# Patient Record
Sex: Female | Born: 1940 | Race: White | Hispanic: No | Marital: Married | Smoking: Never smoker
Health system: Southern US, Community
[De-identification: ages and names within clinical notes are randomized; demographics above are authoritative.]

## PROBLEM LIST (undated history)

## (undated) DIAGNOSIS — R51 Headache: Secondary | ICD-10-CM

## (undated) DIAGNOSIS — R590 Localized enlarged lymph nodes: Principal | ICD-10-CM

## (undated) DIAGNOSIS — I341 Nonrheumatic mitral (valve) prolapse: Secondary | ICD-10-CM

## (undated) DIAGNOSIS — C801 Malignant (primary) neoplasm, unspecified: Secondary | ICD-10-CM

## (undated) DIAGNOSIS — L309 Dermatitis, unspecified: Secondary | ICD-10-CM

## (undated) DIAGNOSIS — E039 Hypothyroidism, unspecified: Secondary | ICD-10-CM

## (undated) DIAGNOSIS — K5790 Diverticulosis of intestine, part unspecified, without perforation or abscess without bleeding: Secondary | ICD-10-CM

## (undated) DIAGNOSIS — K635 Polyp of colon: Secondary | ICD-10-CM

## (undated) DIAGNOSIS — IMO0001 Reserved for inherently not codable concepts without codable children: Secondary | ICD-10-CM

## (undated) DIAGNOSIS — Z8719 Personal history of other diseases of the digestive system: Secondary | ICD-10-CM

## (undated) DIAGNOSIS — M1990 Unspecified osteoarthritis, unspecified site: Secondary | ICD-10-CM

## (undated) DIAGNOSIS — R591 Generalized enlarged lymph nodes: Principal | ICD-10-CM

## (undated) DIAGNOSIS — I10 Essential (primary) hypertension: Secondary | ICD-10-CM

## (undated) DIAGNOSIS — T7840XA Allergy, unspecified, initial encounter: Secondary | ICD-10-CM

## (undated) DIAGNOSIS — M797 Fibromyalgia: Secondary | ICD-10-CM

## (undated) DIAGNOSIS — Z5189 Encounter for other specified aftercare: Secondary | ICD-10-CM

## (undated) DIAGNOSIS — N3010 Interstitial cystitis (chronic) without hematuria: Secondary | ICD-10-CM

## (undated) DIAGNOSIS — C8303 Small cell B-cell lymphoma, intra-abdominal lymph nodes: Principal | ICD-10-CM

## (undated) DIAGNOSIS — K219 Gastro-esophageal reflux disease without esophagitis: Secondary | ICD-10-CM

## (undated) DIAGNOSIS — Z9221 Personal history of antineoplastic chemotherapy: Secondary | ICD-10-CM

## (undated) DIAGNOSIS — K279 Peptic ulcer, site unspecified, unspecified as acute or chronic, without hemorrhage or perforation: Secondary | ICD-10-CM

## (undated) DIAGNOSIS — E7800 Pure hypercholesterolemia, unspecified: Secondary | ICD-10-CM

## (undated) HISTORY — DX: Peptic ulcer, site unspecified, unspecified as acute or chronic, without hemorrhage or perforation: K27.9

## (undated) HISTORY — PX: EYE SURGERY: SHX253

## (undated) HISTORY — DX: Reserved for inherently not codable concepts without codable children: IMO0001

## (undated) HISTORY — PX: TUBAL LIGATION: SHX77

## (undated) HISTORY — DX: Interstitial cystitis (chronic) without hematuria: N30.10

## (undated) HISTORY — DX: Malignant (primary) neoplasm, unspecified: C80.1

## (undated) HISTORY — DX: Pure hypercholesterolemia, unspecified: E78.00

## (undated) HISTORY — DX: Gastro-esophageal reflux disease without esophagitis: K21.9

## (undated) HISTORY — DX: Allergy, unspecified, initial encounter: T78.40XA

## (undated) HISTORY — DX: Small cell b-cell lymphoma, intra-abdominal lymph nodes: C83.03

## (undated) HISTORY — PX: BREAST LUMPECTOMY: SHX2

## (undated) HISTORY — DX: Personal history of antineoplastic chemotherapy: Z92.21

## (undated) HISTORY — DX: Encounter for other specified aftercare: Z51.89

## (undated) HISTORY — DX: Diverticulosis of intestine, part unspecified, without perforation or abscess without bleeding: K57.90

## (undated) HISTORY — DX: Unspecified osteoarthritis, unspecified site: M19.90

## (undated) HISTORY — PX: OTHER SURGICAL HISTORY: SHX169

## (undated) HISTORY — DX: Essential (primary) hypertension: I10

## (undated) HISTORY — PX: INNER EAR SURGERY: SHX679

## (undated) HISTORY — PX: TONSILLECTOMY: SUR1361

## (undated) HISTORY — PX: APPENDECTOMY: SHX54

## (undated) HISTORY — DX: Generalized enlarged lymph nodes: R59.1

## (undated) HISTORY — PX: HYSTEROSCOPY WITH D & C: SHX1775

## (undated) HISTORY — PX: DILATION AND CURETTAGE OF UTERUS: SHX78

## (undated) HISTORY — PX: COLONOSCOPY W/ BIOPSIES AND POLYPECTOMY: SHX1376

## (undated) HISTORY — DX: Localized enlarged lymph nodes: R59.0

## (undated) HISTORY — DX: Polyp of colon: K63.5

## (undated) HISTORY — PX: CATARACT EXTRACTION W/ INTRAOCULAR LENS  IMPLANT, BILATERAL: SHX1307

## (undated) HISTORY — DX: Nonrheumatic mitral (valve) prolapse: I34.1

## (undated) HISTORY — DX: Fibromyalgia: M79.7

## (undated) HISTORY — DX: Hypothyroidism, unspecified: E03.9

---

## 1983-05-18 DIAGNOSIS — IMO0001 Reserved for inherently not codable concepts without codable children: Secondary | ICD-10-CM | POA: Insufficient documentation

## 1997-11-14 DIAGNOSIS — K219 Gastro-esophageal reflux disease without esophagitis: Secondary | ICD-10-CM

## 1998-08-11 ENCOUNTER — Ambulatory Visit (HOSPITAL_BASED_OUTPATIENT_CLINIC_OR_DEPARTMENT_OTHER): Admission: RE | Admit: 1998-08-11 | Discharge: 1998-08-11 | Payer: Self-pay | Admitting: Otolaryngology

## 1998-10-16 DIAGNOSIS — E785 Hyperlipidemia, unspecified: Secondary | ICD-10-CM

## 1998-11-06 ENCOUNTER — Other Ambulatory Visit: Admission: RE | Admit: 1998-11-06 | Discharge: 1998-11-06 | Payer: Self-pay | Admitting: Family Medicine

## 1999-06-17 ENCOUNTER — Encounter: Payer: Self-pay | Admitting: Gastroenterology

## 2000-01-08 ENCOUNTER — Other Ambulatory Visit: Admission: RE | Admit: 2000-01-08 | Discharge: 2000-01-08 | Payer: Self-pay | Admitting: Family Medicine

## 2000-03-17 DIAGNOSIS — M810 Age-related osteoporosis without current pathological fracture: Secondary | ICD-10-CM | POA: Insufficient documentation

## 2001-11-20 ENCOUNTER — Encounter: Payer: Self-pay | Admitting: Family Medicine

## 2001-11-20 ENCOUNTER — Encounter: Admission: RE | Admit: 2001-11-20 | Discharge: 2001-11-20 | Payer: Self-pay | Admitting: Family Medicine

## 2002-10-16 ENCOUNTER — Encounter (INDEPENDENT_AMBULATORY_CARE_PROVIDER_SITE_OTHER): Payer: Self-pay | Admitting: Internal Medicine

## 2002-10-16 ENCOUNTER — Other Ambulatory Visit: Admission: RE | Admit: 2002-10-16 | Discharge: 2002-10-16 | Payer: Self-pay | Admitting: Internal Medicine

## 2003-03-28 ENCOUNTER — Ambulatory Visit (HOSPITAL_COMMUNITY): Admission: RE | Admit: 2003-03-28 | Discharge: 2003-03-28 | Payer: Self-pay | Admitting: Gastroenterology

## 2004-06-02 ENCOUNTER — Ambulatory Visit: Payer: Self-pay | Admitting: Family Medicine

## 2004-06-16 ENCOUNTER — Ambulatory Visit: Payer: Self-pay | Admitting: Ophthalmology

## 2004-12-18 ENCOUNTER — Ambulatory Visit: Payer: Self-pay | Admitting: Family Medicine

## 2005-01-11 ENCOUNTER — Ambulatory Visit: Payer: Self-pay | Admitting: Family Medicine

## 2005-05-27 ENCOUNTER — Ambulatory Visit: Payer: Self-pay | Admitting: Family Medicine

## 2005-07-29 ENCOUNTER — Ambulatory Visit: Payer: Self-pay | Admitting: Family Medicine

## 2005-09-20 ENCOUNTER — Ambulatory Visit: Payer: Self-pay | Admitting: Family Medicine

## 2005-12-10 ENCOUNTER — Ambulatory Visit: Payer: Self-pay | Admitting: Family Medicine

## 2006-01-04 ENCOUNTER — Ambulatory Visit: Payer: Self-pay | Admitting: Family Medicine

## 2006-03-08 ENCOUNTER — Ambulatory Visit: Payer: Self-pay | Admitting: Family Medicine

## 2006-06-03 ENCOUNTER — Ambulatory Visit: Payer: Self-pay | Admitting: Family Medicine

## 2006-06-03 ENCOUNTER — Encounter (INDEPENDENT_AMBULATORY_CARE_PROVIDER_SITE_OTHER): Payer: Self-pay | Admitting: Internal Medicine

## 2006-06-03 LAB — CONVERTED CEMR LAB
Anti Nuclear Antibody(ANA): NEGATIVE
Folate: 17.6 ng/mL
Sed Rate: 10 mm/hr (ref 0–25)
TSH: 1.25 microintl units/mL (ref 0.35–5.50)

## 2006-06-07 ENCOUNTER — Ambulatory Visit: Payer: Self-pay | Admitting: Gastroenterology

## 2006-06-08 ENCOUNTER — Ambulatory Visit: Payer: Self-pay | Admitting: Gastroenterology

## 2006-06-08 ENCOUNTER — Encounter (INDEPENDENT_AMBULATORY_CARE_PROVIDER_SITE_OTHER): Payer: Self-pay | Admitting: Specialist

## 2006-06-08 DIAGNOSIS — K209 Esophagitis, unspecified without bleeding: Secondary | ICD-10-CM | POA: Insufficient documentation

## 2006-06-08 DIAGNOSIS — K222 Esophageal obstruction: Secondary | ICD-10-CM

## 2006-07-11 ENCOUNTER — Ambulatory Visit: Payer: Self-pay | Admitting: Gastroenterology

## 2006-07-11 ENCOUNTER — Ambulatory Visit (HOSPITAL_COMMUNITY): Admission: RE | Admit: 2006-07-11 | Discharge: 2006-07-11 | Payer: Self-pay | Admitting: Gastroenterology

## 2006-07-19 ENCOUNTER — Ambulatory Visit (HOSPITAL_COMMUNITY): Admission: RE | Admit: 2006-07-19 | Discharge: 2006-07-19 | Payer: Self-pay | Admitting: Gastroenterology

## 2006-07-21 ENCOUNTER — Encounter: Payer: Self-pay | Admitting: Cardiology

## 2006-07-21 ENCOUNTER — Ambulatory Visit: Payer: Self-pay

## 2006-08-10 ENCOUNTER — Ambulatory Visit: Payer: Self-pay | Admitting: Gastroenterology

## 2006-08-10 DIAGNOSIS — K3184 Gastroparesis: Secondary | ICD-10-CM | POA: Insufficient documentation

## 2006-08-22 ENCOUNTER — Ambulatory Visit: Payer: Self-pay | Admitting: Gynecology

## 2006-08-26 ENCOUNTER — Ambulatory Visit: Payer: Self-pay | Admitting: Family Medicine

## 2006-08-29 ENCOUNTER — Encounter: Admission: RE | Admit: 2006-08-29 | Discharge: 2006-08-29 | Payer: Self-pay | Admitting: Gynecology

## 2006-09-27 ENCOUNTER — Ambulatory Visit: Payer: Self-pay | Admitting: Family Medicine

## 2006-09-27 DIAGNOSIS — E039 Hypothyroidism, unspecified: Secondary | ICD-10-CM | POA: Insufficient documentation

## 2006-10-12 ENCOUNTER — Ambulatory Visit: Payer: Self-pay | Admitting: Gastroenterology

## 2006-10-12 LAB — CONVERTED CEMR LAB
AST: 18 units/L (ref 0–37)
Albumin: 4.1 g/dL (ref 3.5–5.2)
Basophils Absolute: 0.1 10*3/uL (ref 0.0–0.1)
CO2: 30 meq/L (ref 19–32)
Creatinine, Ser: 0.7 mg/dL (ref 0.4–1.2)
GFR calc Af Amer: 109 mL/min
Glucose, Bld: 122 mg/dL — ABNORMAL HIGH (ref 70–99)
Hemoglobin: 13.8 g/dL (ref 12.0–15.0)
MCHC: 35 g/dL (ref 30.0–36.0)
Monocytes Absolute: 0.7 10*3/uL (ref 0.2–0.7)
Monocytes Relative: 7.3 % (ref 3.0–11.0)
RBC: 4.28 M/uL (ref 3.87–5.11)
RDW: 12.5 % (ref 11.5–14.6)
Sodium: 143 meq/L (ref 135–145)
Total Bilirubin: 0.8 mg/dL (ref 0.3–1.2)

## 2006-11-10 ENCOUNTER — Ambulatory Visit: Payer: Self-pay | Admitting: Family Medicine

## 2006-11-10 DIAGNOSIS — E7800 Pure hypercholesterolemia, unspecified: Secondary | ICD-10-CM | POA: Insufficient documentation

## 2006-11-16 LAB — CONVERTED CEMR LAB
Cholesterol: 243 mg/dL (ref 0–200)
HDL: 48.6 mg/dL (ref >39.0)
Total CHOL/HDL Ratio: 5
Triglycerides: 201 mg/dL (ref 0–149)
VLDL: 40 mg/dL (ref 0–40)

## 2006-12-26 ENCOUNTER — Ambulatory Visit: Payer: Self-pay | Admitting: Gynecology

## 2007-01-26 ENCOUNTER — Encounter (INDEPENDENT_AMBULATORY_CARE_PROVIDER_SITE_OTHER): Payer: Self-pay | Admitting: *Deleted

## 2007-01-26 ENCOUNTER — Ambulatory Visit: Payer: Self-pay | Admitting: Family Medicine

## 2007-01-26 LAB — CONVERTED CEMR LAB
Bacteria, UA: 0
Bilirubin Urine: NEGATIVE
Nitrite: NEGATIVE
Protein, U semiquant: NEGATIVE
RBC / HPF: 0
Specific Gravity, Urine: 1.025

## 2007-02-28 ENCOUNTER — Ambulatory Visit: Payer: Self-pay | Admitting: Family Medicine

## 2007-05-23 ENCOUNTER — Ambulatory Visit: Payer: Self-pay | Admitting: Obstetrics & Gynecology

## 2007-05-26 ENCOUNTER — Ambulatory Visit (HOSPITAL_COMMUNITY): Admission: RE | Admit: 2007-05-26 | Discharge: 2007-05-26 | Payer: Self-pay | Admitting: Gynecology

## 2007-05-29 ENCOUNTER — Telehealth (INDEPENDENT_AMBULATORY_CARE_PROVIDER_SITE_OTHER): Payer: Self-pay | Admitting: *Deleted

## 2007-05-30 ENCOUNTER — Ambulatory Visit: Payer: Self-pay | Admitting: Family Medicine

## 2007-05-30 ENCOUNTER — Encounter: Payer: Self-pay | Admitting: Obstetrics & Gynecology

## 2007-06-13 ENCOUNTER — Ambulatory Visit: Payer: Self-pay | Admitting: Family Medicine

## 2007-06-13 DIAGNOSIS — M129 Arthropathy, unspecified: Secondary | ICD-10-CM | POA: Insufficient documentation

## 2007-06-14 LAB — CONVERTED CEMR LAB: TSH: 0.5 microintl units/mL (ref 0.35–5.50)

## 2007-06-21 ENCOUNTER — Encounter: Payer: Self-pay | Admitting: Internal Medicine

## 2007-06-21 DIAGNOSIS — H9190 Unspecified hearing loss, unspecified ear: Secondary | ICD-10-CM | POA: Insufficient documentation

## 2007-06-21 DIAGNOSIS — C189 Malignant neoplasm of colon, unspecified: Secondary | ICD-10-CM | POA: Insufficient documentation

## 2007-06-26 ENCOUNTER — Ambulatory Visit: Payer: Self-pay | Admitting: Obstetrics & Gynecology

## 2007-07-12 ENCOUNTER — Ambulatory Visit: Payer: Self-pay | Admitting: Family Medicine

## 2007-07-12 ENCOUNTER — Telehealth (INDEPENDENT_AMBULATORY_CARE_PROVIDER_SITE_OTHER): Payer: Self-pay | Admitting: Internal Medicine

## 2007-07-13 ENCOUNTER — Ambulatory Visit: Payer: Self-pay | Admitting: Family Medicine

## 2007-07-13 DIAGNOSIS — H60399 Other infective otitis externa, unspecified ear: Secondary | ICD-10-CM | POA: Insufficient documentation

## 2007-07-13 LAB — CONVERTED CEMR LAB: TSH: 1.94 microintl units/mL (ref 0.35–5.50)

## 2007-07-18 ENCOUNTER — Ambulatory Visit: Payer: Self-pay | Admitting: Family Medicine

## 2007-07-18 ENCOUNTER — Ambulatory Visit: Payer: Self-pay | Admitting: Obstetrics & Gynecology

## 2007-07-18 ENCOUNTER — Ambulatory Visit (HOSPITAL_COMMUNITY): Admission: RE | Admit: 2007-07-18 | Discharge: 2007-07-18 | Payer: Self-pay | Admitting: Obstetrics & Gynecology

## 2007-07-18 ENCOUNTER — Encounter (INDEPENDENT_AMBULATORY_CARE_PROVIDER_SITE_OTHER): Payer: Self-pay | Admitting: Internal Medicine

## 2007-07-18 ENCOUNTER — Encounter: Payer: Self-pay | Admitting: Obstetrics & Gynecology

## 2007-08-14 ENCOUNTER — Ambulatory Visit: Payer: Self-pay | Admitting: Obstetrics & Gynecology

## 2007-10-31 DIAGNOSIS — K5732 Diverticulitis of large intestine without perforation or abscess without bleeding: Secondary | ICD-10-CM | POA: Insufficient documentation

## 2007-12-08 ENCOUNTER — Ambulatory Visit: Payer: Self-pay | Admitting: Family Medicine

## 2008-03-07 ENCOUNTER — Ambulatory Visit: Payer: Self-pay | Admitting: Family Medicine

## 2008-07-01 ENCOUNTER — Ambulatory Visit: Payer: Self-pay | Admitting: Unknown Physician Specialty

## 2008-07-02 ENCOUNTER — Ambulatory Visit: Payer: Self-pay | Admitting: Family Medicine

## 2008-07-02 DIAGNOSIS — D126 Benign neoplasm of colon, unspecified: Secondary | ICD-10-CM

## 2008-07-09 ENCOUNTER — Telehealth (INDEPENDENT_AMBULATORY_CARE_PROVIDER_SITE_OTHER): Payer: Self-pay | Admitting: Internal Medicine

## 2008-07-10 ENCOUNTER — Ambulatory Visit: Payer: Self-pay | Admitting: Internal Medicine

## 2008-07-11 ENCOUNTER — Encounter (INDEPENDENT_AMBULATORY_CARE_PROVIDER_SITE_OTHER): Payer: Self-pay | Admitting: Internal Medicine

## 2008-07-11 ENCOUNTER — Telehealth (INDEPENDENT_AMBULATORY_CARE_PROVIDER_SITE_OTHER): Payer: Self-pay | Admitting: Internal Medicine

## 2008-07-11 LAB — CONVERTED CEMR LAB
ALT: 18 U/L
AST: 18 U/L
BUN: 10 mg/dL
CO2: 29 meq/L
Calcium: 9.2 mg/dL
Chloride: 108 meq/L
Cholesterol: 237 mg/dL
Creatinine, Ser: 0.7 mg/dL
Direct LDL: 136 mg/dL
GFR calc Af Amer: 108 mL/min
GFR calc non Af Amer: 89 mL/min
Glucose, Bld: 93 mg/dL
HDL: 52.4 mg/dL
Potassium: 4.3 meq/L
Sodium: 144 meq/L
TSH: 1.04 u[IU]/mL
Total CHOL/HDL Ratio: 4.5
Triglycerides: 229 mg/dL
VLDL: 46 mg/dL — ABNORMAL HIGH

## 2008-08-06 ENCOUNTER — Ambulatory Visit: Payer: Self-pay | Admitting: Family Medicine

## 2008-08-06 ENCOUNTER — Telehealth (INDEPENDENT_AMBULATORY_CARE_PROVIDER_SITE_OTHER): Payer: Self-pay | Admitting: Internal Medicine

## 2008-08-07 ENCOUNTER — Telehealth (INDEPENDENT_AMBULATORY_CARE_PROVIDER_SITE_OTHER): Payer: Self-pay | Admitting: Internal Medicine

## 2008-08-13 ENCOUNTER — Ambulatory Visit: Payer: Self-pay | Admitting: Unknown Physician Specialty

## 2008-10-15 ENCOUNTER — Ambulatory Visit: Payer: Self-pay | Admitting: Family Medicine

## 2008-10-15 DIAGNOSIS — M545 Low back pain, unspecified: Secondary | ICD-10-CM | POA: Insufficient documentation

## 2008-10-15 LAB — CONVERTED CEMR LAB
Bilirubin Urine: NEGATIVE
Blood in Urine, dipstick: NEGATIVE
Ketones, urine, test strip: NEGATIVE
Urobilinogen, UA: 0.2
pH: 6

## 2008-10-16 ENCOUNTER — Encounter: Payer: Self-pay | Admitting: Family Medicine

## 2008-10-16 DIAGNOSIS — R300 Dysuria: Secondary | ICD-10-CM

## 2008-10-17 ENCOUNTER — Encounter: Payer: Self-pay | Admitting: Family Medicine

## 2008-11-06 ENCOUNTER — Ambulatory Visit: Payer: Self-pay | Admitting: Internal Medicine

## 2008-11-06 DIAGNOSIS — R1310 Dysphagia, unspecified: Secondary | ICD-10-CM | POA: Insufficient documentation

## 2008-11-08 ENCOUNTER — Telehealth (INDEPENDENT_AMBULATORY_CARE_PROVIDER_SITE_OTHER): Payer: Self-pay | Admitting: Internal Medicine

## 2008-12-09 ENCOUNTER — Ambulatory Visit: Payer: Self-pay | Admitting: Gastroenterology

## 2008-12-12 ENCOUNTER — Encounter: Payer: Self-pay | Admitting: Gastroenterology

## 2008-12-12 ENCOUNTER — Ambulatory Visit: Payer: Self-pay | Admitting: Gastroenterology

## 2008-12-24 ENCOUNTER — Encounter: Payer: Self-pay | Admitting: Gastroenterology

## 2008-12-26 ENCOUNTER — Encounter: Payer: Self-pay | Admitting: Gastroenterology

## 2008-12-26 ENCOUNTER — Ambulatory Visit: Payer: Self-pay | Admitting: Gastroenterology

## 2008-12-30 ENCOUNTER — Encounter: Payer: Self-pay | Admitting: Gastroenterology

## 2008-12-31 ENCOUNTER — Ambulatory Visit: Payer: Self-pay | Admitting: Family Medicine

## 2008-12-31 DIAGNOSIS — N390 Urinary tract infection, site not specified: Secondary | ICD-10-CM

## 2008-12-31 LAB — CONVERTED CEMR LAB
Bilirubin Urine: NEGATIVE
Glucose, Urine, Semiquant: NEGATIVE
Urobilinogen, UA: 0.2

## 2009-01-01 ENCOUNTER — Encounter (INDEPENDENT_AMBULATORY_CARE_PROVIDER_SITE_OTHER): Payer: Self-pay | Admitting: Internal Medicine

## 2009-01-08 ENCOUNTER — Telehealth (INDEPENDENT_AMBULATORY_CARE_PROVIDER_SITE_OTHER): Payer: Self-pay | Admitting: Internal Medicine

## 2009-01-21 ENCOUNTER — Ambulatory Visit: Payer: Self-pay | Admitting: Family Medicine

## 2009-01-21 LAB — CONVERTED CEMR LAB
Bilirubin Urine: NEGATIVE
Glucose, Urine, Semiquant: NEGATIVE
Protein, U semiquant: 100
Specific Gravity, Urine: 1.02
pH: 6

## 2009-01-22 ENCOUNTER — Encounter (INDEPENDENT_AMBULATORY_CARE_PROVIDER_SITE_OTHER): Payer: Self-pay | Admitting: Internal Medicine

## 2009-01-28 ENCOUNTER — Telehealth (INDEPENDENT_AMBULATORY_CARE_PROVIDER_SITE_OTHER): Payer: Self-pay | Admitting: Internal Medicine

## 2009-02-07 ENCOUNTER — Ambulatory Visit: Payer: Self-pay | Admitting: Family Medicine

## 2009-02-08 ENCOUNTER — Encounter (INDEPENDENT_AMBULATORY_CARE_PROVIDER_SITE_OTHER): Payer: Self-pay | Admitting: Internal Medicine

## 2009-05-20 ENCOUNTER — Ambulatory Visit: Payer: Self-pay | Admitting: Family Medicine

## 2009-06-23 ENCOUNTER — Telehealth: Payer: Self-pay | Admitting: Gastroenterology

## 2009-06-24 ENCOUNTER — Ambulatory Visit: Payer: Self-pay | Admitting: Gastroenterology

## 2009-06-24 DIAGNOSIS — R1319 Other dysphagia: Secondary | ICD-10-CM

## 2009-06-24 DIAGNOSIS — K5730 Diverticulosis of large intestine without perforation or abscess without bleeding: Secondary | ICD-10-CM | POA: Insufficient documentation

## 2009-06-24 DIAGNOSIS — Z860100 Personal history of colon polyps, unspecified: Secondary | ICD-10-CM | POA: Insufficient documentation

## 2009-06-25 ENCOUNTER — Ambulatory Visit: Payer: Self-pay | Admitting: Pulmonary Disease

## 2009-06-25 ENCOUNTER — Telehealth (INDEPENDENT_AMBULATORY_CARE_PROVIDER_SITE_OTHER): Payer: Self-pay | Admitting: *Deleted

## 2009-06-25 DIAGNOSIS — R059 Cough, unspecified: Secondary | ICD-10-CM | POA: Insufficient documentation

## 2009-06-25 DIAGNOSIS — R05 Cough: Secondary | ICD-10-CM

## 2009-06-26 ENCOUNTER — Ambulatory Visit (HOSPITAL_COMMUNITY): Admission: RE | Admit: 2009-06-26 | Discharge: 2009-06-26 | Payer: Self-pay | Admitting: Gastroenterology

## 2009-06-27 ENCOUNTER — Telehealth: Payer: Self-pay | Admitting: Gastroenterology

## 2009-07-16 ENCOUNTER — Ambulatory Visit: Payer: Self-pay | Admitting: Gastroenterology

## 2009-08-12 ENCOUNTER — Ambulatory Visit: Payer: Self-pay | Admitting: Family Medicine

## 2009-08-12 LAB — CONVERTED CEMR LAB: Vit D, 25-Hydroxy: 28 ng/mL — ABNORMAL LOW (ref 30–89)

## 2009-08-14 ENCOUNTER — Encounter (INDEPENDENT_AMBULATORY_CARE_PROVIDER_SITE_OTHER): Payer: Self-pay | Admitting: *Deleted

## 2009-08-14 LAB — CONVERTED CEMR LAB
ALT: 20 units/L (ref 0–35)
AST: 17 units/L (ref 0–37)
BUN: 13 mg/dL (ref 6–23)
CO2: 31 meq/L (ref 19–32)
Chloride: 107 meq/L (ref 96–112)
Creatinine, Ser: 0.7 mg/dL (ref 0.4–1.2)
Potassium: 4.4 meq/L (ref 3.5–5.1)
Total Bilirubin: 0.5 mg/dL (ref 0.3–1.2)
Total CHOL/HDL Ratio: 5
Total Protein: 7 g/dL (ref 6.0–8.3)
VLDL: 62.2 mg/dL — ABNORMAL HIGH (ref 0.0–40.0)

## 2009-08-19 ENCOUNTER — Telehealth: Payer: Self-pay | Admitting: Family Medicine

## 2009-10-02 ENCOUNTER — Ambulatory Visit: Payer: Self-pay | Admitting: Family Medicine

## 2009-10-02 LAB — CONVERTED CEMR LAB
Bilirubin Urine: NEGATIVE
Glucose, Urine, Semiquant: NEGATIVE
Ketones, urine, test strip: NEGATIVE
Protein, U semiquant: NEGATIVE
pH: 6

## 2009-10-03 ENCOUNTER — Encounter: Payer: Self-pay | Admitting: Family Medicine

## 2009-10-10 ENCOUNTER — Telehealth: Payer: Self-pay | Admitting: Family Medicine

## 2009-10-14 ENCOUNTER — Telehealth: Payer: Self-pay | Admitting: Family Medicine

## 2009-10-14 ENCOUNTER — Encounter (INDEPENDENT_AMBULATORY_CARE_PROVIDER_SITE_OTHER): Payer: Self-pay | Admitting: *Deleted

## 2009-11-13 ENCOUNTER — Encounter (INDEPENDENT_AMBULATORY_CARE_PROVIDER_SITE_OTHER): Payer: Self-pay | Admitting: *Deleted

## 2009-11-24 ENCOUNTER — Telehealth (INDEPENDENT_AMBULATORY_CARE_PROVIDER_SITE_OTHER): Payer: Self-pay | Admitting: *Deleted

## 2009-11-25 ENCOUNTER — Ambulatory Visit: Payer: Self-pay | Admitting: Family Medicine

## 2009-11-25 DIAGNOSIS — E559 Vitamin D deficiency, unspecified: Secondary | ICD-10-CM | POA: Insufficient documentation

## 2009-11-27 LAB — CONVERTED CEMR LAB
AST: 21 units/L (ref 0–37)
Albumin: 4.1 g/dL (ref 3.5–5.2)
Alkaline Phosphatase: 67 units/L (ref 39–117)
Cholesterol: 242 mg/dL — ABNORMAL HIGH (ref 0–200)
Total Protein: 6.4 g/dL (ref 6.0–8.3)
Triglycerides: 190 mg/dL — ABNORMAL HIGH (ref 0.0–149.0)

## 2009-12-25 ENCOUNTER — Ambulatory Visit: Payer: Self-pay | Admitting: Family Medicine

## 2009-12-25 LAB — CONVERTED CEMR LAB
Nitrite: NEGATIVE
Protein, U semiquant: 30
Urobilinogen, UA: 0.2

## 2010-01-06 ENCOUNTER — Ambulatory Visit: Payer: Self-pay | Admitting: Family Medicine

## 2010-01-06 LAB — CONVERTED CEMR LAB
Blood in Urine, dipstick: NEGATIVE
Nitrite: NEGATIVE
Protein, U semiquant: NEGATIVE
Urobilinogen, UA: 0.2
WBC Urine, dipstick: NEGATIVE

## 2010-01-09 ENCOUNTER — Telehealth: Payer: Self-pay | Admitting: Family Medicine

## 2010-01-28 ENCOUNTER — Ambulatory Visit: Payer: Self-pay | Admitting: Family Medicine

## 2010-01-28 DIAGNOSIS — N63 Unspecified lump in unspecified breast: Secondary | ICD-10-CM

## 2010-01-30 ENCOUNTER — Encounter: Payer: Self-pay | Admitting: Family Medicine

## 2010-02-02 ENCOUNTER — Encounter: Payer: Self-pay | Admitting: Family Medicine

## 2010-02-02 ENCOUNTER — Ambulatory Visit: Payer: Self-pay | Admitting: Family Medicine

## 2010-02-04 ENCOUNTER — Telehealth: Payer: Self-pay | Admitting: Family Medicine

## 2010-02-09 ENCOUNTER — Encounter: Payer: Self-pay | Admitting: Family Medicine

## 2010-02-10 ENCOUNTER — Encounter: Payer: Self-pay | Admitting: Family Medicine

## 2010-02-10 ENCOUNTER — Ambulatory Visit: Payer: Self-pay | Admitting: Family Medicine

## 2010-02-10 DIAGNOSIS — R928 Other abnormal and inconclusive findings on diagnostic imaging of breast: Secondary | ICD-10-CM | POA: Insufficient documentation

## 2010-02-11 ENCOUNTER — Encounter: Payer: Self-pay | Admitting: Family Medicine

## 2010-02-16 ENCOUNTER — Encounter: Payer: Self-pay | Admitting: Family Medicine

## 2010-02-20 ENCOUNTER — Ambulatory Visit: Payer: Self-pay | Admitting: Rheumatology

## 2010-03-02 ENCOUNTER — Other Ambulatory Visit: Admission: RE | Admit: 2010-03-02 | Discharge: 2010-03-02 | Payer: Self-pay | Admitting: Obstetrics & Gynecology

## 2010-03-02 ENCOUNTER — Ambulatory Visit: Payer: Self-pay | Admitting: Obstetrics & Gynecology

## 2010-05-26 ENCOUNTER — Telehealth: Payer: Self-pay | Admitting: Family Medicine

## 2010-06-09 ENCOUNTER — Ambulatory Visit
Admission: RE | Admit: 2010-06-09 | Discharge: 2010-06-09 | Payer: Self-pay | Source: Home / Self Care | Attending: Family Medicine | Admitting: Family Medicine

## 2010-06-09 LAB — CONVERTED CEMR LAB
Glucose, Urine, Semiquant: NEGATIVE
Protein, U semiquant: NEGATIVE
WBC Urine, dipstick: NEGATIVE
pH: 5

## 2010-06-10 ENCOUNTER — Encounter: Payer: Self-pay | Admitting: Family Medicine

## 2010-06-16 NOTE — Progress Notes (Signed)
  Phone Note Call from Patient   Caller: Patient Call For: Ruthe Mannan MD Summary of Call: Patient never was able to take the statin  drug you put her on. Does she need to come in for labs tomorrow?Vit D 268.9; Lipid; Hepatic 272.4/lsf is what she has ordered. Thanks. # to return call [phone] Initial call taken by: Mills Koller,  November 24, 2009 8:19 AM  Follow-up for Phone Call        Yes, we need to see where her lipids are currently so we know what to try next. thanks! Ruthe Mannan MD  November 24, 2009 8:25 AM   Additional Follow-up for Phone Call Additional follow up Details #1::        Patient notified Additional Follow-up by: Mills Koller,  November 24, 2009 8:33 AM

## 2010-06-16 NOTE — Assessment & Plan Note (Signed)
Summary: F/U FROM APPT. 06-24-09 W/AMY, BA ESOPHAGRAM AND NEED FOR BRAVO...    History of Present Illness Visit Type: Follow-up Visit Primary GI MD: Melvia Heaps MD [facility] Primary Provider: Laurita Quint, MD Requesting Provider: n/a Chief Complaint: F/U abdominal pain has not changed History of Present Illness:   Amanda Kane has returned for ongoing evaluation of burning chest and abdominal discomfort.  On b.i.d. Prevacid symptoms have improved though they remain.  She has frequent breakthrough pyrosis and burning upper epigastric pain.  She has also been complaining of a chronic nonproductive cough which she introduced allergies.  Once she gets coughing it becomes a cyclical problem.  a barium swallow, which I reviewed, did not demonstrate any strictures.  She does complain of occasional dysphagia.  She was last dilated in July, 2010.  In contrast to prior dilatations, her dysphagia did not significantly improve following her last dilatation.   GI Review of Systems    Reports abdominal pain and  acid reflux.     Location of  Abdominal pain: right side.    Denies belching, bloating, chest pain, dysphagia with liquids, dysphagia with solids, heartburn, loss of appetite, nausea, vomiting, vomiting blood, weight loss, and  weight gain.      Reports change in bowel habits and  constipation.     Denies anal fissure, black tarry stools, diarrhea, diverticulosis, fecal incontinence, heme positive stool, hemorrhoids, irritable bowel syndrome, jaundice, light color stool, liver problems, rectal bleeding, and  rectal pain.    Current Medications (verified): 1)  Prozac 20 Mg  Caps (Fluoxetine Hcl) .... Take One By Mouth Every Other Day As Needed For Fibromyalgia 2)  Estrace 0.1 Mg/gm Crea (Estradiol) .Marland Kitchen.. 1 Time Per Week Vaginally 3)  Pyridium 100 Mg Tabs (Phenazopyridine Hcl) .... Per Urologist As Needed 4)  Levothyroxine Sodium 75 Mcg Tabs (Levothyroxine Sodium) .Marland Kitchen.. 1 Once Daily For Thyroid 5)   Hyoscyamine Sulfate 0.125 Mg  Subl (Hyoscyamine Sulfate) .... Take 2 Sl As Needed Abd Pain/cramping 6)  Tylenol Pm Extra Strength 500-25 Mg Tabs (Diphenhydramine-Apap (Sleep)) .... Take One By Mouth At Bedtime 7)  Prevacid 30 Mg Cpdr (Lansoprazole) .... Take 1 Tab Twice Daily 8)  Tessalon Perles 100 Mg  Caps (Benzonatate) .... Two By Mouth Every 6 Hrs If Needed. 9)  Tussicaps 10-8 Mg Xr12h-Cap (Hydrocod Polst-Chlorphen Polst) .... One Every 12hours If Needed. 10)  Multivitamins  Tabs (Multiple Vitamin) .... Once Daily  Allergies (verified): 1)  ! Cipro (Ciprofloxacin Hcl) 2)  ! Flagyl (Metronidazole) 3)  ! Sulfa 4)  ! Lipitor (Atorvastatin Calcium) 5)  ! Celebrex (Celecoxib) 6)  ! Zocor (Simvastatin) 7)  ! Lyrica (Pregabalin) 8)  ! Zetia (Ezetimibe)  Past History:  Past Medical History: Reviewed history from 06/24/2009 and no changes required. Diverticulosis, colon (05/17/1996) REMOTE COLON POLYPS GERD (11/14/1997) Hyperlipidemia (10/16/1998) Osteoporosis (03/17/2000) HYPOTHYROID FIBROMYALGIA  Past Surgical History: Reviewed history from 06/30/2009 and no changes required. appy                                         BTL /77 Colonoscopy Divertics Polyp (malignant) 1980 stapedectomy bilateral colonoscopy diverticuli neg polyps 05/1999 breast biopsy x 4  /94-98 dexa 11/01 EGD HH stricture dilated 03/28/03 bilateral cataract EGD 06/08/06 cysto 1/04 2D ECHO neg no MVP at rest 3/03 CT abd 11/98 D&C 06/2007 Colonoscopy Sm Polyp Mod Divertics (Dr Arlyce Dice) 12/26/08  5 yrs Esophagram/Barium Swallow/Barium Tab Study  NML  06/26/2009    [facility]--rheumatiology [place]--urology Chosen Garron--[facility]--GYN         Arlyce Dice --GI [place]--Rheumatologist [facility]--urology [place]--GYN Duda--ortho  Family History: Reviewed history from 06/24/2009 and no changes required. Father dec 86 Stroke  Mother A 9 Htn Multip Mini strokes Brother dec 56 MI (2009) Brother A  28 Sister A 67 Chol Htn No FH of Colon Cancer  Social History: Reviewed history from 06/25/2009 and no changes required. Marital Status: Married Children:  Occupation: retired Runner, broadcasting/film/video.  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  Alcohol Use - yes wine nightly  Review of Systems       The patient complains of allergy/sinus, arthritis/joint pain, back pain, cough, fatigue, headaches-new, hearing problems, itching, muscle pains/cramps, shortness of breath, skin rash, sleeping problems, sore throat, and swollen lymph glands.  The patient denies anemia, anxiety-new, blood in urine, breast changes/lumps, change in vision, confusion, depression-new, fainting, fever, heart murmur, heart rhythm changes, menstrual pain, night sweats, nosebleeds, pregnancy symptoms, swelling of feet/legs, thirst - excessive , urination - excessive , urination changes/pain, urine leakage, vision changes, and voice change.    Vital Signs:  Patient profile:   70 year old female Height:      61 inches Weight:      154.25 pounds BMI:     29.25 Pulse rate:   60 / minute Pulse rhythm:   regular BP sitting:   158 / 90  (right arm) Cuff size:   regular  Vitals Entered By: June McMurray CMA [facility] Dull) (July 16, 2009 9:49 AM)   Impression & Recommendations:  Problem # 1:  GERD (ICD-530.81) Patient remains symptomatic despite high-dose PPI therapy.  I discussed the possible indications for fundoplication and that she may want to consider this option.  I also recommended that she have a 48-hour bravo pH probe while on PPI therapy to document that she has ongoing acid reflux.  At this time she does not wish to have another procedure and would like to try [facility] as an alternative.  Accordingly, she will take DEXILANT 60 mg one to 2 times a day and return for reevaluation.  Problem # 2:  DYSPHAGIA (ICD-787.29) She has mild persistent symptoms without an  obvious stricture by barium swallow raising the question of a motility disorder.  Recommendations #1 to consider esophageal manometry, especially if a fundoplication is anticipated.  Patient Instructions: 1)  Take Dexilant one by mouth once daily. 2)  Copy sent to : Laurita Quint, MD 3)  The medication list was reviewed and reconciled.  All changed / newly prescribed medications were explained.  A complete medication list was provided to the patient / caregiver.

## 2010-06-16 NOTE — Consult Note (Signed)
Summary: [facility]  [facility]   Imported By: Lanelle Bal 03/05/2010 12:23:33  _____________________________________________________________________  External Attachment:    Type:   Image     Comment:   External Document

## 2010-06-16 NOTE — Assessment & Plan Note (Signed)
Summary: WORSENING INDIGESTION, CHEST PRESSURE AND DYSPHAGIA   (DR.KAP...    History of Present Illness Visit Type: Follow-up Visit Primary GI MD: Melvia Heaps MD [facility] Primary Provider: Laurita Quint, MD Requesting Provider: n/a Chief Complaint: Patient complains of worsning gerd and choking which has been getting worse in the past month. She is having some nausea and headaches. She complains that the last time she had a dilation done there was no improvement with her dysphagia.  History of Present Illness:   70 YO FEMALE KNOWN TO DR. KAPLAN WITH HX OF GERD AND ESOPHAGEAL STICTURE. ALSO HAS HX OF COLON POLYPS AND DIVERTICULOSIS.SHE LAST HAD EGD WITH MALONEY DILATION TO IN 7/10. SHE SAYS HER SXS DID NOT IMPROVE POST DILATION.SHE HAS BEEN SICK OVER THE PAST SEVERAL WEEKS WITH  BRONCHITIS AND A PERSISTENT COUGH,WAS TREATED WITH A COURSE OF CEFTIN BUT SXS DID NOT RESOLVE. SHE RELATES RECURRENT SIMILAR PROLONGED EPISODES. SHE SAYS SHE HAS BEEN HAVING ONGOING PROBLEMS WITH SWALLOWING,CHOKES AND COUGHS SOMETIMES WITH LIQUIDS,HAS MORE DIFFICULTY WITH SOLIDS-JUST WONT GO DOWN. SHE HAS HAD EPISODES OF REGURGITATION. GENERALLY NO HEARTBURN DURING THE DAY,BUT REFLUXES WHEN LAYS DOWN AT NIGHT. SHE WILL GET UP IN THE MIDDLE OF THE NIGHT TO EAT CEREAL BECAUSE HER STOMACH IS BURNING.SHE ALSO C/O ACHING FEELING IN HER CHEST,NECK,ESOPHAGUS FOR MONTHS.   GI Review of Systems    Reports acid reflux, belching, chest pain, dysphagia with liquids, dysphagia with solids, and  heartburn.      Denies abdominal pain, bloating, loss of appetite, nausea, vomiting, vomiting blood, and  weight loss.        Denies anal fissure, black tarry stools, change in bowel habit, constipation, diarrhea, diverticulosis, fecal incontinence, heme positive stool, hemorrhoids, irritable bowel syndrome, jaundice, light color stool, liver problems, rectal bleeding, and  rectal pain.    Current Medications (verified): 1)  Nicomide  750-25-1.5-0.5 Mg  Tabs (Niacinamide-Zinc-Copper-Fa) .... Take One By Mouth Once A Day As Needed 2)  Rosac 10-5 %  Crea (Sulfacetamide-Sulfur-Sunscreen) .... Use As Needed 3)  Prozac 20 Mg  Caps (Fluoxetine Hcl) .... Take One By Mouth Every Other Day As Needed For Fibromyalgia 4)  Elmiron 100 Mg Caps (Pentosan Polysulfate Sodium) .... Per Urologist As Needed 5)  Estrace 0.1 Mg/gm Crea (Estradiol) .Marland Kitchen.. 1 Time Per Week Vaginally 6)  Pyridium 100 Mg Tabs (Phenazopyridine Hcl) .... Per Urologist As Needed 7)  Levothyroxine Sodium 75 Mcg Tabs (Levothyroxine Sodium) .Marland Kitchen.. 1 Once Daily For Thyroid 8)  Prevacid 30 Mg Cpdr (Lansoprazole) .Marland Kitchen.. 1 Each Morning 30-84min Before Food or Other Fluids 9)  Hyoscyamine Sulfate 0.125 Mg  Subl (Hyoscyamine Sulfate) .... Take 2 Sl As Needed Abd Pain/cramping 10)  Tylenol Pm Extra Strength 500-25 Mg Tabs (Diphenhydramine-Apap (Sleep)) .... Take One By Mouth At Bedtime  Allergies (verified): 1)  ! Cipro (Ciprofloxacin Hcl) 2)  ! Flagyl (Metronidazole) 3)  ! Sulfa 4)  ! Lipitor (Atorvastatin Calcium) 5)  ! Celebrex (Celecoxib) 6)  ! Zocor (Simvastatin) 7)  ! Lyrica (Pregabalin) 8)  ! Zetia (Ezetimibe)  Past History:  Past Medical History: Diverticulosis, colon (05/17/1996) REMOTE COLON POLYPS GERD (11/14/1997) Hyperlipidemia (10/16/1998) Osteoporosis (03/17/2000) HYPOTHYROID FIBROMYALGIA  Past Surgical History: appy                                         BTL /77 Colonoscopy Divertics Polyp (malignant) 1980 stapedectomy bilateral colonoscopy diverticuli neg polyps  05/1999 breast biopsy x 4  /94-98 dexa 11/01 EGD HH stricture dilated 03/28/03 bilateral cataract EGD 06/08/06 cysto 1/04 2D ECHO neg no MVP at rest 3/03 CT abd 11/98 D&C 06/2007 Colonoscopy Sm Polyp Mod Divertics (Dr Arlyce Dice) 12/26/08     5 yrs    [facility]--rheumatiology [facility]--urology [facility]--GI [facility]--GYN         Arlyce Dice  --GI [place]--Rheumatologist [facility]--urology [place]--GYN Duda--ortho  Family History: Reviewed history from 12/09/2008 and no changes required. Father dec 86 Stroke  Mother A 78 Htn Multip Mini strokes Brother dec 56 MI (2009) Brother A 70 Sister A 67 Chol Htn No FH of Colon Cancer  Social History: Marital Status: Married Children:  Occupation: at home Patient has never smoked.  Illicit Drug Use - no Patient does not get regular exercise.  Alcohol Use - yes wine nightly  Review of Systems       The patient complains of allergy/sinus, arthritis/joint pain, back pain, cough, headaches-new, muscle pains/cramps, sleeping problems, urination - excessive, urination changes/pain, and urine leakage.  The patient denies anemia, anxiety-new, blood in urine, breast changes/lumps, change in vision, confusion, coughing up blood, depression-new, fainting, fatigue, fever, hearing problems, heart murmur, heart rhythm changes, itching, menstrual pain, night sweats, nosebleeds, pregnancy symptoms, shortness of breath, skin rash, sore throat, swelling of feet/legs, swollen lymph glands, thirst - excessive , urination - excessive , vision changes, and voice change.         ROS OTHERWISE AS IN HPI  Vital Signs:  Patient profile:   70 year old female Height:      61 inches Weight:      155.8 pounds BMI:     29.54 Pulse rate:   68 / minute Pulse rhythm:   regular BP sitting:   142 / 80  (left arm) Cuff size:   regular  Vitals Entered By: Harlow Mares CMA [facility] Dull) (June 24, 2009 9:08 AM)  Physical Exam  General:  Well developed, well nourished, no acute distress.,COUGHING Head:  Normocephalic and atraumatic. Eyes:  PERRLA, no icterus. Lungs:  Clear throughout to auscultation. Heart:  Regular rate and rhythm; no murmurs, rubs,  or bruits. Abdomen:  SOFT, NONTENDER, NO MASS OR HSM,BS+ Rectal:  NOT DONE Extremities:  No clubbing, cyanosis, edema or deformities noted. Neurologic:  Alert  and  oriented x4;  grossly normal neurologically. Psych:  Alert and cooperative. Normal mood and affect.   Impression & Recommendations:  Problem # 1:  DYSPHAGIA (KGM-010.27) Assessment Deteriorated 70 YO FEMALE WITH PERSISTENT DYSPHAGIA TO SOLIDS AND LIQUIDS,CHOKING,FREQUENT REGURGITATION. NO IMPROVEMENT AFTER LAST DILATION. 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  Patient Instructions: 1)  We made you an appointment with Dr. Shelle Iron for tomrrow 06-25-09 at 9:00 Am. 2)  We scheduled the Baerium test for Thursday 06-26-09 at 9:30. Instructions provided.  3)  We sent a perscription [facility] Prevacid to [facility]. 4)  Copy sent to : Dr. Hetty Ely 5)  GI Reflux brochure given.  Prescriptions: PREVACID 30 MG CPDR (LANSOPRAZOLE) Take 1 tab twice daily  #  60 x 3   Entered by:   Lowry Ram NCMA   Authorized by:   Sammuel Cooper PA-c   Signed by:   Lowry Ram NCMA on 06/24/2009   Method used:   Electronically to        [facility]* (retail)       [address]       [place], [place]  [address]       Ph: [phone]       Fax: [phone]   RxID:   [id number]

## 2010-06-16 NOTE — Miscellaneous (Signed)
Summary: Orders Update   Clinical Lists Changes  Orders: Added new Referral order of Radiology Referral (Radiology) - Signed 

## 2010-06-16 NOTE — Progress Notes (Signed)
  Phone Note From Other Clinic   Caller: Receptionist Summary of Call: Dr Jiles Garter office called to say that Ms Robards cancelled her appt with Dr Kellie Simmering and she did not want to reschedule. they will keep your records in case she wants to reschedule in the future. they just wanted you to know. Initial call taken by: Carlton Adam,  January 09, 2010 4:56 PM  Follow-up for Phone Call        Thank you. Ruthe Mannan MD  January 09, 2010 6:02 PM

## 2010-06-16 NOTE — Assessment & Plan Note (Signed)
Summary: ?UTI/CLE   Vital Signs:  Patient profile:   70 year old female Height:      61 inches Weight:      149.25 pounds BMI:     28.30 Temp:     98.8 degrees F oral Pulse rate:   68 / minute Pulse rhythm:   regular BP sitting:   130 / 90  (right arm) Cuff size:   regular  Vitals Entered By: Linde Gillis CMA [facility] Dull) (December 25, 2009 1:54 PM) CC: ? UTI   History of Present Illness: 70 yo with h/o chronic dysuria presents with two day of worsening dysuria, had a little incontinence this morning.  Has issues with chronic back pain, no worse than usual. No fevers, no vomiting.  PMH:  allergic to sulfa and cipro  Current Medications (verified): 1)  Prozac 20 Mg  Caps (Fluoxetine Hcl) .... Take One By Mouth Every Other Day As Needed For Fibromyalgia 2)  Estrace 0.1 Mg/gm Crea (Estradiol) .Marland Kitchen.. 1 Time Per Week Vaginally 3)  Pyridium 100 Mg Tabs (Phenazopyridine Hcl) .Marland Kitchen.. 1 Tab By Mouth Three Times A Day Prn 4)  Levothyroxine Sodium 75 Mcg Tabs (Levothyroxine Sodium) .Marland Kitchen.. 1 Once Daily For Thyroid 5)  Hyoscyamine Sulfate 0.125 Mg  Subl (Hyoscyamine Sulfate) .... Take 2 Sl As Needed Abd Pain/cramping 6)  Tylenol Pm Extra Strength 500-25 Mg Tabs (Diphenhydramine-Apap (Sleep)) .... Take One By Mouth At Bedtime 7)  Prevacid 30 Mg Cpdr (Lansoprazole) .... Take 1 Tab Twice Daily As Needed 8)  Multivitamins  Tabs (Multiple Vitamin) .... Once Daily 9)  Dexilant 60 Mg Cpdr (Dexlansoprazole) .... Take One By Mouth Once Daily 10)  Elmiron 100 Mg Caps (Pentosan Polysulfate Sodium) .Marland Kitchen.. 1 Tab By Mouth Three Times A Day Taken With Water One Hour Before or 2 Hours After Meals 11)  Vitamin D 1000 Unit  Tabs (Cholecalciferol) .... Take 1 Tablet By Mouth Once A Day 12)  Savella 50 Mg Tabs (Milnacipran Hcl) .... Take 1 Tablet By Mouth Two Times A Day 13)  Keflex 500 Mg Caps (Cephalexin) .Marland Kitchen.. 1 Tab By Mouth Two Times A Day X 10 Days  Allergies: 1)  ! Cipro (Ciprofloxacin Hcl) 2)  ! Flagyl  (Metronidazole) 3)  ! Sulfa 4)  ! Lipitor (Atorvastatin Calcium) 5)  ! Celebrex (Celecoxib) 6)  ! Zocor (Simvastatin) 7)  ! Lyrica (Pregabalin) 8)  ! Zetia (Ezetimibe)  Past History:  Past Medical History: Last updated: 06/24/2009 Diverticulosis, colon (05/17/1996) REMOTE COLON POLYPS GERD (11/14/1997) Hyperlipidemia (10/16/1998) Osteoporosis (03/17/2000) HYPOTHYROID FIBROMYALGIA  Past Surgical History: Last updated: 06/30/2009 appy                                         BTL /77 Colonoscopy Divertics Polyp (malignant) 1980 stapedectomy bilateral colonoscopy diverticuli neg polyps 05/1999 breast biopsy x 4  /94-98 dexa 11/01 EGD HH stricture dilated 03/28/03 bilateral cataract EGD 06/08/06 cysto 1/04 2D ECHO neg no MVP at rest 3/03 CT abd 11/98 D&C 06/2007 Colonoscopy Sm Polyp Mod Divertics (Dr Arlyce Dice) 12/26/08     5 yrs Esophagram/Barium Swallow/Barium Tab Study  NML  06/26/2009    [facility]--rheumatiology [facility]--urology [facility]--GI [facility] Lavone Neri --GI [facility]--Rheumatologist Cope--urology [facility]er--GYN Duda--ortho  Family History: Last updated: 06/24/2009 Father dec 86 Stroke  Mother A 76 Htn Multip Mini strokes Brother dec 56 MI (2009) Brother A 58 Sister  A 67 Chol Htn No FH of Colon Cancer  Social History: Last updated: 06/25/2009 Marital Status: Married Children:  Occupation: retired Runner, broadcasting/film/video.  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  Alcohol Use - yes wine nightly  Risk Factors: Alcohol Use: 1 (07/02/2008) Caffeine Use: 2 (07/02/2008) Exercise: no (12/09/2008)  Risk Factors: Smoking Status: never (12/09/2008) Passive Smoke Exposure: no (07/13/2007)  Review of Systems      See HPI General:  Denies chills and fever. GI:  Denies nausea and vomiting. GU:  Complains of dysuria and incontinence; denies hematuria.  Physical Exam  General:   alert, well-developed, well-nourished, and well-hydrated.  NAD Mouth:  MMM Abdomen:  Bowel sounds positive,abdomen soft and minimally tender globally without masses, organomegaly or hernias noted. NO CVA tenderness Skin:  Intact without suspicious lesions or rashes Psych:  Cognition and judgment appear intact. Alert and cooperative with normal attention span and concentration. No apparent delusions, illusions, hallucinations   Impression & Recommendations:  Problem # 1:  UTI (ICD-599.0) Assessment New  Given h/o recurrent UTI and interstitial cystitis with multiple abx allergies, will send urine for culture. UA pos.  Keflex 500 mg two times a day x 10 days. Her updated medication list for this problem includes:    Pyridium 100 Mg Tabs (Phenazopyridine hcl) .Marland Kitchen... 1 tab by mouth three times a day prn    Keflex 500 Mg Caps (Cephalexin) .Marland Kitchen... 1 tab by mouth two times a day x 10 days  Orders: Prescription Created Electronically [phone])  Complete Medication List: 1)  Prozac 20 Mg Caps (Fluoxetine hcl) .... Take one by mouth every other day as needed for fibromyalgia 2)  Estrace 0.1 Mg/gm Crea (Estradiol) .Marland Kitchen.. 1 time per week vaginally 3)  Pyridium 100 Mg Tabs (Phenazopyridine hcl) .Marland Kitchen.. 1 tab by mouth three times a day prn 4)  Levothyroxine Sodium 75 Mcg Tabs (Levothyroxine sodium) .Marland Kitchen.. 1 once daily for thyroid 5)  Hyoscyamine Sulfate 0.125 Mg Subl (Hyoscyamine sulfate) .... Take 2 sl as needed abd pain/cramping 6)  Tylenol Pm Extra Strength 500-25 Mg Tabs (Diphenhydramine-apap (sleep)) .... Take one by mouth at bedtime 7)  Prevacid 30 Mg Cpdr (Lansoprazole) .... Take 1 tab twice daily as needed 8)  Multivitamins Tabs (Multiple vitamin) .... Once daily 9)  Dexilant 60 Mg Cpdr (Dexlansoprazole) .... Take one by mouth once daily 10)  Elmiron 100 Mg Caps (Pentosan polysulfate sodium) .Marland Kitchen.. 1 tab by mouth three times a day taken with water one hour before or 2 hours after meals 11)  Vitamin D  1000 Unit Tabs (Cholecalciferol) .... Take 1 tablet by mouth once a day 12)  Savella 50 Mg Tabs (Milnacipran hcl) .... Take 1 tablet by mouth two times a day 13)  Keflex 500 Mg Caps (Cephalexin) .Marland Kitchen.. 1 tab by mouth two times a day x 10 days  Other Orders: UA Dipstick w/o Micro (manual) ([id number]) T-Culture, Urine ([id number]) Prescriptions: KEFLEX 500 MG CAPS (CEPHALEXIN) 1 tab by mouth two times a day x 10 days  #20 x 0   Entered and Authorized by:   Ruthe Mannan MD   Signed by:   Ruthe Mannan MD on 12/25/2009   Method used:   Electronically to        [facility]* (retail)       [address]       [place], [place]  [address]       Ph: [phone]  Fax: [phone]   RxID:   [id number]   Current Allergies (reviewed today): ! CIPRO (CIPROFLOXACIN HCL) ! FLAGYL (METRONIDAZOLE) ! SULFA ! LIPITOR (ATORVASTATIN CALCIUM) ! CELEBREX (CELECOXIB) ! ZOCOR (SIMVASTATIN) ! LYRICA (PREGABALIN) ! ZETIA (EZETIMIBE)  Laboratory Results   Urine Tests  Date/Time Received: December 25, 2009 2:04 PM   Routine Urinalysis   Color: yellow Appearance: Cloudy Glucose: negative   (Normal Range: Negative) Bilirubin: negative   (Normal Range: Negative) Ketone: negative   (Normal Range: Negative) Spec. Gravity: 1.015   (Normal Range: 1.003-1.035) Blood: large   (Normal Range: Negative) pH: 6.5   (Normal Range: 5.0-8.0) Protein: 30   (Normal Range: Negative) Urobilinogen: 0.2   (Normal Range: 0-1) Nitrite: negative   (Normal Range: Negative) Leukocyte Esterace: large   (Normal Range: Negative)

## 2010-06-16 NOTE — Miscellaneous (Signed)
Summary: Orders Update   Clinical Lists Changes  Problems: Added new problem of MAMMOGRAM, ABNORMAL, LEFT (ICD-793.80) Orders: Added new Referral order of Radiology Referral (Radiology) - Signed 

## 2010-06-16 NOTE — Progress Notes (Signed)
Summary: refill request for levothyroxine  Phone Note Refill Request Message from:  Fax from Pharmacy  Refills Requested: Medication #1:  LEVOTHYROXINE SODIUM 75 MCG TABS 1 once daily for thyroid   Last Refilled: 05/05/2009 Faxed form from [place] is on your desk.  Initial call taken by: Lowella Petties CMA,  August 19, 2009 12:55 PM  Follow-up for Phone Call        On my desk. Follow-up by: Ruthe Mannan MD,  August 20, 2009 7:30 AM  Additional Follow-up for Phone Call Additional follow up Details #1::        Faxed Additional Follow-up by: Delilah Shan CMA [facility]),  August 20, 2009 11:10 AM

## 2010-06-16 NOTE — Assessment & Plan Note (Signed)
Summary: COUGH/CLE   Vital Signs:  Patient profile:   70 year old female Height:      61 inches Weight:      153.25 pounds BMI:     29.06 Temp:     99.2 degrees F oral Pulse rate:   88 / minute Pulse rhythm:   regular Resp:     24 per minute BP sitting:   144 / 96  (left arm) Cuff size:   regular  Vitals Entered By: Lewanda Rife LPN (May 20, 2009 12:25 PM)  Primary Care Provider:  Elpidio Eric, MD  CC:  Started 05/10/09, productive cough with dark yellow mucus, after hard cough last night rt shoulder and arm began to hurt and tingle also with hard cough last night difficult for pt to control urine when coughs, and headache and dizzy.  History of Present Illness: Here for productive cough, onset x 05/10/2009, nasal congestion, chills/no fever, able to sleep --taken musinex, cough syrup--Vicks,   Developed pain in R shoulder on waking today and down arm, had  severe cough last night --lost bladder control, fell to knees last night with cough --today shoulder aching, tingling in fingertips  Allergies: 1)  ! Cipro (Ciprofloxacin Hcl) 2)  ! Flagyl (Metronidazole) 3)  ! Sulfa 4)  ! Lipitor (Atorvastatin Calcium) 5)  ! Celebrex (Celecoxib) 6)  ! Zocor (Simvastatin) 7)  ! Lyrica (Pregabalin) 8)  ! Zetia (Ezetimibe)  Past History:  Past Medical History: Reviewed history from 06/21/2007 and no changes required. Diverticulosis, colon (05/17/1996) GERD (11/14/1997) Hyperlipidemia (10/16/1998) Osteoporosis (03/17/2000)  Review of Systems ENT:  Complains of nasal congestion and postnasal drainage; denies sore throat. CV:  Denies chest pain or discomfort and palpitations. Resp:  Complains of cough; denies shortness of breath and wheezing. GI:  Denies nausea and vomiting. MS:  Complains of muscle aches.  Physical Exam  General:  alert, well-developed, well-nourished, and well-hydrated.  NAD sitting in exam room Ears:  TMs retracted with minimal fluid Nose:  no mucosal  edema, no airflow obstruction, and mucosal erythema.  sinuses +,- Mouth:  no exudates and pharyngeal erythema.   Lungs:  moist harsh cough, no crackles and no wheezes.   Msk:  full ROM of R shoulder, elbow, wrists without pain decreased ROM of neck in all positions except flexion Neurologic:  alert & oriented X3, sensation intact to light touch, and gait normal.   Cervical Nodes:  no anterior cervical adenopathy and no posterior cervical adenopathy.   Psych:  normally interactive, flat affect, and subdued.     Impression & Recommendations:  Problem # 1:  BRONCHITIS-ACUTE (ICD-466.0) Assessment New continue comfort care measures: increase po fluids, rest, tylenol or IBP as needed will start on ceftin two times a day x7d--extend if needed see back if worsends The following medications were removed from the medication list:    Ceftin 500 Mg Tabs (Cefuroxime axetil) .Marland Kitchen... Take 1 two times a day for uti Her updated medication list for this problem includes:    Ceftin 500 Mg Tabs (Cefuroxime axetil) .Marland Kitchen... Take 1 two times a day  Problem # 2:  FIBROMYALGIA (ICD-729.1) Assessment: Deteriorated generalized pain worsened by severe cough see will use her usual pain meds until resolved The following medications were removed from the medication list:    Mobic 7.5 Mg Tabs (Meloxicam) .Marland Kitchen... 1 or 2 once daily// by mouth only as needed  Complete Medication List: 1)  Nicomide 750-25-1.5-0.5 Mg Tabs (Niacinamide-zinc-copper-fa) .... Take one by mouth once  a day as needed 2)  Rosac 10-5 % Crea (Sulfacetamide-sulfur-sunscreen) 3)  Prozac 20 Mg Caps (Fluoxetine hcl) .... Take one by mouth every other day as needed for fibromyalgia 4)  Elmiron 100 Mg Caps (Pentosan polysulfate sodium) .... Per urologist as needed 5)  Estrace 0.1 Mg/gm Crea (Estradiol) .Marland Kitchen.. 1 time per week vaginally 6)  Pyridium 100 Mg Tabs (Phenazopyridine hcl) .... Per urologist as needed 7)  Levothyroxine Sodium 75 Mcg Tabs  (Levothyroxine sodium) .Marland Kitchen.. 1 once daily for thyroid 8)  Prevacid 30 Mg Cpdr (Lansoprazole) .Marland Kitchen.. 1 each morning 30-36min before food or other fluids 9)  Hyoscyamine Sulfate 0.125 Mg Subl (Hyoscyamine sulfate) .... Take 2 sl as needed abd pain/cramping 10)  Pyridium 100 Mg Tabs (Phenazopyridine hcl) .... Take 1-2 every 8 hrs as needed urinary discomfort 11)  Ceftin 500 Mg Tabs (Cefuroxime axetil) .... Take 1 two times a day Prescriptions: PROZAC 20 MG  CAPS (FLUOXETINE HCL) Take one by mouth every other day as needed for Fibromyalgia  #30 x 6   Entered and Authorized by:   Gildardo Griffes FNP   Signed by:   Gildardo Griffes FNP on 05/20/2009   Method used:   Electronically to        [facility]* (retail)       [address]       [place], [place]  [address]       Ph: [phone]       Fax: [phone]   RxID:   [id number] CEFTIN 500 MG TABS (CEFUROXIME AXETIL) take 1 two times a day  #14 x 0   Entered and Authorized by:   Gildardo Griffes FNP   Signed by:   Gildardo Griffes FNP on 05/20/2009   Method used:   Electronically to        [facility]* (retail)       [address]       [place], [place]  [address]       Ph: [phone]       Fax: [phone]   RxID:   [id number]   Current Allergies (reviewed today): ! CIPRO (CIPROFLOXACIN HCL) ! FLAGYL (METRONIDAZOLE) ! SULFA ! LIPITOR (ATORVASTATIN CALCIUM) ! CELEBREX (CELECOXIB) ! ZOCOR (SIMVASTATIN) ! LYRICA (PREGABALIN) ! ZETIA (EZETIMIBE)

## 2010-06-16 NOTE — Miscellaneous (Signed)
  Medications Added SAVELLA 50 MG TABS (MILNACIPRAN HCL) Take 1 tablet by mouth two times a day       Clinical Lists Changes  Medications: Added new medication of SAVELLA 50 MG TABS (MILNACIPRAN HCL) Take 1 tablet by mouth two times a day

## 2010-06-16 NOTE — Assessment & Plan Note (Signed)
Summary: ?UTI/CLE   Vital Signs:  Patient profile:   70 year old female Height:      61 inches Weight:      149 pounds BMI:     28.26 Temp:     98.1 degrees F oral Pulse rate:   72 / minute Pulse rhythm:   regular BP sitting:   132 / 92  (left arm) Cuff size:   regular  Vitals Entered By: Linde Gillis CMA Duncan Dull) (Oct 02, 2009 10:42 AM) CC: ? UTI   History of Present Illness: 70 yo female here for ?UTI.  1.  ?UTI- has interstitial cystitis but cannot affort to see urologist.  Has been taking Almiron and pyridium which typically helps.  Over past week, symptoms of dysuria and increased frequency have worsened.  No fevers, nausea, vomting, hematuria.    2.  Fibromyalgia- we started her on Sevella at last office visit.  she feels it has helped tremendously with her pain but is causing profound headaches.  She has stopped taking it already.  She would like to restart Prozac.   Current Medications (verified): 1)  Prozac 20 Mg  Caps (Fluoxetine Hcl) .... Take One By Mouth Every Other Day As Needed For Fibromyalgia 2)  Estrace 0.1 Mg/gm Crea (Estradiol) .Marland Kitchen.. 1 Time Per Week Vaginally 3)  Pyridium 100 Mg Tabs (Phenazopyridine Hcl) .Marland Kitchen.. 1 Tab By Mouth Three Times A Day Prn 4)  Levothyroxine Sodium 75 Mcg Tabs (Levothyroxine Sodium) .Marland Kitchen.. 1 Once Daily For Thyroid 5)  Hyoscyamine Sulfate 0.125 Mg  Subl (Hyoscyamine Sulfate) .... Take 2 Sl As Needed Abd Pain/cramping 6)  Tylenol Pm Extra Strength 500-25 Mg Tabs (Diphenhydramine-Apap (Sleep)) .... Take One By Mouth At Bedtime 7)  Prevacid 30 Mg Cpdr (Lansoprazole) .... Take 1 Tab Twice Daily As Needed 8)  Multivitamins  Tabs (Multiple Vitamin) .... Once Daily 9)  Dexilant 60 Mg Cpdr (Dexlansoprazole) .... Take One By Mouth Once Daily 10)  Elmiron 100 Mg Caps (Pentosan Polysulfate Sodium) .Marland Kitchen.. 1 Tab By Mouth Three Times A Day Taken With Water One Hour Before or 2 Hours After Meals 11)  Vitamin D 1000 Unit  Tabs (Cholecalciferol) .... Take 1  Tablet By Mouth Once A Day  Allergies: 1)  ! Cipro (Ciprofloxacin Hcl) 2)  ! Flagyl (Metronidazole) 3)  ! Sulfa 4)  ! Lipitor (Atorvastatin Calcium) 5)  ! Celebrex (Celecoxib) 6)  ! Zocor (Simvastatin) 7)  ! Lyrica (Pregabalin) 8)  ! Zetia (Ezetimibe)  Review of Systems      See HPI General:  Denies chills and fever. GI:  Denies abdominal pain, nausea, and vomiting. GU:  Complains of dysuria and urinary frequency; denies hematuria and incontinence.  Physical Exam  General:  alert, well-developed, well-nourished, and well-hydrated.  NAD Mouth:  MMM Abdomen:  Bowel sounds positive,abdomen soft and minimally tender globally without masses, organomegaly or hernias noted. NO CVA tenderness Psych:  Cognition and judgment appear intact. Alert and cooperative with normal attention span and concentration. No apparent delusions, illusions, hallucinations   Impression & Recommendations:  Problem # 1:  DYSURIA, CHRONIC (ICD-788.1) Assessment Deteriorated UA neg for infection.  Will send for culture to verify absence of infection.  Most likely due to her intermittent recurrances of IC.  Continue Pyridium. Her updated medication list for this problem includes:    Pyridium 100 Mg Tabs (Phenazopyridine hcl) .Marland Kitchen... 1 tab by mouth three times a day prn  Problem # 2:  FIBROMYALGIA (ICD-729.1) Assessment: Unchanged Stopped the Sevela on her  own. Will restart Prozac.  She will follow up with me in 2 -3 weeks. Orders: Prescription Created Electronically [phone])  Complete Medication List: 1)  Prozac 20 Mg Caps (Fluoxetine hcl) .... Take one by mouth every other day as needed for fibromyalgia 2)  Estrace 0.1 Mg/gm Crea (Estradiol) .Marland Kitchen.. 1 time per week vaginally 3)  Pyridium 100 Mg Tabs (Phenazopyridine hcl) .Marland Kitchen.. 1 tab by mouth three times a day prn 4)  Levothyroxine Sodium 75 Mcg Tabs (Levothyroxine sodium) .Marland Kitchen.. 1 once daily for thyroid 5)  Hyoscyamine Sulfate 0.125 Mg Subl (Hyoscyamine  sulfate) .... Take 2 sl as needed abd pain/cramping 6)  Tylenol Pm Extra Strength 500-25 Mg Tabs (Diphenhydramine-apap (sleep)) .... Take one by mouth at bedtime 7)  Prevacid 30 Mg Cpdr (Lansoprazole) .... Take 1 tab twice daily as needed 8)  Multivitamins Tabs (Multiple vitamin) .... Once daily 9)  Dexilant 60 Mg Cpdr (Dexlansoprazole) .... Take one by mouth once daily 10)  Elmiron 100 Mg Caps (Pentosan polysulfate sodium) .Marland Kitchen.. 1 tab by mouth three times a day taken with water one hour before or 2 hours after meals 11)  Vitamin D 1000 Unit Tabs (Cholecalciferol) .... Take 1 tablet by mouth once a day  Other Orders: T-Culture, Urine ([id number]) Specimen Handling (91478) UA Dipstick w/o Micro (manual) ([address]1002) Prescriptions: PROZAC 20 MG  CAPS (FLUOXETINE HCL) Take one by mouth every other day as needed for Fibromyalgia  #30 x 3   Entered and Authorized by:   Ruthe Mannan MD   Signed by:   Ruthe Mannan MD on 10/02/2009   Method used:   Electronically to        [facility]* (retail)       [address]       [place], [place]  [address]       Ph: [phone]       Fax: [phone]   RxID:   [id number]   Current Allergies (reviewed today): ! CIPRO (CIPROFLOXACIN HCL) ! FLAGYL (METRONIDAZOLE) ! SULFA ! LIPITOR (ATORVASTATIN CALCIUM) ! CELEBREX (CELECOXIB) ! ZOCOR (SIMVASTATIN) ! LYRICA (PREGABALIN) ! ZETIA (EZETIMIBE)  Laboratory Results   Urine Tests  Date/Time Received: Oct 02, 2009 10:57 AM   Routine Urinalysis   Color: lt. yellow Appearance: Clear Glucose: negative   (Normal Range: Negative) Bilirubin: negative   (Normal Range: Negative) Ketone: negative   (Normal Range: Negative) Spec. Gravity: <1.005   (Normal Range: 1.003-1.035) Blood: negative   (Normal Range: Negative) pH: 6.0   (Normal Range: 5.0-8.0) Protein: negative   (Normal Range: Negative) Urobilinogen: 0.2   (Normal Range: 0-1) Nitrite: negative   (Normal Range: Negative) Leukocyte Esterace:  negative   (Normal Range: Negative)

## 2010-06-16 NOTE — Letter (Signed)
Summary: [facility]  [facility]   Imported By: Lanelle Bal 03/05/2010 12:24:22  _____________________________________________________________________  External Attachment:    Type:   Image     Comment:   External Document

## 2010-06-16 NOTE — Consult Note (Signed)
Summary: [facility]  [facility]   Imported By: Maryln Gottron 02/16/2010 10:37:12  _____________________________________________________________________  External Attachment:    Type:   Image     Comment:   External Document

## 2010-06-16 NOTE — Progress Notes (Signed)
Summary: Savella  Phone Note Refill Request Message from:  Fax from Pharmacy on Oct 14, 2009 12:55 PM  Refills Requested: Medication #1:  SAVELLA 50 MG TABS Take 1 tablet by mouth two times a day. This was not originally on the meds list.  Added for refill request.  [facility]   Method Requested: Electronic Initial call taken by: Delilah Shan CMA Duncan Dull),  Oct 14, 2009 12:55 PM  Follow-up for Phone Call        PLease call pt and see if she is taking it.  Last time I saw her, she had decided to stop taking it so we removed from her med list. Thanks, Jerre Simon with patient, she is still taking this medication.  She says that the Prozac was causing her to have headaches so she started [place] again.  Please send refill to [facility].  Linde Gillis CMA Duncan Dull)  Oct 14, 2009 3:10 PM     Prescriptions: SAVELLA 50 MG TABS (MILNACIPRAN HCL) Take 1 tablet by mouth two times a day  #60 x 3   Entered and Authorized by:   Ruthe Mannan MD   Signed by:   Ruthe Mannan MD on 10/15/2009   Method used:   Electronically to        [facility]* (retail)       [address]       [place], [place]  [address]       Ph: [phone]       Fax: [phone]   RxID:   [id number]

## 2010-06-16 NOTE — Assessment & Plan Note (Signed)
Summary: F/U UTI/CLE   Vital Signs:  Patient profile:   70 year old female Height:      61 inches Weight:      151.25 pounds BMI:     28.68 Temp:     98.5 degrees F oral Pulse rate:   68 / minute Pulse rhythm:   regular BP sitting:   130 / 90  (right arm) Cuff size:   regular  Vitals Entered By: Linde Gillis CMA Duncan Dull) (January 06, 2010 9:40 AM) CC: follow up UTI   History of Present Illness: 70 yo here for UTI follow up. Seen on 8/11, diagnosed with UTI. Given h/o recurrent UTI and interstitial cystitis with multiple abx allergies, sent for culture. Ecoli sensitive to Keflex, finished 10 day course. Doing better.  Less dysuria, no longer incontinent.  ?rheum referral- h/o fibromyalgia, failed multiple treatments along with arthritis.  Arthritis in her knees is worsening, she would like to see a rheumatologist.  Current Medications (verified): 1)  Prozac 20 Mg  Caps (Fluoxetine Hcl) .... Take One By Mouth Every Other Day As Needed For Fibromyalgia 2)  Estrace 0.1 Mg/gm Crea (Estradiol) .Marland Kitchen.. 1 Time Per Week Vaginally 3)  Pyridium 100 Mg Tabs (Phenazopyridine Hcl) .Marland Kitchen.. 1 Tab By Mouth Three Times A Day Prn 4)  Levothyroxine Sodium 75 Mcg Tabs (Levothyroxine Sodium) .Marland Kitchen.. 1 Once Daily For Thyroid 5)  Hyoscyamine Sulfate 0.125 Mg  Subl (Hyoscyamine Sulfate) .... Take 2 Sl As Needed Abd Pain/cramping 6)  Tylenol Pm Extra Strength 500-25 Mg Tabs (Diphenhydramine-Apap (Sleep)) .... Take One By Mouth At Bedtime 7)  Prevacid 30 Mg Cpdr (Lansoprazole) .... Take 1 Tab Twice Daily As Needed 8)  Multivitamins  Tabs (Multiple Vitamin) .... Once Daily 9)  Dexilant 60 Mg Cpdr (Dexlansoprazole) .... Take One By Mouth Once Daily 10)  Elmiron 100 Mg Caps (Pentosan Polysulfate Sodium) .Marland Kitchen.. 1 Tab By Mouth Three Times A Day Taken With Water One Hour Before or 2 Hours After Meals 11)  Vitamin D 1000 Unit  Tabs (Cholecalciferol) .... Take 1 Tablet By Mouth Once A Day 12)  Savella 50 Mg Tabs (Milnacipran  Hcl) .... Take 1 Tablet By Mouth Two Times A Day 13)  Keflex 500 Mg Caps (Cephalexin) .Marland Kitchen.. 1 Tab By Mouth Two Times A Day X 10 Days  Allergies: 1)  ! Cipro (Ciprofloxacin Hcl) 2)  ! Flagyl (Metronidazole) 3)  ! Sulfa 4)  ! Lipitor (Atorvastatin Calcium) 5)  ! Celebrex (Celecoxib) 6)  ! Zocor (Simvastatin) 7)  ! Lyrica (Pregabalin) 8)  ! Zetia (Ezetimibe)  Past History:  Past Medical History: Last updated: 06/24/2009 Diverticulosis, colon (05/17/1996) REMOTE COLON POLYPS GERD (11/14/1997) Hyperlipidemia (10/16/1998) Osteoporosis (03/17/2000) HYPOTHYROID FIBROMYALGIA  Past Surgical History: Last updated: 06/30/2009 appy                                         BTL /77 Colonoscopy Divertics Polyp (malignant) 1980 stapedectomy bilateral colonoscopy diverticuli neg polyps 05/1999 breast biopsy x 4  /94-98 dexa 11/01 EGD HH stricture dilated 03/28/03 bilateral cataract EGD 06/08/06 cysto 1/04 2D ECHO neg no MVP at rest 3/03 CT abd 11/98 D&C 06/2007 Colonoscopy Sm Polyp Mod Divertics (Dr Arlyce Dice) 12/26/08     5 yrs Esophagram/Barium Swallow/Barium Tab Study  NML  06/26/2009    [place]--rheumatiology [place]--urology [facility]--GI [place] Labs Lavone Neri --GI [place]--Rheumatologist Cope--urology [place]--GYN  Duda--ortho  Family History: Last updated: 06/24/2009 Father dec 86 Stroke  Mother A 73 Htn Multip Mini strokes Brother dec 56 MI (2009) Brother A 64 Sister A 67 Chol Htn No FH of Colon Cancer  Social History: Last updated: 06/25/2009 Marital Status: Married Children:  Occupation: retired Runner, broadcasting/film/video.  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  Alcohol Use - yes wine nightly  Risk Factors: Alcohol Use: 1 (07/02/2008) Caffeine Use: 2 (07/02/2008) Exercise: no (12/09/2008)  Risk Factors: Smoking Status: never (12/09/2008) Passive Smoke Exposure: no  (07/13/2007)  Review of Systems      See HPI General:  Denies fever. GI:  Denies abdominal pain. GU:  Complains of urinary frequency; denies dysuria and incontinence. MS:  Complains of joint pain; denies joint redness, joint swelling, and muscle weakness.  Physical Exam  General:  alert, well-developed, well-nourished, and well-hydrated.  NAD Abdomen:  Bowel sounds positive,abdomen soft and minimally tender globally without masses, organomegaly or hernias noted. NO CVA tenderness Msk:  No deformity or scoliosis noted of thoracic or lumbar spine. ROM nml at waist to flex/ ext, lat bend. Twist mildly painful, DTRs nml bilat, SLR nml. No effusions in knees. Neurologic:  No cranial nerve deficits noted. Station and gait are normal. Sensory, motor and coordinative functions appear intact. Psych:  Cognition and judgment appear intact. Alert and cooperative with normal attention span and concentration. No apparent delusions, illusions, hallucinations   Impression & Recommendations:  Problem # 1:  UTI (ICD-599.0) Assessment Improved UA neg, will send for culture for verification. Her updated medication list for this problem includes:    Pyridium 100 Mg Tabs (Phenazopyridine hcl) .Marland Kitchen... 1 tab by mouth three times a day prn    Keflex 500 Mg Caps (Cephalexin) .Marland Kitchen... 1 tab by mouth two times a day x 10 days  Orders: UA Dipstick w/o Micro (manual) ([address]) T-Culture, Urine ([id number])  Problem # 2:  ARTHRITIS (ICD-716.90) Assessment: Deteriorated will refer to rheumatology. Orders: Rheumatology Referral (Rheumatology)  Problem # 3:  FIBROMYALGIA (ICD-729.1) Assessment: Unchanged still poorly controlled.  rheum referrral is appropriate at this point. Orders: Rheumatology Referral (Rheumatology)  Complete Medication List: 1)  Prozac 20 Mg Caps (Fluoxetine hcl) .... Take one by mouth every other day as needed for fibromyalgia 2)  Estrace 0.1 Mg/gm Crea (Estradiol) .Marland Kitchen.. 1 time per week  vaginally 3)  Pyridium 100 Mg Tabs (Phenazopyridine hcl) .Marland Kitchen.. 1 tab by mouth three times a day prn 4)  Levothyroxine Sodium 75 Mcg Tabs (Levothyroxine sodium) .Marland Kitchen.. 1 once daily for thyroid 5)  Hyoscyamine Sulfate 0.125 Mg Subl (Hyoscyamine sulfate) .... Take 2 sl as needed abd pain/cramping 6)  Tylenol Pm Extra Strength 500-25 Mg Tabs (Diphenhydramine-apap (sleep)) .... Take one by mouth at bedtime 7)  Prevacid 30 Mg Cpdr (Lansoprazole) .... Take 1 tab twice daily as needed 8)  Multivitamins Tabs (Multiple vitamin) .... Once daily 9)  Dexilant 60 Mg Cpdr (Dexlansoprazole) .... Take one by mouth once daily 10)  Elmiron 100 Mg Caps (Pentosan polysulfate sodium) .Marland Kitchen.. 1 tab by mouth three times a day taken with water one hour before or 2 hours after meals 11)  Vitamin D 1000 Unit Tabs (Cholecalciferol) .... Take 1 tablet by mouth once a day 12)  Savella 50 Mg Tabs (Milnacipran hcl) .... Take 1 tablet by mouth two times a day 13)  Keflex 500 Mg Caps (Cephalexin) .Marland Kitchen.. 1 tab by mouth two times a day  x 10 days  Patient Instructions: 1)  Please stop by to see Shirlee Limerick on your way out to set up your rheumatology referral.  Current Allergies (reviewed today): ! CIPRO (CIPROFLOXACIN HCL) ! FLAGYL (METRONIDAZOLE) ! SULFA ! LIPITOR (ATORVASTATIN CALCIUM) ! CELEBREX (CELECOXIB) ! ZOCOR (SIMVASTATIN) ! LYRICA (PREGABALIN) ! ZETIA (EZETIMIBE)  Laboratory Results   Urine Tests  Date/Time Received: January 06, 2010 9:54 AM   Routine Urinalysis   Glucose: negative   (Normal Range: Negative) Bilirubin: negative   (Normal Range: Negative) Ketone: negative   (Normal Range: Negative) Spec. Gravity: 1.010   (Normal Range: 1.003-1.035) Blood: negative   (Normal Range: Negative) pH: 6.0   (Normal Range: 5.0-8.0) Protein: negative   (Normal Range: Negative) Urobilinogen: 0.2   (Normal Range: 0-1) Nitrite: negative   (Normal Range: Negative) Leukocyte Esterace: negative   (Normal Range: Negative)

## 2010-06-16 NOTE — Progress Notes (Signed)
Summary: Request MGM and U/S results  Phone Note Call from Patient Call back at Home Phone [phone]   Caller: Patient Call For: Ruthe Mannan MD Summary of Call: Patient called requesting the results of her mammogram and U/S.  Patient states that she was told by [facility] that the doctor should have the results by today. Called and spoke to [place] at [facility] [phone]) and was informed that she does not understand why someone told the patient that because it could take up to a week for the doctor to get the reports. I requested that they process these results ASAP because the patient is concerned. Initial call taken by: Sydell Axon LPN,  February 04, 2010 5:02 PM  Follow-up for Phone Call        Patient notified that Dr. Dayton Martes will let her know the results as soon as she gets them. Follow-up by: Sydell Axon LPN,  February 04, 2010 5:05 PM  Additional Follow-up for Phone Call Additional follow up Details #1::        thank you. Ruthe Mannan MD  February 05, 2010 7:45 AM

## 2010-06-16 NOTE — Progress Notes (Signed)
Summary: TRIAGE-Endo/Bravo   ---- Converted from flag ---- ---- 06/27/2009 9:09 AM, Louis Meckel MD wrote: needs 48hr bravo pH study while on ppi meds ------------------------------  Phone Note Outgoing Call   Call placed by: Laureen Ochs LPN,  June 27, 2009 4:51 PM Call placed to: Patient Summary of Call: Message left for patient to callback.   Initial call taken by: Laureen Ochs LPN,  June 27, 2009 4:51 PM  Follow-up for Phone Call        Message left for patient to callback. Laureen Ochs LPN  June 30, 2009 10:52 AM   Above MD orders reviewed with patient. Pt. states she is doing better, declines to schedule a Bravo at this time. She will keep appt. with Dr.Kplan on 07-16-09 at 9:45am to discuss it further with him. Pt. instructed to call back as needed.  Follow-up by: Laureen Ochs LPN,  June 30, 2009 4:28 PM  Additional Follow-up for Phone Call Additional follow up Details #1::        ok Additional Follow-up by: Louis Meckel MD,  July 01, 2009 8:33 AM

## 2010-06-16 NOTE — Assessment & Plan Note (Signed)
Summary: CHECK LUMP IN BREAST/CLE   Vital Signs:  Patient profile:   70 year old female Height:      61 inches Weight:      147.0 pounds BMI:     27.88 Temp:     98.5 degrees F oral Pulse rate:   68 / minute Pulse rhythm:   regular BP sitting:   120 / 70  (left arm) Cuff size:   regular  Vitals Entered By: Benny Lennert CMA Duncan Dull) (January 28, 2010 12:16 PM)  History of Present Illness: Chief complaint check lump in breast.  Has not had a mammogram in years because  she refused them. Past 3 weeks, has felt a lump in her right breast, soreness under her right arm. No changes in her nipple, nipple discharge. No nights sweats or fevers.   No family h/o breast CA.  Does have h/o colon CA.  Current Medications (verified): 1)  Levothyroxine Sodium 75 Mcg Tabs (Levothyroxine Sodium) .Marland Kitchen.. 1 Once Daily For Thyroid  Allergies: 1)  ! Cipro (Ciprofloxacin Hcl) 2)  ! Flagyl (Metronidazole) 3)  ! Sulfa 4)  ! Lipitor (Atorvastatin Calcium) 5)  ! Celebrex (Celecoxib) 6)  ! Zocor (Simvastatin) 7)  ! Lyrica (Pregabalin) 8)  ! Zetia (Ezetimibe)  Past History:  Past Medical History: Last updated: 06/24/2009 Diverticulosis, colon (05/17/1996) REMOTE COLON POLYPS GERD (11/14/1997) Hyperlipidemia (10/16/1998) Osteoporosis (03/17/2000) HYPOTHYROID FIBROMYALGIA  Past Surgical History: Last updated: 06/30/2009 appy                                         BTL /77 Colonoscopy Divertics Polyp (malignant) 1980 stapedectomy bilateral colonoscopy diverticuli neg polyps 05/1999 breast biopsy x 4  /94-98 dexa 11/01 EGD HH stricture dilated 03/28/03 bilateral cataract EGD 06/08/06 cysto 1/04 2D ECHO neg no MVP at rest 3/03 CT abd 11/98 D&C 06/2007 Colonoscopy Sm Polyp Mod Divertics (Dr Arlyce Dice) 12/26/08     5 yrs Esophagram/Barium Swallow/Barium Tab Study  NML  06/26/2009    [facility]--rheumatiology [facility]--urology [facility]--GI [facility] Lavone Neri  --GI [facility]--Rheumatologist [facility]--urology [facility]--GYN Duda--ortho  Family History: Last updated: 06/24/2009 Father dec 86 Stroke  Mother A 86 Htn Multip Mini strokes Brother dec 56 MI (2009) Brother A 43 Sister A 67 Chol Htn No FH of Colon Cancer  Social History: Last updated: 06/25/2009 Marital Status: Married Children:  Occupation: retired Runner, broadcasting/film/video.  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  Alcohol Use - yes wine nightly  Risk Factors: Alcohol Use: 1 (07/02/2008) Caffeine Use: 2 (07/02/2008) Exercise: no (12/09/2008)  Risk Factors: Smoking Status: never (12/09/2008) Passive Smoke Exposure: no (07/13/2007)  Review of Systems      See HPI General:  Denies chills, fever, and sweats. GI:  Denies nausea and vomiting.  Physical Exam  General:  alert, well-developed, well-nourished, and well-hydrated.  NAD Breasts:  palpable breast lump, 9 oclock right breast.  skin/areolae normal, no abnormal thickening, no nipple discharge, and no tenderness.   Axillary Nodes:  R axillary LN enlarged and tender.   Psych:  Cognition and judgment appear intact. Alert and cooperative with normal attention span and concentration. No apparent delusions, illusions, hallucinations   Impression & Recommendations:  Problem # 1:  BREAST MASS, RIGHT (ICD-611.72) Assessment New Will refer for immediate diagnostic mammogram- breast mass vs  infectious process. Pt in agreement with plan. Orders: Radiology Referral (Radiology)  Complete Medication List: 1)  Levothyroxine Sodium 75 Mcg Tabs (Levothyroxine sodium) .Marland Kitchen.. 1 once daily for thyroid  Patient Instructions: 1)  Please stop by to see Aram Beecham on your way out to set up your mammogram.  Current Allergies (reviewed today): ! CIPRO (CIPROFLOXACIN HCL) ! FLAGYL (METRONIDAZOLE) ! SULFA ! LIPITOR (ATORVASTATIN CALCIUM) ! CELEBREX (CELECOXIB) ! ZOCOR (SIMVASTATIN) !  LYRICA (PREGABALIN) ! ZETIA (EZETIMIBE)

## 2010-06-16 NOTE — Progress Notes (Signed)
Summary: TRIAGE   Phone Note Call from Patient Call back at Home Phone [phone] Call back at [phone] cell   Caller: Patient Call For: Amanda Kane Reason for Call: Talk to Nurse Summary of Call: Patient has a lot of pain and difficulty swallowing, states that everything she eats comes back up. Wants to be seen sooner than first available 3-2 Initial call taken by: Tawni Levy,  June 23, 2009 11:26 AM  Follow-up for Phone Call        Pt. had an Endo/Dil. in 11/2008.  Pt. c/o daily indigestion, heartburn,  chest pressure and dysphagia. "Feels just like I am always choking" Takes Prevacid daily.  1) See Amy Esterwood PAC on 06-24-09 at 9am 2) Soft,bland diet. Be very careful with meats,rice and breads. 3) Pt. instructed to call back as needed.  Follow-up by: Laureen Ochs LPN,  June 23, 2009 1:16 PM

## 2010-06-16 NOTE — Progress Notes (Signed)
Summary: elmiron  Phone Note Call from Patient Call back at Home Phone [phone]   Caller: Patient Call For: Amanda Mannan MD Summary of Call: Patient was prescribed elmiron by Dr. Salley Scarlet. She wants to know if you could fill it for her. Elmiron 100 mg cps 1 by mouth 3 times a day. Uses [facility]. Initial call taken by: Melody Comas,  Oct 10, 2009 2:40 PM    Prescriptions: ELMIRON 100 MG CAPS (PENTOSAN POLYSULFATE SODIUM) 1 tab by mouth three times a day taken with water one hour before or 2 hours after meals  #90 x 1   Entered and Authorized by:   Amanda Mannan MD   Signed by:   Amanda Mannan MD on 10/10/2009   Method used:   Electronically to        [facility]* (retail)       [address]       [place], [place]  [address]       Ph: [phone]       Fax: [phone]   RxID:   [id number]

## 2010-06-16 NOTE — Letter (Signed)
Summary: [facility] No Show Letter   at [facility]  [address] [place], [place] [address]   Phone: [phone]  Fax: [phone]    11/13/2009 MRN: [id number]  Amanda Kane [address] [place], [place]  [address]   Dear Ms. Cail,   Our records indicate that you missed your scheduled appointment with __lab___________________ on _6.30.11___________.  Please contact this office to reschedule your appointment as soon as possible.  It is important that you keep your scheduled appointments with your physician, so we can provide you the best care possible.  Please be advised that there may be a charge for "no show" appointments.    Sincerely,    at [facility]

## 2010-06-16 NOTE — Assessment & Plan Note (Signed)
Summary: consult for chronic cough   Copy to:  Melvia Heaps Primary Provider/Referring Provider:  Laurita Quint, MD  CC:  Pulmonary Consult.  History of Present Illness: The pt is a 70y/o female who I have been asked to see for chronic cough.  The pt has had longstanding GI issues with reflux, and associated cough.  In Dec, she began to notice a tightness in her chest and throat, as well as increased cough with minimal mucus production that was mildly discolored.  She was treated for presumed acute bronchitis with an abx, and did have some improvement in her symptoms.  However, her cough persisted.  She describes severe hoarseness with very frequent throat clearing.  She has a classic globus sensation that she describes as a fullness in her throat.  Her cough is currently dry, hacky in nature, and worsened with conversation.  She describes some postnasal drip and ear fullness.  The pt has a h/o GERD with esoph. stricture and s/p dilatation in the past.  She currently is having signficant reflux symptoms along with regurgitation.  Her PPI has been increased by GI, and further w/u is planned.  The pt has never been formally diagnosed with any pulmonary disease, and she has had no recent cxr.    Current Medications (verified): 1)  Nicomide 750-25-1.5-0.5 Mg  Tabs (Niacinamide-Zinc-Copper-Fa) .... Take One By Mouth Once A Day As Needed 2)  Rosac 10-5 %  Crea (Sulfacetamide-Sulfur-Sunscreen) .... Use As Needed 3)  Prozac 20 Mg  Caps (Fluoxetine Hcl) .... Take One By Mouth Every Other Day As Needed For Fibromyalgia 4)  Elmiron 100 Mg Caps (Pentosan Polysulfate Sodium) .... Per Urologist As Needed 5)  Estrace 0.1 Mg/gm Crea (Estradiol) .Marland Kitchen.. 1 Time Per Week Vaginally 6)  Pyridium 100 Mg Tabs (Phenazopyridine Hcl) .... Per Urologist As Needed 7)  Levothyroxine Sodium 75 Mcg Tabs (Levothyroxine Sodium) .Marland Kitchen.. 1 Once Daily For Thyroid 8)  Hyoscyamine Sulfate 0.125 Mg  Subl (Hyoscyamine Sulfate) .... Take 2  Sl As Needed Abd Pain/cramping 9)  Tylenol Pm Extra Strength 500-25 Mg Tabs (Diphenhydramine-Apap (Sleep)) .... Take One By Mouth At Bedtime 10)  Prevacid 30 Mg Cpdr (Lansoprazole) .... Take 1 Tab Twice Daily  Allergies (verified): 1)  ! Cipro (Ciprofloxacin Hcl) 2)  ! Flagyl (Metronidazole) 3)  ! Sulfa 4)  ! Lipitor (Atorvastatin Calcium) 5)  ! Celebrex (Celecoxib) 6)  ! Zocor (Simvastatin) 7)  ! Lyrica (Pregabalin) 8)  ! Zetia (Ezetimibe)  Past History:  Past Medical History: Reviewed history from 06/24/2009 and no changes required. Diverticulosis, colon (05/17/1996) REMOTE COLON POLYPS GERD (11/14/1997) Hyperlipidemia (10/16/1998) Osteoporosis (03/17/2000) HYPOTHYROID FIBROMYALGIA  Past Surgical History: Reviewed history from 06/24/2009 and no changes required. appy                                         BTL /77 Colonoscopy Divertics Polyp (malignant) 1980 stapedectomy bilateral colonoscopy diverticuli neg polyps 05/1999 breast biopsy x 4  /94-98 dexa 11/01 EGD HH stricture dilated 03/28/03 bilateral cataract EGD 06/08/06 cysto 1/04 2D ECHO neg no MVP at rest 3/03 CT abd 11/98 D&C 06/2007 Colonoscopy Sm Polyp Mod Divertics (Dr Arlyce Dice) 12/26/08     5 yrs    [facility]--rheumatiology [facility]--urology [facility]--GI [facility]--GYN         Arlyce Dice --GI [facility]--Rheumatologist [facility]--urology [facility]--GYN Duda--ortho  Family History: Reviewed history from 06/24/2009 and no changes required. Father dec 38  Stroke  Mother A 41 Htn Multip Mini strokes Brother dec 56 MI (2009) Brother A 7 Sister A 67 Chol Htn No FH of Colon Cancer  Social History: Reviewed history from 06/24/2009 and no changes required. Marital Status: Married Children:  Occupation: retired Runner, broadcasting/film/video.  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  Alcohol Use - yes wine nightly  Review of Systems       The patient  complains of shortness of breath with activity, productive cough, non-productive cough, chest pain, acid heartburn, indigestion, difficulty swallowing, sore throat, headaches, itching, ear ache, and joint stiffness or pain.  The patient denies shortness of breath at rest, coughing up blood, irregular heartbeats, loss of appetite, weight change, abdominal pain, tooth/dental problems, nasal congestion/difficulty breathing through nose, sneezing, anxiety, depression, hand/feet swelling, rash, change in color of mucus, and fever.    Vital Signs:  Patient profile:   70 year old female Height:      61 inches Weight:      157.38 pounds BMI:     29.84 O2 Sat:      99 % on Room air Temp:     98.0 degrees F oral Pulse rate:   90 / minute BP sitting:   124 / 80  (left arm)  Vitals Entered By: Arman Filter LPN (June 25, 2009 9:02 AM)  O2 Flow:  Room air CC: Pulmonary Consult Comments Medications reviewed with patient  Arman Filter LPN  June 25, 2009 9:02 AM    Physical Exam  General:  ow female in nad Eyes:  PERRLA and EOMI.   Nose:  patent without discharge Mouth:  clear Neck:  no jvd, tmg, LN Lungs:  totally clear to auscultation Heart:  rrr, no mrg Abdomen:  soft and nontender, bs+ Extremities:  no edema noted, no cyanosis pulses intact distally Neurologic:  alert and oriented, moves all 4.   Impression & Recommendations:  Problem # 1:  COUGH (ICD-786.2) the pt's cough is clearly upper airway in origin, and is being driven by reflux disease/regurgitation and a cyclical cough mechanism.  There is nothing to suggest a lower airway etiology for this, but will check cxr for completeness.  Will treat with cyclical cough protocol to help decrease severity of her cough, but it will not resolve until the aggrevating factors are totally controlled.  Her GI issues will need to be addressed aggressively while we are working on decreasing upper airway hypersensitivity.    Medications  Added to Medication List This Visit: 1)  Tessalon Perles 100 Mg Caps (Benzonatate) .... Two by mouth every 6 hrs if needed. 2)  Tussicaps 10-8 Mg Xr12h-cap (Hydrocod polst-chlorphen polst) .... One every 12hours if needed.  Other Orders: Consultation Level IV (99244) T-2 View CXR (71020TC)  Patient Instructions: 1)  will check cxr today. 2)  can take chlorpheniramine 8mg  at bedtime for postnasal drip, but stop once you start the cyclical cough protocol. 3)  will start on the cyclical cough protocol once you have 3 days to commit to the program. 4)  work with GI on your reflux...your cough will not go away until this is taken care of. 5)  followup with me after GI evaluation if cough continues.   Prescriptions: TUSSICAPS 10-8 MG XR12H-CAP (HYDROCOD POLST-CHLORPHEN POLST) one every 12hours if needed.  #10 x 0   Entered and Authorized by:   Barbaraann Share MD   Signed by:  Barbaraann Share MD on 06/25/2009   Method used:   Print then Give to Patient   RxID:   [id number] TESSALON PERLES 100 MG  CAPS (BENZONATATE) two by mouth every 6 hrs if needed.  #30 x 1   Entered and Authorized by:   Barbaraann Share MD   Signed by:   Barbaraann Share MD on 06/25/2009   Method used:   Print then Give to Patient   RxID:   [id number]

## 2010-06-16 NOTE — Progress Notes (Signed)
Summary: refill request for lansoprazole  Phone Note Refill Request Message from:  Fax from Pharmacy  Refills Requested: Medication #1:  PREVACID 30 MG CPDR Take 1 tab twice daily as needed   Last Refilled: 12/28/2008 Faxed request from [facility] mail order pharmacy.  Request is for one a day, chart says pt takes one twice a day.  Form is on your desk.  Initial call taken by: Lowella Petties CMA,  January 09, 2010 2:43 PM  Follow-up for Phone Call        On my desk. Ruthe Mannan MD  January 12, 2010 7:48 AM   Additional Follow-up for Phone Call Additional follow up Details #1::        Faxed form.                Lowella Petties CMA  January 12, 2010 11:53 AM

## 2010-06-16 NOTE — Assessment & Plan Note (Signed)
Summary: 30 MIN REFILL MED/BILLIE'S PT/CLE   Vital Signs:  Patient profile:   70 year old female Height:      61 inches Weight:      154.25 pounds BMI:     29.25 Temp:     98.5 degrees F oral Pulse rate:   84 / minute Pulse rhythm:   regular BP sitting:   112 / 72  (left arm) Cuff size:   regular  Vitals Entered By: Delilah Shan CMA Duncan Dull) (August 12, 2009 9:12 AM) CC: 30 minute (BDB)   Refill med.   History of Present Illness: 70 yo female new to me here for med refill and to discuss several issues.  1.  Fibromyalgia- neuropathic pain in arms, legs getting worse.  Currently on Prozaac, does not help much.  Tried Lyrica in past, made her too sleepy.    2.  Hypothyroidism- currently taking Levothyroxine 75 micrograms daily.  Has not had TSH checked in a while.  Wants to make sure her thyroid isn't the cause of her lethargy and pain.  3.  HLD- lipids last check in 06/2008- TG 229, LDL 136.  All statins make her fibromyalgia worse.  Has never tried Niaspan.  4.  Interstitial cystitis- cannot afford to see her urologist anymore.  has been well controlled on almiron and pyridium.  Would like for me to refill those.  Current Medications (verified): 1)  Prozac 20 Mg  Caps (Fluoxetine Hcl) .... Take One By Mouth Every Other Day As Needed For Fibromyalgia 2)  Estrace 0.1 Mg/gm Crea (Estradiol) .Marland Kitchen.. 1 Time Per Week Vaginally 3)  Pyridium 100 Mg Tabs (Phenazopyridine Hcl) .Marland Kitchen.. 1 Tab By Mouth Three Times A Day Prn 4)  Levothyroxine Sodium 75 Mcg Tabs (Levothyroxine Sodium) .Marland Kitchen.. 1 Once Daily For Thyroid 5)  Hyoscyamine Sulfate 0.125 Mg  Subl (Hyoscyamine Sulfate) .... Take 2 Sl As Needed Abd Pain/cramping 6)  Tylenol Pm Extra Strength 500-25 Mg Tabs (Diphenhydramine-Apap (Sleep)) .... Take One By Mouth At Bedtime 7)  Prevacid 30 Mg Cpdr (Lansoprazole) .... Take 1 Tab Twice Daily As Needed 8)  Tessalon Perles 100 Mg  Caps (Benzonatate) .... Two By Mouth Every 6 Hrs If Needed. 9)  Tussicaps  10-8 Mg Xr12h-Cap (Hydrocod Polst-Chlorphen Polst) .... One Every 12hours If Needed. 10)  Multivitamins  Tabs (Multiple Vitamin) .... Once Daily 11)  Dexilant 60 Mg Cpdr (Dexlansoprazole) .... Take One By Mouth Once Daily 12)  Savella Titration Pack 12.5 & 25 & 50 Mg Misc (Milnacipran Hcl) .Marland Kitchen.. 12.5 Mg By Mouth Daily X 1 Day, 12.5 Mg Two Times A Day X 2 Days, 25 Mg Two Times A Day X4 Days, Then 50 Mg Two Times A Day. 13)  Elmiron 100 Mg Caps (Pentosan Polysulfate Sodium) .Marland Kitchen.. 1 Tab By Mouth Three Times A Day Taken With Water One Hour Before or 2 Hours After Meals  Allergies (verified): 1)  ! Cipro (Ciprofloxacin Hcl) 2)  ! Flagyl (Metronidazole) 3)  ! Sulfa 4)  ! Lipitor (Atorvastatin Calcium) 5)  ! Celebrex (Celecoxib) 6)  ! Zocor (Simvastatin) 7)  ! Lyrica (Pregabalin) 8)  ! Zetia (Ezetimibe)  Review of Systems      See HPI General:  Complains of malaise; denies chills and fever. Eyes:  Denies blurring. ENT:  Denies difficulty swallowing. CV:  Denies shortness of breath with exertion. GI:  Denies abdominal pain. MS:  Complains of muscle aches; denies joint pain, joint redness, and joint swelling. Psych:  Denies anxiety and depression.  Physical Exam  General:  alert, well-developed, well-nourished, and well-hydrated.  NAD Mouth:  MMM Neck:  No deformities, masses, or tenderness noted. Psych:  Cognition and judgment appear intact. Alert and cooperative with normal attention span and concentration. No apparent delusions, illusions, hallucinations   Impression & Recommendations:  Problem # 1:  FIBROMYALGIA (ICD-729.1) Assessment Deteriorated  Time spent with patient 25 minutes, more than 50% of this time was spent counseling patient on treatment options for fibromyalgia.  Given handout about supplements and decided to try [facility].  Stop taking prozac. Follow up with me in one month.  Will also check Vit D levels.  Orders: Venipuncture [id number]) T-Vitamin D (25-Hydroxy)  [phone]) Prescription Created Electronically [phone])  Problem # 2:  HYPERLIPIDEMIA (ICD-272.4) Assessment: Unchanged  rechecked FLP today.  If remains elevated, I would like to start Niaspan.  Pt in agreement with plan.  Orders: Venipuncture ([id number])  Problem # 3:  HYPOTHYROIDISM NOS (ICD-244.9) Assessment: Unchanged  recheck TSH today. Her updated medication list for this problem includes:    Levothyroxine Sodium 75 Mcg Tabs (Levothyroxine sodium) .Marland Kitchen... 1 once daily for thyroid  Orders: Venipuncture (10626) TLB-TSH (Thyroid Stimulating Hormone) (84443-TSH)  Complete Medication List: 1)  Prozac 20 Mg Caps (Fluoxetine hcl) .... Take one by mouth every other day as needed for fibromyalgia 2)  Estrace 0.1 Mg/gm Crea (Estradiol) .Marland Kitchen.. 1 time per week vaginally 3)  Pyridium 100 Mg Tabs (Phenazopyridine hcl) .Marland Kitchen.. 1 tab by mouth three times a day prn 4)  Levothyroxine Sodium 75 Mcg Tabs (Levothyroxine sodium) .Marland Kitchen.. 1 once daily for thyroid 5)  Hyoscyamine Sulfate 0.125 Mg Subl (Hyoscyamine sulfate) .... Take 2 sl as needed abd pain/cramping 6)  Tylenol Pm Extra Strength 500-25 Mg Tabs (Diphenhydramine-apap (sleep)) .... Take one by mouth at bedtime 7)  Prevacid 30 Mg Cpdr (Lansoprazole) .... Take 1 tab twice daily as needed 8)  Tessalon Perles 100 Mg Caps (Benzonatate) .... Two by mouth every 6 hrs if needed. 9)  Tussicaps 10-8 Mg Xr12h-cap (Hydrocod polst-chlorphen polst) .... One every 12hours if needed. 10)  Multivitamins Tabs (Multiple vitamin) .... Once daily 11)  Dexilant 60 Mg Cpdr (Dexlansoprazole) .... Take one by mouth once daily 12)  Savella Titration Pack 12.5 & 25 & 50 Mg Misc (Milnacipran hcl) .Marland Kitchen.. 12.5 mg by mouth daily x 1 day, 12.5 mg two times a day x 2 days, 25 mg two times a day x4 days, then 50 mg two times a day. 13)  Elmiron 100 Mg Caps (Pentosan polysulfate sodium) .Marland Kitchen.. 1 tab by mouth three times a day taken with water one hour before or 2 hours after  meals  Other Orders: TLB-BMP (Basic Metabolic Panel-BMET) (80048-METABOL) TLB-Hepatic/Liver Function Pnl (80076-HEPATIC)  Patient Instructions: 1)  NIce to meet you, Ms. Wacker. 2)  Please stop taking Prozaac. 3)  Come see me in one month. Prescriptions: LEVOTHYROXINE SODIUM 75 MCG TABS (LEVOTHYROXINE SODIUM) 1 once daily for thyroid  #30 x 3   Entered and Authorized by:   Ruthe Mannan MD   Signed by:   Ruthe Mannan MD on 08/12/2009   Method used:   Electronically to        [facility]* (retail)       [address]       [place], [place]  [address]       Ph: [phone]       Fax: [phone]   RxID:   [id number] PYRIDIUM 100 MG TABS (PHENAZOPYRIDINE HCL) 1 tab by mouth three times  a day prn  #90 x 0   Entered and Authorized by:   Ruthe Mannan MD   Signed by:   Ruthe Mannan MD on 08/12/2009   Method used:   Electronically to        [facility]* (retail)       [address]       [place], [place]  [address]       Ph: [phone]       Fax: [phone]   RxID:   [id number] ELMIRON 100 MG CAPS (PENTOSAN POLYSULFATE SODIUM) 1 tab by mouth three times a day taken with water one hour before or 2 hours after meals  #90 x 1   Entered and Authorized by:   Ruthe Mannan MD   Signed by:   Ruthe Mannan MD on 08/12/2009   Method used:   Electronically to        [facility]* (retail)       [address]       [place], [place]  [address]       Ph: [phone]       Fax: [phone]   RxID:   [id number] SAVELLA TITRATION PACK 12.5 & 25 & 50 MG MISC (MILNACIPRAN HCL) 12.5 mg by mouth daily x 1 day, 12.5 mg two times a day x 2 days, 25 mg two times a day x4 days, then 50 mg two times a day.  #1 x 0   Entered and Authorized by:   Ruthe Mannan MD   Signed by:   Ruthe Mannan MD on 08/12/2009   Method used:   Electronically to        [facility]* (retail)       [address]       [place], [place]  [address]       Ph: [phone]       Fax: [phone]   RxID:    [id number]   Prior Medications: PROZAC 20 MG  CAPS (FLUOXETINE HCL) Take one by mouth every other day as needed for Fibromyalgia ESTRACE 0.1 MG/GM CREA (ESTRADIOL) 1 TIME PER WEEK VAGINALLY LEVOTHYROXINE SODIUM 75 MCG TABS (LEVOTHYROXINE SODIUM) 1 once daily for thyroid HYOSCYAMINE SULFATE 0.125 MG  SUBL (HYOSCYAMINE SULFATE) Take 2 SL as needed abd pain/cramping TYLENOL PM EXTRA STRENGTH 500-25 MG TABS (DIPHENHYDRAMINE-APAP (SLEEP)) take one by mouth at bedtime PREVACID 30 MG CPDR (LANSOPRAZOLE) Take 1 tab twice daily as needed TESSALON PERLES 100 MG  CAPS (BENZONATATE) two by mouth every 6 hrs if needed. TUSSICAPS 10-8 MG XR12H-CAP (HYDROCOD POLST-CHLORPHEN POLST) one every 12hours if needed. MULTIVITAMINS  TABS (MULTIPLE VITAMIN) once daily DEXILANT 60 MG CPDR (DEXLANSOPRAZOLE) take one by mouth once daily Current Allergies (reviewed today): ! CIPRO (CIPROFLOXACIN HCL) ! FLAGYL (METRONIDAZOLE) ! SULFA ! LIPITOR (ATORVASTATIN CALCIUM) ! CELEBREX (CELECOXIB) ! ZOCOR (SIMVASTATIN) ! LYRICA (PREGABALIN) ! ZETIA (EZETIMIBE)  Appended Document: 30 MIN REFILL MED/BILLIE'S PT/CLE

## 2010-06-16 NOTE — Miscellaneous (Signed)
  Medications Added VITAMIN D 1000 UNIT  TABS (CHOLECALCIFEROL) Take 1 tablet by mouth once a day       Clinical Lists Changes  Medications: Added new medication of VITAMIN D 1000 UNIT  TABS (CHOLECALCIFEROL) Take 1 tablet by mouth once a day

## 2010-06-16 NOTE — Progress Notes (Signed)
Summary: prescript  Phone Note From Pharmacy Call back at [phone]   Caller: Patient [facility]er[facility] Call For: clance  Summary of Call: need clarification on quanity for tussonex Initial call taken by: Rickard Patience,  June 25, 2009 3:13 PM  Follow-up for Phone Call        called [facility], spoke with Brittnay.  Per [place], she called in error.  states after calling, she realized the rx was not for tussinex and that it was for tussicaps.   Follow-up by: Gweneth Dimitri RN,  June 25, 2009 4:28 PM

## 2010-06-18 NOTE — Progress Notes (Signed)
Summary: refill request for pyridium  Phone Note Refill Request Message from:  Fax from Pharmacy  Refills Requested: Medication #1:  phenazopyridine 100 mg   Last Refilled: 05/30/2009 F[facility]ed request [facility] [address], this is no longer on med list.  Initial call taken by: Lowella Petties CMA, AAMA,  May 26, 2010 12:27 PM    New/Updated Medications: PYRIDIUM 100 MG TABS (PHENAZOPYRIDINE HCL) 1 tab by mouth three times a day x 2 days Prescriptions: PYRIDIUM 100 MG TABS (PHENAZOPYRIDINE HCL) 1 tab by mouth three times a day x 2 days  #6 x 0   Entered and Authorized by:   Ruthe Mannan MD   Signed by:   Ruthe Mannan MD on 05/26/2010   Method used:   [facility] [address] [facility]ail[facility]       [address]       [place], [place]  [address]       Ph: [phone] or [phone]       Fax: [phone]   RxID:   [id number] PYRIDIUM 100 MG TABS (PHENAZOPYRIDINE HCL) 1 tab by mouth three times a day x 2 days  #6 x 0   Entered and Authorized by:   Ruthe Mannan MD   Signed by:   Ruthe Mannan MD on 05/26/2010   Method used:   Electronically to        [facility] (retail)       [address]       [place], [place]  [address]       Ph: [phone]       Fax: [phone]   RxID:   [id number]

## 2010-06-18 NOTE — Assessment & Plan Note (Signed)
Summary: uti/alc   Vital Signs:  Patient profile:   70 year old female Height:      61 inches Weight:      152.50 pounds BMI:     28.92 Temp:     98.4 degrees F oral Pulse rate:   60 / minute Pulse rhythm:   regular BP sitting:   140 / 90  (right arm) Cuff size:   regular  Vitals Entered By: Linde Gillis CMA Duncan Dull) (June 09, 2010 11:42 AM) CC: ? UTI   History of Present Illness: 70 yo here for? UTI.  Has h/o recurrent UTI and interstitial cystitis with multiple abx allergies.  Past two months, feel more suprapubic pressure, "spasms." On Pyridium. Followed by Dr. Marice Potter as well.    Wanted to make sure not a UTI this time.  Allergies: 1)  ! Cipro (Ciprofloxacin Hcl) 2)  ! Flagyl (Metronidazole) 3)  ! Sulfa 4)  ! Lipitor (Atorvastatin Calcium) 5)  ! Celebrex (Celecoxib) 6)  ! Zocor (Simvastatin) 7)  ! Lyrica (Pregabalin) 8)  ! Zetia (Ezetimibe)  Review of Systems      See HPI General:  Denies chills and fever. GI:  Denies abdominal pain, nausea, and vomiting. GU:  Denies dysuria and hematuria.  Physical Exam  General:  alert, well-developed, well-nourished, and well-hydrated.  NAD Abdomen:  Bowel sounds positive,abdomen soft and minimally tender globally without masses, organomegaly or hernias noted. NO CVA tenderness Psych:  Cognition and judgment appear intact. Alert and cooperative with normal attention span and concentration. No apparent delusions, illusions, hallucinations   Impression & Recommendations:  Problem # 1:  DYSURIA, CHRONIC (ICD-788.1) Assessment Deteriorated Reassurance provided, UA neg. Likely due to chronic IC. Send for culture to verify no bacteria. Her updated medication list for this problem includes:    Pyridium 100 Mg Tabs (Phenazopyridine hcl) .Marland Kitchen... 1 tab by mouth three times a day x 2 days  Orders: UA Dipstick w/o Micro (manual) (16109) Specimen Handling (99000) T-Urine Culture (Spectrum Order) [id number])  Complete  Medication List: 1)  Levothyroxine Sodium 75 Mcg Tabs (Levothyroxine sodium) .Marland Kitchen.. 1 once daily for thyroid 2)  Pyridium 100 Mg Tabs (Phenazopyridine hcl) .Marland Kitchen.. 1 tab by mouth three times a day x 2 days   Orders Added: 1)  UA Dipstick w/o Micro (manual) [81002] 2)  Specimen Handling [99000] 3)  T-Urine Culture (Spectrum Order) [[id number]] 4)  Est. Patient Level III [13086]    Current Allergies (reviewed today): ! CIPRO (CIPROFLOXACIN HCL) ! FLAGYL (METRONIDAZOLE) ! SULFA ! LIPITOR (ATORVASTATIN CALCIUM) ! CELEBREX (CELECOXIB) ! ZOCOR (SIMVASTATIN) ! LYRICA (PREGABALIN) ! ZETIA (EZETIMIBE)  Laboratory Results   Urine Tests  Date/Time Received: June 09, 2010 11:51 AM   Routine Urinalysis   Color: yellow Appearance: Clear Glucose: negative   (Normal Range: Negative) Bilirubin: negative   (Normal Range: Negative) Ketone: negative   (Normal Range: Negative) Spec. Gravity: 1.010   (Normal Range: 1.003-1.035) Blood: negative   (Normal Range: Negative) pH: 5.0   (Normal Range: 5.0-8.0) Protein: negative   (Normal Range: Negative) Urobilinogen: 0.2   (Normal Range: 0-1) Nitrite: negative   (Normal Range: Negative) Leukocyte Esterace: negative   (Normal Range: Negative)

## 2010-06-29 ENCOUNTER — Telehealth: Payer: Self-pay | Admitting: Family Medicine

## 2010-07-08 NOTE — Progress Notes (Signed)
Summary: pt wants scripts for raosacea  Phone Note Call from Patient   Caller: Patient Call For: Ruthe Mannan MD Summary of Call: Pt is asking if she can get scripts for nicomide and rasac sent to [facility].  She has not had these in awhile but she says her face is getting very red with the winter time.               Lowella Petties CMA, AAMA  June 29, 2010 2:56 PM   Follow-up for Phone Call        Please ask pt to either call her dermatologist for refill or have pharmacy call us with exact dosage.  I typically do not prescribe these medications for rosacea- I usually use azelaic acid. Ruthe Mannan MD  June 29, 2010 3:03 PM  Pt states Everrett Coombe was the one that previously prescribed these meds.  She says if you want to prescribe something else that will be fine. Follow-up by: Lowella Petties CMA, AAMA,  June 29, 2010 3:27 PM    New/Updated Medications: NICOMIDE 750-25-1.5-0.5 MG TABS (NIACINAMIDE-ZN-CU-METHYLFOLATE) 1 tab by mouth daily. Prescriptions: NICOMIDE 750-25-1.5-0.5 MG TABS (NIACINAMIDE-ZN-CU-METHYLFOLATE) 1 tab by mouth daily.  #30 x 0   Entered and Authorized by:   Ruthe Mannan MD   Signed by:   Ruthe Mannan MD on 06/29/2010   Method used:   Electronically to        [facility]* (retail)       [address]       [place], [place]  [address]       Ph: [phone]       Fax: [phone]   RxID:   [id number]

## 2010-08-12 ENCOUNTER — Ambulatory Visit: Payer: Self-pay | Admitting: Family Medicine

## 2010-08-18 ENCOUNTER — Other Ambulatory Visit: Payer: Self-pay | Admitting: Family Medicine

## 2010-08-18 DIAGNOSIS — R928 Other abnormal and inconclusive findings on diagnostic imaging of breast: Secondary | ICD-10-CM

## 2010-08-19 ENCOUNTER — Ambulatory Visit: Payer: Self-pay | Admitting: Family Medicine

## 2010-08-26 ENCOUNTER — Encounter: Payer: Self-pay | Admitting: Family Medicine

## 2010-09-23 ENCOUNTER — Encounter: Payer: Self-pay | Admitting: Family Medicine

## 2010-09-23 ENCOUNTER — Ambulatory Visit (INDEPENDENT_AMBULATORY_CARE_PROVIDER_SITE_OTHER): Payer: Medicare Other | Admitting: Family Medicine

## 2010-09-23 VITALS — BP 122/82 | HR 73 | Temp 98.4°F | Ht 62.0 in | Wt 154.0 lb

## 2010-09-23 DIAGNOSIS — R21 Rash and other nonspecific skin eruption: Secondary | ICD-10-CM

## 2010-09-23 MED ORDER — DOXYCYCLINE HYCLATE 100 MG PO CAPS: 100.0000 mg | ORAL_CAPSULE | Freq: Two times a day (BID) | ORAL | Status: AC

## 2010-09-23 NOTE — Progress Notes (Signed)
Subjective:     Amanda Kane is a 70 y.o. female who presents for evaluation of a rash involving the chest, face, lower extremity and upper extremity. Rash started 5 days ago.  Pt returned from [place] yesterday.  Was staying [place].  On day 2 of her stay, developed this itchy rash.  Has been taking Benadryl 25 mg three times daily which is helping with the itching.  No new lesions since she returned from her trip.   Lesions are pink, and raised in texture. Rash has changed over time. Rash is pruritic. Associated symptoms: none. Patient denies: abdominal pain, congestion, cough, fever, headache, myalgia, nausea, sore throat and vomiting. Patient has had contacts with similar rash. Patient has had new exposures (soaps, lotions, laundry detergents, foods, medications, plants, insects or animals).  The PMH, PSH, Social History, Family History, Medications, and allergies have been reviewed in [facility], and have been updated if relevant.   Review of Systems Pertinent items are noted in HPI.    Objective:  BP 122/82  Pulse 73  Temp(Src) 98.4 F (36.9 C) (Oral)  Ht 5\' 2"  (1.575 m)  Wt 154 lb (69.854 kg)  BMI 28.17 kg/m2  General:  alert, cooperative and no distress  Skin:  lesion noted on face and bilateral upper arms and lower legs.     Assessment:    contact dermatitis: versus ?infected bug bites.    Plan:  Will cover with 1 week course of Doxycyline, continue Benadryl. Advised washing all of her clothes and sheets, etc.   Follow up in 1 week.

## 2010-09-24 ENCOUNTER — Ambulatory Visit: Payer: Self-pay | Admitting: Family Medicine

## 2010-09-29 NOTE — Assessment & Plan Note (Signed)
NAMESEANNE, CHIRICO NO.:  [id number]   MEDICAL RECORD NO.:  [id number]          PATIENT TYPE:  POB   LOCATION:  [facility] at [facility]         FACILITY:  [facility]   PHYSICIAN:  Allie Bossier, MD        DATE OF BIRTH:  Mar 08, 1941   DATE OF SERVICE:  03/02/2010                                  CLINIC NOTE   Ms. Nemmers is a 70 year old married white G2, P2 who is here for her  annual exam.  She has no GYN complaints.  She does say that it feels  like she might have a bladder infection.  Of note, her urinalysis is  completely negative.   PAST MEDICAL HISTORY:  Interstitial cystitis, hard of hearing,  gastroparesis, esophageal stricture, fibromyalgia, history of malignant  colon polyp, degenerative disk disease in her neck status post MRI this  year, hypertension, thyroid disease, and obesity.   REVIEW OF SYSTEM:  She has been married for 44 years.  She is still  sexually active and uses lubricant.  The remainder of review of systems  questions are negative.  Her last mammogram was last month.  Her last  colonoscopy was approximately in 2009.   PREVIOUS SURGERIES:  Cystoscopy in 2006, endoscopy, breast biopsy on the  left times many, esophageal dilation, tubal ligation, appendectomy,  otic surgery.   No known drug allergies.  No latex allergies.   FAMILY HISTORY:  Negative for breast, GYN, and colon malignancies.   SOCIAL HISTORY:  She has approximately half a glass of wine every night.   MEDICATIONS:  Estrace cream in the vagina about once a month she says,  Pyridium 100 mg when necessary, hyoscyamine 0.125 mg sublingual p.r.n.,  Prevacid 30 mg daily, multivitamin and vitamin D daily.   PHYSICAL EXAMINATION:  GENERAL:  Well-nourished, well-hydrated very  pleasant white female.  VITAL SIGNS:  Height 5 feet 2 inches, weight 150 pounds, blood pressure  166/81, pulse 77.  HEENT:  Normal.  BREASTS:  Normal.  HEART:  Regular rate and rhythm.  LUNGS:  Clear to  auscultation bilaterally.  ABDOMEN:  Benign.  PELVIC:  External genitalia, atrophic but no problems.  Cervix appears  normal.  Pap smears obtained.  Uterus is normal size and shape,  anteverted, mobile.  Adnexa nontender.  No masses.   ASSESSMENT AND PLAN:  Annual exam.  I checked Pap smear.  Recommended  self-breast and self-vulvar exams monthly.  Recommended she continue her  mammograms as recommended (she has another one in 6 months).  With regard to her bladder irritation, I have reassured that her  urinalysis is negative and suggested that with interstitial cystitis,  perhaps not drinking wine might help.  We reviewed the interstitial  cystitis diet.      Allie Bossier, MD     MCD/MEDQ  D:  03/02/2010  T:  03/03/2010  Job:  [id number]

## 2010-09-29 NOTE — Op Note (Signed)
NAMEEUPHA, LOBB                   ACCOUNT NO.:  [id number]   MEDICAL RECORD NO.:  [id number]          PATIENT TYPE:  AMB   LOCATION:  [facility]                           [facility]ACILITY:  [facility]   PHYSICIAN:  Allie Bossier, MD        DATE OF BIRTH:  12-17-1940   DATE OF PROCEDURE:  DATE OF DISCHARGE:                               OPERATIVE REPORT   PREOPERATIVE DIAGNOSIS:  Postmenopausal bleeding.   POSTOPERATIVE DIAGNOSES:  Postmenopausal bleeding and uterine polyp.   PROCEDURE:  1. Diagnostic hysteroscopy.  2. Hysteroscopic resection of polyp.  3. Curettage.   SURGEON:  Myra C. Marice Potter, MD.   ANESTHESIA:  LMA.  Cristela Blue, MD.   COMPLICATIONS:  None.   ESTIMATED BLOOD LOSS:  Minimal.   SPECIMEN:  Uterine polyp.   PROCEDURE AND FINDINGS:  The risks, benefits, alternatives of surgery  were explained and accepted.  Consents were signed.  She was taken to  the operating room, placed in the dorsal lithotomy position.  General  anesthesia was applied after she was comfortable.  Her vagina was  prepped and draped in the usual sterile fashion.  Bimanual exam revealed  a normal size and shape retroverted mobile uterus with nonenlarged  adnexa.  A speculum was placed.  The cervix was grasped on the anterior  lip with a single-tooth tenaculum.  The cervix was gently dilated with  Hegar dilators to accommodate a hysteroscope.  Diagnostic hysteroscopy  was done and a large polyp was seen arising from the posterior aspect of  the uterine wall.  I used uterine dressing forceps to remove a polyp the  size of my thumb, then re-looked with hysteroscopy, and a small portion  of polyp was still left at the base.  I then switched to the  resectoscope to remove it.  The base was removed.  No bleeding was noted  from the site.  Glycine was the distention medium used and a net deficit  of 50 mL was noted at the end of the case.  Sharp curettage was done in  all quadrants of the uterus.  There was minimal tissue  obtained but a  gritty sensation was appreciated throughout the uterus.  The remainder  of the uterus was atrophic.  She was taken to recovery in stable  condition with the instrument, sponge and needle counts correct.     Allie Bossier, MD  Electronically Signed    MCD/MEDQ  D:  07/18/2007  T:  07/18/2007  Job:  [id number]

## 2010-09-29 NOTE — H&P (Signed)
Amanda Kane, KUBIN NO.:  [id number]   MEDICAL RECORD NO.:  [id number]          PATIENT TYPE:  AMB   LOCATION:  [facility]                           [facility]   PHYSICIAN:  Allie Bossier, MD        DATE OF BIRTH:  10-07-1940   DATE OF ADMISSION:  07/18/2007  DATE OF DISCHARGE:                              HISTORY & PHYSICAL   The patient is a 70 year old married white gravida 2, para 2, who is  followed at [facility] for her annual exams.  Her Pap smear in January  was normal.  At that time she was complaining of some postmenopausal  bleeding.  This has been for the last year (since February of 2008).  She had an endometrial biopsy done at [facility] in August of 2008  which was normal, but an ultrasound done after this showed a thickened  endometrium at 1.3 cm.  She understands that further workup is necessary  for this, in the form of a D&C and hysteroscopy.   PAST MEDICAL HISTORY:  Hypertension.  She is hard of hearing,  gastroparesis, fibromyalgia, esophageal stricture, history of malignant  colon polyps in the distant past.  Hypothyroidism.  Overweight.   PAST SURGICAL HISTORY:  Cystoscopy by Dr. Sheppard Penton in [place].  Endoscopy  and esophageal dilation.  Biopsies of her left breast.  Tubal ligation,  appendectomy, otic surgery.   SOCIAL HISTORY:  She drinks wine nightly.  She says that a pack of  cigarettes will last her a year, but she does smoke an occasional  cigarette.  No latex allergies.   ALLERGIES:  No known drug allergies.   MEDICATIONS:  1. Aciphex daily.  2. Levothyroxine daily.  3. Elmiron daily.  4. Fluoxetine 20 mg two to three times a week.  5. Nexium 20 mg daily.  6. Uristat daily.  7. Vagifem tablets two times per week.  8. Hydrocortisone cream per vulva as necessary.   REVIEW OF SYSTEMS:  She has been happily married for the last 42 years.  Her mammogram was done in June of 2008 and was normal.  She has had a  bone density scan  in the distant past that diagnosed her with  osteoporosis, but she has refused treatment for this.  She has had  yearly colonoscopy for many years and recently had one in 2005.  She was  told to follow up in 2010.   PHYSICAL EXAMINATION:  VITAL SIGNS:  Weight 150 pounds, height 5 feet 2  inches, pulse 78.  HEENT:  Normal.  HEART:  Regular rate and rhythm.  LUNGS:  Clear to auscultation bilaterally.  BREASTS:  Normal.  PELVIC:  External genitalia normal.   FAMILY HISTORY:  Negative for breast, GYN, and colon malignancies.   ASSESSMENT:  Postmenopausal bleeding with thickened endometrium (13 mm)  on ultrasound.  We will plan for [facility] hysteroscopy.  The patient missed  her preoperative appointment yesterday due to inclement weather.  At  that time she was supposed to have received 400 mcg of Cytotec for  cervical ripening.  Due to her inability to make the appointment she did  not receive this medication and I have further discussed the risks of  perforation and inability to complete the procedure today should a  perforation occur.      Allie Bossier, MD  Electronically Signed     MCD/MEDQ  D:  07/18/2007  T:  07/18/2007  Job:  [id number]

## 2010-09-29 NOTE — Assessment & Plan Note (Signed)
[facility]                         GASTROENTEROLOGY OFFICE NOTE   Amanda Kane, Amanda Kane                          MRN:          [id number]  DATE:10/12/2006                            DOB:          Nov 05, 1940    PROBLEM:  Chest pain.   Amanda Kane has returned for ongoing evaluation. She now complains just of  some chest achiness. She has occasional lower abdominal discomfort which  has responded well in the past to Hyoscyamine. She is on a regimen of  Nexium q.a.m. and Reglan 5 mg q.h.s. She complains of chronic fatigue.   PHYSICAL EXAMINATION:  Pulse 80, blood pressure 130/90, weight 154.   IMPRESSION:  1. Chest pain. I strongly suspect this is due to fibromyalgia.  2. Gastroesophageal reflux disease with special nocturnal symptoms.  3. Nonspecific lower abdominal pain.   RECOMMENDATIONS:  1. Trial of Zegerid 40 mg q.h.s. while holding Reglan and Nexium.  2. NuLev p.r.n.  3. Check CBC, CMET and thyroid test.     Barbette Hair. Arlyce Dice, MD,FACG  Electronically Signed    RDK/MedQ  DD: 10/12/2006  DT: 10/12/2006  Job #: [id number]   cc:   Arta Silence, MD

## 2010-09-29 NOTE — Assessment & Plan Note (Signed)
NAMEMORENA, MCKISSACK NO.:  [id number]   MEDICAL RECORD NO.:  [id number]          PATIENT TYPE:  POB   LOCATION:  [facility]         FACILITY:  [facility]   PHYSICIAN:  Allie Bossier, MD        DATE OF BIRTH:  1940-07-07   DATE OF SERVICE:  05/30/2007                                  CLINIC NOTE   Amanda Kane is a a 70 year old married white gravida 2, para 2, who comes  here for annual exam.  She is also up follow-up from last week's visit  when she complained of vaginal irritation. At last week's visit, I  checked an HSV II culture which was negative. Also recommended that she  start hydrocortisone cream to her vagina two to three times a day and  discontinue her Premarin vaginal cream in favor of Vagifem tablets twice  a week.  She has done both of these things and reports an improvement of  her vulvar discomfort.  She still has interstitial cystitis symptoms.  A  culture I did the last week has shown greater than 100,000 of proteus  sensitive to Bactrim and I have given her a prescription for this today.  Her other issues postmenopausal bleeding.  She has had intermittent  episodes since February 2008. She had an endometrial biopsy done here  12/2006 which was normal, but an ultrasound done last this week showed a  thickened endometrium at 13 mm.  She understands that further workup  will be necessary for this.   PAST MEDICAL HISTORY:  1. Probable hypertension.  2. Postmenopausal bleeding, postcoital bleeding.  3. She is hard of hearing.  4. She has gastroparesis.  5. Esophageal stricture.  6. Fibromyalgia.  7. History of malignant colon polyps in the distant past.   REVIEW OF SYSTEMS:  She is married for last 42 years happily. She had  last Pap smear in 2000 and is due today.  Mammogram was June 2008 and  normal per her. She had yearly colonoscopies for many years and most  recently had a colonoscopy in 2005 at which time she was told to follow  up in  5 years.   PAST SURGICAL HISTORY:  1. She had a cystoscopy by Dr. Sheppard Penton in [place].  2. Endoscopy and esophageal dilation.  3. She said many biopsies of her left breast.  4. She said tubal ligation.  5. Appendectomy.  6. Otic surgery.  7. Thyroid disease.  8. Hypothyroidism.  9. Obesity.   No latex allergies.  NO KNOWN DRUG ALLERGIES.   SOCIAL HISTORY:  She drinks wine nightly.   MEDICATIONS:  1. Takes Aciphex daily.  2. Levothyroxine.  3. Alviron daily.  4. Fluoxetine daily.  5. Nexium 20 mg daily.  6. Uristat as necessary.  7. Vagifem tablets two times per week.  8. Hydrocortisone cream per vulva as necessary.   PHYSICAL EXAM:  She is 150 pounds, height 5 feet 2 inches. Pulse 78.  HEENT:  Normal.  HEART:  Regular rate rhythm.  LUNGS:  Clear sensation bilaterally.  Breast exam normal.  No masses.  EXTERNAL GENITALIA:  The diffuse erythema from last  week is decreased.  There is still minimal amount of diffuse erythema.  There is moderate  amount of vaginal atrophy.  Cervix no lesions, atrophic. Bimanual exam  normal size and shape, anteverted uterus.  Non-enlarged adnexa.   ASSESSMENT/PLAN:  1. Annual exam.  I have checked a Pap smear.  Her mammogram is up-to-      date.  I have recommended self-breast and self vulvar exams      monthly.  2. Vulvar irritation/erythema.  This is possibly due to an allergic      reaction and would recommend continue the Vagifem tablets twice a      week and the hydrocortisone cream as necessary. With regard to      postmenopausal bleeding and thickened endometrial on ultrasound, I      will schedule a hysteroscopy and D&C.  We have discussed this case.      I will preoperatively give her 400 micrograms of Cytotec the night      before surgery for cervical ripening.      Allie Bossier, MD     MCD/MEDQ  D:  05/30/2007  T:  05/30/2007  Job:  [id number]

## 2010-09-30 ENCOUNTER — Telehealth: Payer: Self-pay | Admitting: *Deleted

## 2010-09-30 MED ORDER — LEVOTHYROXINE SODIUM 75 MCG PO TABS: 75.0000 ug | ORAL_TABLET | Freq: Every day | ORAL | Status: DC

## 2010-09-30 NOTE — Telephone Encounter (Signed)
Any side effect is truly possible with any medication so it's worth a try. I will send Synthroid rx to [name].

## 2010-09-30 NOTE — Telephone Encounter (Signed)
Patient wants to know if the bumps and itching could be coming from the Levothyroxine?  She had forgot to take it this morning and when she remembered to take it she noticed that she started itching again?  Can she switch to Synthroid to see if that helps?  Please advise.

## 2010-09-30 NOTE — Telephone Encounter (Signed)
Patient advised as instructed via telephone. 

## 2010-09-30 NOTE — Telephone Encounter (Signed)
Pt was seen last week for itching and bug bites, she is calling to report that she has no new bumps but she is still itching and feels like she has needles sticking in her.  She finished doxycycline yesterday.  She has not taken benedryl this morning, she says that helps a little bit.  Please advise.

## 2010-09-30 NOTE — Telephone Encounter (Signed)
Continue Benadryl.  We can refer to dermatology if the bumps have not improved.

## 2010-10-02 NOTE — Assessment & Plan Note (Signed)
[facility]                         GASTROENTEROLOGY OFFICE NOTE   MICHELA, HERST                          MRN:          [id number]  DATE:07/11/2006                            DOB:          02/02/1941    PROBLEM:  Chest pain.   REASON FOR RETURN:  For her scheduled GI followup.   She underwent esophageal dilatation to 18-mm.  Esophagitis was seen at  the GE junction and proven by biopsy.  Ms. Herschberger reports improvement in  her swallowing but she is complaining now of fairly constant chest pain.  It is described as a pressure throughout her chest that is there  constantly and slightly relieved by eating.  It is not exercise-related.  She continues on Nexium 40 mg now twice a day.  She does complain of  some crampy lower bowel pain as well.  She denies pyrosis, sore throat,  or coughing.   EXAMINATION:  Pulse 85, blood pressure 120/80, weight 149.   IMPRESSION:  Persistent chest discomfort.  Symptoms could be due to acid  reflux.  They do not appear to be cardiac though this must be  considered.   RECOMMENDATIONS:  1. Check ECG and chest x-ray.  2. Trial of Zegerid 40 mg twice a day.  Failing this, I have given her      Prevacid samples.  3. Gastric emptying scan.     Barbette Hair. Arlyce Dice, MD,FACG  Electronically Signed    RDK/MedQ  DD: 07/11/2006  DT: 07/11/2006  Job #: [id number]

## 2010-10-02 NOTE — Assessment & Plan Note (Signed)
[facility]                         GASTROENTEROLOGY OFFICE NOTE   SAANVI, HAKALA                          MRN:          [id number]  DATE:06/07/2006                            DOB:          [birth year]    PROBLEM:  1. Sore throat.  2. Dysphagia.   REASON:  Ms. Yam has returned complaining of a sore throat.  This has  been occurring intermittently for the past several months.  Pain is on  the left side of the throat only.  She also has dysphagia to solids and  intermittent pyrosis.  She remains on Nexium 40 mg daily.   She denies cough.   OTHER MEDICATIONS:  Nicomide, Elmiron and levothyroxine.   PHYSICAL EXAMINATION:  Pulse 80, blood pressure 120/70, weight 149.  HEENT: Within normal limits.  CHEST:  Clear.   IMPRESSION:  1. Dysphagia - Likely secondary to recurrent esophageal stricture.  2. Sore throat.  This could be related to acid reflux.   RECOMMENDATION:  1. I have given Ms. Myrick samples of Zegerid and Protonix.  2. Upper endoscopy with Savary dilatation.     Barbette Hair. Arlyce Dice, MD,FACG  Electronically Signed    RDK/MedQ  DD: 06/07/2006  DT: 06/07/2006  Job #: [id number]

## 2010-10-02 NOTE — Assessment & Plan Note (Signed)
[facility]                                 ON-CALL NOTE   JAVAE, BRAATEN                          MRN:          [id number]  DATE:11/26/2006                            DOB:          1941/01/26    PRIMARY CARE PHYSICIAN:  Dr. Ermalene Searing.   Ms. Jarvis calls in today stating that she has been having itching all  over.  She also states that her eyes are very itchy.  Initially she  thought it was due to allergies.  She reports that it started after she  got a yeast infection.  She treated it with over-the-counter Monistat.  Soon thereafter is when she started having the pruritus.  The symptoms  are present as of today.  She denies any rashes.  She denies any  shortness of breath or dyspnea on exertion.   PLAN:  Advised the patient to try over-the-counter Benadryl every 4  hours to see if her symptoms were alleviated.  If there was no  improvement, she is to let us know.   ADDENDUM:  The patient was called 30 minutes to an hour later and she  reports that she took Benadryl.  She has noticed some welts on the back  of her neck.  They are pruritic in nature.  I advised the patient that  she needs to be seen today, given that now she has gone from pruritus to  what sounds to be hives.  She states that she lives in [place] and  will go to an Urgent Care [place].     Leanne Chang, M.D.  Electronically Signed    LA/MedQ  DD: 11/26/2006  DT: 11/27/2006  Job #: [id number]

## 2010-10-02 NOTE — Assessment & Plan Note (Signed)
[facility]                         GASTROENTEROLOGY OFFICE NOTE   DELLE, ANDRZEJEWSKI                          MRN:          [id number]  DATE:08/10/2006                            DOB:          1940-11-02    PROBLEM:  Chest pain.   REASON:  Mr. Rubalcava has returned for scheduled followup. Gastric emptying  scan was remarkable for delayed emptying. She was started on Reglan 10  mg 1/2 hour a.c. and h.s. and reports improvement in her nocturnal  regurgitation. She is now able to sleep. She does report some diarrhea.  Chest discomfort continues, however. This is not exercise related. It is  described as a fullness in her lower chest and mid chest. She continues  with Nexium once a day. History is notable for fibromyalgia. Her chest x-  ray was unremarkable.   On exam, pulse 80, blood pressure 122/82, weight 152.   IMPRESSION:  1. Gastroesophageal reflux disease - improved with Reglan.  2. Diarrhea - probably secondary to Reglan.  3. Chest discomfort - I suspect this is related to fibromyalgia though      persistent acid reflux is still a possibility. I doubt this is a      cardiac etiology.   RECOMMENDATIONS:  1. Trial of Aciphex 20 mg a day. Failing this, she will increase her      Nexium to twice a day.  2. Lower the Reglan to 5 mg one half hour a.c. and h.s.  3. Consider 24-hour pH study.     Barbette Hair. Arlyce Dice, MD,FACG  Electronically Signed    RDK/MedQ  DD: 08/10/2006  DT: 08/10/2006  Job #: [id number]   cc:   Arta Silence, MD

## 2010-11-03 ENCOUNTER — Other Ambulatory Visit: Payer: Self-pay | Admitting: *Deleted

## 2010-11-03 MED ORDER — SYNTHROID 75 MCG PO TABS: 75.0000 ug | ORAL_TABLET | Freq: Every day | ORAL | Status: DC

## 2010-11-03 NOTE — Telephone Encounter (Signed)
Called [employer] at [phone] spoke to Diane and called in Synthroid (brand name) #90 with 3 refills.  Patient advised.

## 2010-11-03 NOTE — Telephone Encounter (Signed)
Pt states that she had been taking levothyrxine but had some itching with that.  She then changed to synthroid and that is working better for her.  She would like a new script for brand only sent to [facility].  She says we have to call this in and give her ID # of [id number].  [employer] doesn't accept [employer].

## 2010-11-03 NOTE — Telephone Encounter (Signed)
Ok to call this in- 90 tablets with 3 refills.

## 2011-01-13 ENCOUNTER — Encounter: Payer: Self-pay | Admitting: Obstetrics & Gynecology

## 2011-01-13 ENCOUNTER — Ambulatory Visit (INDEPENDENT_AMBULATORY_CARE_PROVIDER_SITE_OTHER): Payer: Medicare Other | Admitting: Obstetrics & Gynecology

## 2011-01-13 ENCOUNTER — Other Ambulatory Visit: Payer: Self-pay | Admitting: Obstetrics & Gynecology

## 2011-01-13 VITALS — BP 188/89 | HR 72 | Ht 62.0 in

## 2011-01-13 DIAGNOSIS — N3010 Interstitial cystitis (chronic) without hematuria: Secondary | ICD-10-CM | POA: Insufficient documentation

## 2011-01-13 DIAGNOSIS — R350 Frequency of micturition: Secondary | ICD-10-CM

## 2011-01-13 DIAGNOSIS — N950 Postmenopausal bleeding: Secondary | ICD-10-CM

## 2011-01-13 DIAGNOSIS — R319 Hematuria, unspecified: Secondary | ICD-10-CM

## 2011-01-13 LAB — POCT URINALYSIS DIPSTICK
Bilirubin, UA: NEGATIVE
Ketones, UA: NEGATIVE
Nitrite, UA: NEGATIVE

## 2011-01-13 MED ORDER — PENTOSAN POLYSULFATE SODIUM 100 MG PO CAPS: ORAL_CAPSULE | ORAL | Status: DC

## 2011-01-13 NOTE — Progress Notes (Signed)
  Subjective:    Patient ID: Amanda Kane, female    DOB: April 04, 1941, 70 y.o.   MRN: [id number]  HPI Mrs. Thedford notes urinary frequency, pelvic pressure, postmenopausal bleeding for one week. Not gross hematuria, no fever  Past Medical History  Diagnosis Date  . Arthritis   . Esophageal reflux   . Myalgia and myositis, unspecified   . Pure hypercholesterolemia   . Unspecified hypothyroidism   . Benign neoplasm of colon   . Interstitial cystitis   Current outpatient prescriptions:FLUoxetine (PROZAC) 20 MG tablet, Take 20 mg by mouth every other day.  , Disp: , Rfl: ;  lansoprazole (PREVACID) 15 MG capsule, Take 15 mg by mouth as needed.  , Disp: , Rfl: ;  phenazopyridine (PYRIDIUM) 100 MG tablet, Take 100 mg by mouth 3 (three) times daily as needed.  , Disp: , Rfl: ;  SYNTHROID 75 MCG tablet, Take 1 tablet (75 mcg total) by mouth daily., Disp: 90 tablet, Rfl: 3 Niacinamide-Zn-Cu-Methylfolate (NICOMIDE) 750-25-1.5-0.5 MG TABS, Take 1 tablet by mouth daily. , Disp: , Rfl:  Allergies  Allergen Reactions  . Atorvastatin   . Celecoxib     REACTION: Itch  . Ciprofloxacin   . Ezetimibe     REACTION: Aches  . Metronidazole   . Pregabalin     REACTION: Numb  . Simvastatin     REACTION: Aches  . Sulfonamide Derivatives    Past Surgical History  Procedure Date  . Hysteroscopy w/d&c      Dove  . Appendectomy   . Inner ear surgery   . Breast lumpectomy   . Tubal ligation   . Eye surgery     cateracts    D and C 07/2007- endometrial polyps   Review of Systems Genito-Urinary ROS: positive for - pelvic pain, urinary frequency/urgency and vaginal bleeding     Objective:   Physical Exam NAD, mildly anxious Pelvic exam: normal external genitalia, vulva, vagina, cervix, uterus and adnexa, VULVA: normal appearing vulva with no masses, tenderness or lesions, VAGINA: normal appearing vagina with normal color and discharge, no lesions, atrophic, CERVIX: dark blood at cervix, UTERUS: uterus  is normal size, shape, consistency and nontender, ADNEXA: normal adnexa in size, nontender and no masses.        Assessment & Plan:  Possible UTI, postmenopausal bleeding Urine culture, RX Elmiron 200mg  BID Pelvic U/S RTC 1-2 weeks

## 2011-01-13 NOTE — Progress Notes (Signed)
Started spotting last Friday, increasingly worse pressure and cramping in lower abdomen,  Increased urination, waking her up in the middle of the night to have to use the restroom to urinate.  She feels as if her Uterus is pressing on her bladder, creating this pressure.

## 2011-01-13 NOTE — Progress Notes (Signed)
Addended by: Barbara Cower on: 01/13/2011 12:18 PM   Modules accepted: Orders

## 2011-01-13 NOTE — Patient Instructions (Signed)
Postmenopausal Bleeding Menopause is the gradual end of ovulation and menstrual periods. Postmenopausal bleeding is bleeding from the uterus 12 months or more after menstrual periods have stopped. Postmenopausal bleeding is different from the few, irregular periods that happen around the time of menopause. Postmenopausal bleeding is common, but not normal. Tell your caregiver if you have any bleeding after menopause.  CAUSES  Hormone therapy (HRT). This is the most common cause of postmenopausal bleeding.  Cervical, ovarian or endometrial cancer.   Thinning of the uterus endometrium (uterine atrophy).   Thyroid and other medical diseases.   Medications other than hormones.   Endometrium infection (endometritis).  Estrogen-secreting tumors in other parts of the body.   Cervical lesions (polps/infection).   Endometrial polyps.   Uterine tumors (fibroid/polyps).   Being very overweight (obese).   SYMPTOMS Postmenopausal bleeding can start in different parts of the reproductive system.   Bleeding from the vagina may happen. This happens because when estrogen secretion stops, the vagina dries out and can weaken (atrophy). This is the most common cause of bleeding from the lower reproductive tract.   Lesions and cracks on the vulva may also bleed.   Sometimes bleeding happens after sexual intercourse.   Bleeding from the cervix if there are polyps, cancer or an infection.   Bleeding can happen with or without a related infection.  DIAGNOSIS  The caregiver will ask for a detailed history of how long bleeding has been going on. A woman should keep a record of the time, the length, how often and amount of bleeding. She should tell the caregiver about any medications she is taking, especially any hormones or steroids.   The caregiver will do a pelvic exam and PAP test. The vulva and vagina are looked at for signs of atrophy. The caregiver will feel for any sign of uterine fibroids.  Depending on the exam results, more testing may be needed.  PROCEDURES  Biopsies may be taken. This is when the caregiver studies a piece of tissue from the body. They look for tissue that is not normal.   Endometrial biopsy takes tissue from the uterine lining.   Cervical biopsy takes tissue from the cervix.   Dilatation and curettage (D & C) is often needed for a diagnosis. This is the best test for diagnosing uterine cancer.   Hysteroscopy is a procedure that uses a long tube with a light. This tube looks inside the uterus.   A vaginal probe ultrasound may be done. This measures the thickness of the endometrium. This procedure does not often show polyps and fibroids in the uterus however.   A saline infusion sonohysterography (SIS) may be done. A salt water (saline) solution is injected into the uterus with a small tube (catheter) before a vaginal probe is put in. The presence of liquid in the uterus helps make any fibroids or polyps more obvious.  TREATMENT Treatment depends on the cause of the bleeding.  It is common for women just beginning HRT to have some bleeding. Most women who are on HRT also take progesterone with estrogen if they still have their uterus. They may have monthly withdrawal bleeding. This is a normal side effect that often does not need treatment. Taking a low dose of estrogen and progesterone daily can prevent having a menstrual period.   Postmenopausal bleeding due to vulva or vaginal bleeding can be treated with estrogen creams, patches or pills.   If cancer is found, some form of surgery is needed. The uterus,   cervix, ovaries and fallopian tubes may all be removed depending on the type and location of the cancer. If the problem is estrogen or androgen-producing tumors elsewhere in the body, these must also be surgically removed.   Postmenopausal bleeding that is not due to cancer and cannot be controlled by any other treatment usually requires a hysterectomy.  This operation is not without risk and possible complications.   If the cause is endometrium thinning, hormones are given.   If there is endometrial hyperplasia, progesterone may be given and/or D and C may be done.   If there are polyps in the uterus, a hysteroscopy and/or D and C is done to remove them.   If fibroids are present, they can be removed or a hysterectomy may be needed.   If the problem is medical, thyroid or other medical treatment is done.   If medication is causing the problem, changing or stopping the medication may be needed.  HOME CARE INSTRUCTIONS  Postmenopausal bleeding is not a preventable disorder. However, maintaining a healthy weight will decrease the chances of it happening.   See your caregiver if you are missing menstrual periods all the time.   A PAP smear is done to screen for cervical cancer.   The first PAP smear should be done at age 21.   Between ages 21 and 29, PAP smears are repeated every 2 years.   Beginning at age 30, you are advised to have a PAP smear every 3 years as long as your past 3 PAP smears have been normal.   Some women have medical problems that increase the chance of getting cervical cancer. Talk to your caregiver about these problems. It is especially important to talk to your caregiver if a new problem develops soon after your last PAP smear. In these cases, your caregiver may recommend more frequent screening and Pap smears.   The above recommendations are the same for women who have or have not gotten the vaccine for HPV (Human Papillomavirus).   If you had a hysterectomy for a problem that was not a cancer or a condition that could lead to cancer, then you no longer need Pap smears.   If you are between ages 65 and 70, and you have had normal Pap smears going back 10 years, you no longer need Pap smears.   If you have had past treatment for cervical cancer or a condition that could lead to cancer, you need Pap smears and  screening for cancer for at least 20 years after your treatment.   See your caregiver if you have any kind of bleeding after menopause.  SEEK MEDICAL CARE IF:  You have any kind of vaginal bleeding after menopause.   You develop bleeding with sexual intercourse.   You are still having menstrual periods after age 55.   You are gaining too much weight.  Document Released: 08/11/2005 Document Re-Released: 05/25/2009 [employer] Patient Information 2011 [employer]. 

## 2011-01-15 LAB — CULTURE, URINE COMPREHENSIVE
Colony Count: NO GROWTH
Organism ID, Bacteria: NO GROWTH

## 2011-01-19 ENCOUNTER — Ambulatory Visit (HOSPITAL_COMMUNITY)
Admission: RE | Admit: 2011-01-19 | Discharge: 2011-01-19 | Disposition: A | Discharge status: Home / Self Care | Payer: Medicare Other | Source: Ambulatory Visit | Attending: Obstetrics & Gynecology | Admitting: Obstetrics & Gynecology

## 2011-01-19 DIAGNOSIS — N950 Postmenopausal bleeding: Secondary | ICD-10-CM

## 2011-01-21 ENCOUNTER — Ambulatory Visit (INDEPENDENT_AMBULATORY_CARE_PROVIDER_SITE_OTHER): Payer: Medicare Other | Admitting: Obstetrics & Gynecology

## 2011-01-21 VITALS — BP 145/92 | HR 75 | Wt 148.0 lb

## 2011-01-21 DIAGNOSIS — N950 Postmenopausal bleeding: Secondary | ICD-10-CM

## 2011-01-21 NOTE — Progress Notes (Signed)
  Subjective:    Patient ID: Amanda Kane, female    DOB: 02/04/1941, 70 y.o.   MRN: [id number]  HPI Mrs. Zuniga is here for follow up of her gyn ultrasound.  She had an occasion of PMB and hematuria recently and came in to see Dr. Debroah Loop.  An ultrasound showed an endometrial lining of 5 mm.   Review of Systems    Her mammogram was done recently. Objective:   Physical Exam        Assessment & Plan:  PMB- now resolved. I have counseled her that an endometrial strip of 5 mm effectively rules out uterine cancer in about 97%.  I have done embx and hysteroscopy and d&c on her in the past.  We have agreed that if her PMB recurrs, we will restart the work up with another ultrasound.  She would like to get her next annual exam in the Spring. I have agreed.

## 2011-02-08 LAB — CBC
HCT: 41.2
Hemoglobin: 14.1
MCV: 94.2
RBC: 4.38
WBC: 10.1

## 2011-02-11 ENCOUNTER — Other Ambulatory Visit (INDEPENDENT_AMBULATORY_CARE_PROVIDER_SITE_OTHER): Payer: Medicare Other

## 2011-02-11 DIAGNOSIS — N390 Urinary tract infection, site not specified: Secondary | ICD-10-CM

## 2011-02-11 MED ORDER — NITROFURANTOIN MONOHYD MACRO 100 MG PO CAPS: 100.0000 mg | ORAL_CAPSULE | Freq: Two times a day (BID) | ORAL | Status: AC

## 2011-02-12 LAB — POCT URINALYSIS DIPSTICK
Bilirubin, UA: NEGATIVE
Ketones, UA: NEGATIVE

## 2011-02-12 MED ORDER — AMOXICILLIN 500 MG PO CAPS: 500.0000 mg | ORAL_CAPSULE | Freq: Three times a day (TID) | ORAL | Status: AC

## 2011-02-12 NOTE — Progress Notes (Addendum)
Patient comes today for symptoms of Kane urinary tract infection.  She has Kane history of interstitial cystitis...  She states that the symptoms have been worsening over one week.  Urine dip was positive for nitrates, large leukocytes and 2+ blood.  We will send for culture and call in Macrobid for symptoms as patient has several allergies to antibiotics.  02/12/11 Addendum Prescribed Amoxicillin instead of Macrobid.  Patient called, message left on her mobile phone and informed of change in therapy.  Amanda Kane,Amanda Kane 02/12/2011 3:25 PM

## 2011-02-12 NOTE — Progress Notes (Signed)
Addended by: Jaynie Collins A on: 02/12/2011 03:28 PM   Modules accepted: Orders

## 2011-02-15 LAB — CULTURE, URINE COMPREHENSIVE: Colony Count: >=100000

## 2011-03-16 ENCOUNTER — Ambulatory Visit: Payer: Self-pay | Admitting: Rheumatology

## 2011-04-21 ENCOUNTER — Ambulatory Visit (INDEPENDENT_AMBULATORY_CARE_PROVIDER_SITE_OTHER): Payer: Medicare Other

## 2011-04-21 DIAGNOSIS — Z23 Encounter for immunization: Secondary | ICD-10-CM

## 2011-04-26 ENCOUNTER — Other Ambulatory Visit: Payer: Self-pay | Admitting: Family Medicine

## 2011-04-26 NOTE — Telephone Encounter (Signed)
Rx faxed to [facility] at [phone].

## 2011-05-14 ENCOUNTER — Telehealth: Payer: Self-pay | Admitting: Internal Medicine

## 2011-05-14 ENCOUNTER — Ambulatory Visit: Payer: Self-pay | Admitting: General Practice

## 2011-05-26 NOTE — Telephone Encounter (Signed)
Opened in error by Pam Clement 

## 2011-05-28 ENCOUNTER — Ambulatory Visit: Payer: Self-pay | Admitting: General Practice

## 2011-06-23 ENCOUNTER — Other Ambulatory Visit: Payer: Self-pay | Admitting: *Deleted

## 2011-06-23 MED ORDER — PHENAZOPYRIDINE HCL 100 MG PO TABS: 100.0000 mg | ORAL_TABLET | Freq: Three times a day (TID) | ORAL | Status: DC | PRN

## 2011-06-23 NOTE — Telephone Encounter (Signed)
Rx called to [facility]. 

## 2011-07-05 ENCOUNTER — Telehealth: Payer: Self-pay | Admitting: *Deleted

## 2011-07-05 DIAGNOSIS — N3010 Interstitial cystitis (chronic) without hematuria: Secondary | ICD-10-CM

## 2011-07-05 MED ORDER — PHENAZOPYRIDINE HCL 100 MG PO TABS: 100.0000 mg | ORAL_TABLET | Freq: Three times a day (TID) | ORAL | Status: DC | PRN

## 2011-07-05 NOTE — Telephone Encounter (Signed)
Patient request refill of her pyridium for interstitial cystitis.

## 2011-07-27 ENCOUNTER — Other Ambulatory Visit (INDEPENDENT_AMBULATORY_CARE_PROVIDER_SITE_OTHER): Payer: Medicare Other | Admitting: *Deleted

## 2011-07-27 DIAGNOSIS — N390 Urinary tract infection, site not specified: Secondary | ICD-10-CM

## 2011-07-27 LAB — POCT URINALYSIS DIPSTICK
Blood, UA: 2
Nitrite, UA: POSITIVE
Protein, UA: POSITIVE

## 2011-07-27 MED ORDER — NITROFURANTOIN MONOHYD MACRO 100 MG PO CAPS: 100.0000 mg | ORAL_CAPSULE | Freq: Two times a day (BID) | ORAL | Status: AC

## 2011-07-28 NOTE — Progress Notes (Signed)
Yes Dr. Shawnie Pons, I called in [facility] for her and sent it for culture.  She is allergic to several antibiotics.  I did BID for a week.

## 2011-07-30 LAB — URINE CULTURE

## 2011-08-03 ENCOUNTER — Telehealth: Payer: Self-pay | Admitting: *Deleted

## 2011-08-03 DIAGNOSIS — N390 Urinary tract infection, site not specified: Secondary | ICD-10-CM

## 2011-08-03 MED ORDER — CEPHALEXIN 500 MG PO CAPS: 500.0000 mg | ORAL_CAPSULE | Freq: Three times a day (TID) | ORAL | Status: AC

## 2011-08-03 NOTE — Telephone Encounter (Signed)
Patient came by to let us know she was still symptomatic from her UTI.  I checked with Dr. Shawnie Pons and she reccomends changing her to Keflex 500 tid as it is more sensitive to this med than to macrobid.

## 2011-08-09 ENCOUNTER — Encounter: Payer: Self-pay | Admitting: Family Medicine

## 2011-08-09 ENCOUNTER — Ambulatory Visit (INDEPENDENT_AMBULATORY_CARE_PROVIDER_SITE_OTHER): Payer: Medicare Other | Admitting: Family Medicine

## 2011-08-09 VITALS — BP 120/70 | HR 68 | Temp 98.0°F | Wt 155.0 lb

## 2011-08-09 DIAGNOSIS — IMO0001 Reserved for inherently not codable concepts without codable children: Secondary | ICD-10-CM

## 2011-08-09 DIAGNOSIS — E039 Hypothyroidism, unspecified: Secondary | ICD-10-CM

## 2011-08-09 MED ORDER — CYCLOBENZAPRINE HCL 5 MG PO TABS: ORAL_TABLET | ORAL | Status: DC

## 2011-08-09 NOTE — Progress Notes (Signed)
71 yo female nhere for med refill and to discuss several issues.  1. Fibromyalgia- neuropathic pain in arms, legs getting worse. Currently on Prozaac, does not help much. Tried Lyrica in past, made her too sleepy.  Tried Savella in past, cannot remember why she did not like it. Seeing rheumatologist, Dr. Gavin Potters.  2. Hypothyroidism- currently taking Levothyroxine 75 micrograms daily. Has not had TSH checked in a while. Wants to make sure her thyroid is not the source of her symptoms. Lab Results  Component Value Date   TSH 1.87 08/12/2009    Patient Active Problem List  Diagnoses  . MALIGNANT NEOPLASM OF COLON UNSPECIFIED SITE  . COLONIC POLYPS  . HYPOTHYROIDISM NOS  . UNSPECIFIED VITAMIN D DEFICIENCY  . HYPERCHOLESTEROLEMIA, PURE  . HYPERLIPIDEMIA  . OTITIS EXTERNA  . HEARING LOSS  . ESOPHAGITIS, MILD  . ESOPHAGEAL STRICTURE  . GERD  . GASTROPARESIS  . DIVERTICULOSIS-COLON  . DIVERTICULITIS, COLON  . UTI  . BREAST MASS, RIGHT  . ARTHRITIS  . BACK PAIN, LUMBAR  . FIBROMYALGIA  . OSTEOPOROSIS  . COUGH  . DYSPHAGIA UNSPECIFIED  . DYSPHAGIA  . DYSURIA, CHRONIC  . MAMMOGRAM, ABNORMAL, LEFT  . PERSONAL HX COLONIC POLYPS  . Interstitial cystitis   Past Medical History  Diagnosis Date  . Arthritis   . Esophageal reflux   . Myalgia and myositis, unspecified   . Pure hypercholesterolemia   . Unspecified hypothyroidism   . Benign neoplasm of colon   . Interstitial cystitis    Past Surgical History  Procedure Date  . Hysteroscopy w/d&c     Dove  . Appendectomy   . Inner ear surgery   . Breast lumpectomy   . Tubal ligation   . Eye surgery     cateracts   History  Substance Use Topics  . Smoking status: Never Smoker   . Smokeless tobacco: Not on file  . Alcohol Use: Not on file   No family history on file. Allergies  Allergen Reactions  . Atorvastatin   . Celecoxib     REACTION: Itch  . Ciprofloxacin   . Ezetimibe     REACTION: Aches  . Metronidazole     . Pregabalin     REACTION: Numb  . Simvastatin     REACTION: Aches  . Sulfonamide Derivatives    Current Outpatient Prescriptions on File Prior to Visit  Medication Sig Dispense Refill  . cephALEXin (KEFLEX) 500 MG capsule Take 1 capsule (500 mg total) by mouth 3 (three) times daily.  21 capsule  0  . FLUoxetine (PROZAC) 20 MG tablet Take 20 mg by mouth every other day.        . lansoprazole (PREVACID) 15 MG capsule Take 15 mg by mouth as needed.        . pentosan polysulfate (ELMIRON) 100 MG capsule 2 capsules by mouth twice daily  120 capsule  5  . phenazopyridine (PYRIDIUM) 100 MG tablet Take 1 tablet (100 mg total) by mouth 3 (three) times daily as needed.  90 tablet  11  . SYNTHROID 75 MCG tablet Take 1 tablet (75 mcg total) by mouth daily.  90 tablet  3  . FLUoxetine (PROZAC) 20 MG capsule TAKE 1 CAPSULE BY MOUTH    EVERY OTHER DAY AS NEEDED  FOR FIBROMYALGIA  45 capsule  1  . Niacinamide-Zn-Cu-Methylfolate (NICOMIDE) 750-25-1.5-0.5 MG TABS Take 1 tablet by mouth daily.        The PMH, PSH, Social History, Family History, Medications,  and allergies have been reviewed in [facility], and have been updated if relevant.   Review of Systems  See HPI  General: Complains of malaise; denies chills and fever.  Eyes: Denies blurring.  ENT: Denies difficulty swallowing.  CV: Denies shortness of breath with exertion.  GI: Denies abdominal pain.  MS: Complains of muscle aches; denies joint pain, joint redness, and joint swelling.  Psych: Denies anxiety and depression.  Physical Exam  BP 120/70  Pulse 68  Temp(Src) 98 F (36.7 C) (Oral)  Wt 155 lb (70.308 kg)  General: alert, well-developed, well-nourished, and well-hydrated. NAD  Mouth: MMM  Neck: No deformities, masses, or tenderness noted.  Psych: Cognition and judgment appear intact. Alert and cooperative with normal attention span and concentration. No apparent delusions, illusions, hallucinations   Assessment and Plan: 1.  FIBROMYALGIA  >15 min spent with face to face with patient, >50% counseling and/or coordinating care.  Advised discussing Savella again with Dr. Gavin Potters, maybe try it again. The patient indicates understanding of these issues and agrees with the plan.      2. HYPOTHYROIDISM NOS  Check labs today. TSH, T4, Free

## 2011-08-09 NOTE — Patient Instructions (Signed)
You may want to think about or ask Dr. Gavin Potters about Amanda Kane (you did try it a couple of years ago).

## 2011-09-11 ENCOUNTER — Encounter: Payer: Self-pay | Admitting: Family Medicine

## 2011-09-11 ENCOUNTER — Ambulatory Visit (INDEPENDENT_AMBULATORY_CARE_PROVIDER_SITE_OTHER): Payer: Medicare Other | Admitting: Family Medicine

## 2011-09-11 VITALS — BP 150/90 | HR 80 | Temp 98.2°F | Resp 16 | Wt 155.0 lb

## 2011-09-11 DIAGNOSIS — R300 Dysuria: Secondary | ICD-10-CM

## 2011-09-11 LAB — POCT URINALYSIS DIPSTICK
Bilirubin, UA: NEGATIVE
Ketones, UA: NEGATIVE
Leukocytes, UA: NEGATIVE
Protein, UA: NEGATIVE
Spec Grav, UA: <=1.005
pH, UA: 6.5

## 2011-09-11 MED ORDER — CEPHALEXIN 500 MG PO CAPS: 500.0000 mg | ORAL_CAPSULE | Freq: Three times a day (TID) | ORAL | Status: AC

## 2011-09-11 NOTE — Progress Notes (Signed)
  Subjective:    Patient ID: Amanda Kane, female    DOB: May 01, 1941, 71 y.o.   MRN: [id number]  HPI  Acute visit. Patient presents with three-day history of dysuria and some bilateral flank pain. Occasional chills but no fever. Denies any nausea or vomiting. No abdominal pain. Some urine frequency. She's had a couple of recent UTIs with Escherichia coli back in September and again in March. Each time symptoms cleared with antibiotics. She has reported allergy to sulfa and Cipro. Has tolerated Keflex without difficulty in the past.  Past Medical History  Diagnosis Date  . Arthritis   . Esophageal reflux   . Myalgia and myositis, unspecified   . Pure hypercholesterolemia   . Unspecified hypothyroidism   . Benign neoplasm of colon   . Interstitial cystitis    Past Surgical History  Procedure Date  . Hysteroscopy w/d&c     Dove  . Appendectomy   . Inner ear surgery   . Breast lumpectomy   . Tubal ligation   . Eye surgery     cateracts    reports that she has never smoked. She does not have any smokeless tobacco history on file. Her alcohol and drug histories not on file. family history is not on file. Allergies  Allergen Reactions  . Atorvastatin   . Celecoxib     REACTION: Itch  . Ciprofloxacin   . Ezetimibe     REACTION: Aches  . Metronidazole   . Pregabalin     REACTION: Numb  . Simvastatin     REACTION: Aches  . Sulfonamide Derivatives      Review of Systems  Constitutional: Negative for fever, chills and appetite change.  Gastrointestinal: Negative for nausea, vomiting, abdominal pain, diarrhea and constipation.  Genitourinary: Positive for dysuria and frequency. Negative for hematuria.  Musculoskeletal: Negative for back pain.  Neurological: Negative for dizziness.       Objective:   Physical Exam  Constitutional: She appears well-developed and well-nourished.  Cardiovascular: Normal rate and regular rhythm.   Pulmonary/Chest: Effort normal and breath  sounds normal. No respiratory distress. She has no wheezes. She has no rales.  Musculoskeletal:       Minimal bilateral lower lumbar tenderness          Assessment & Plan:  Dysuria. Rule out UTI. Positive nitrite and negative leukocytes. Urine culture. Start Keflex 500 mg 3 times a day for 7 days pending culture results.

## 2011-09-11 NOTE — Patient Instructions (Signed)

## 2011-09-13 ENCOUNTER — Telehealth: Payer: Self-pay | Admitting: Family Medicine

## 2011-09-13 NOTE — Telephone Encounter (Signed)
Triage Record Num: [id number] Operator: Audelia Hives Patient Name: Amanda Kane Call Date & Time: 09/11/2011 9:15:03AM Patient Phone: [phone] PCP: Ruthe Mannan Patient Gender: Female PCP Fax : [phone] Patient DOB: Jun 07, 1940 Practice Name: [facility] Reason for Call: Caller: [name]/Patient; PCP: Ruthe Mannan Nestor Ramp); CB#: [phone]; Call regarding Urinary Pain/Bleeding; Pt calling regarding UTI s/s, back pain, flank pain, that started 09/08/11. Afebrile. HX of kidney infections. Wants abx called in if possible. Emergent s/s for Flank Pain r/o per protocol except for see in 4 hours due to flank pain and urinary tract s/s. Appt sch for 09/11/11 Dr. Caryl Never 11:30 am. Protocol(s) Used: Flank Pain Protocol(s) Used: Urinary Symptoms - Female Recommended Outcome per Protocol: See Provider within 4 hours Reason for Outcome: Flank pain Flank pain or low back pain AND urinary tract symptoms Care Advice: ~ Tell provider medical history of renal disease; especially if have only one kidney. Increase intake of fluids. Try to drink 8 oz. (.2 liter) every hour when awake, including unsweetened cranberry juice, unless on restricted fluids for other medical reasons. Take sips of fluid or eat ice chips if nauseated or vomiting. ~ ~ SYMPTOM / CONDITION MANAGEMENT Systemic Inflammatory Response Syndrome (SIRS): Watch for signs of a generalized, whole body infection. Occurs within days of a localized infection, especially of the urinary, GI, respiratory or nervous systems; or after a traumatic injury or invasive procedure. - Call EMS 911 if symptoms have worsened, such as increasing confusion or unusual drowsiness; cold and clammy skin; no urine output; rapid respiration (>30/min.) or slow respiration (<10/min.); struggling to breathe. - Go to the ED immediately for early symptoms of rapid pulse >90/min. or rapid breathing >20/min. at rest; chills; oral temperature >100.4 F (38 C) or  <96.8 F (36 C) when associated with conditions noted. ~ 09/11/2011 9:31:03AM Page 1 of 1 [id number]

## 2011-09-13 NOTE — Progress Notes (Signed)
Quick Note:  Pt informed on VM ______ 

## 2011-09-23 ENCOUNTER — Ambulatory Visit (INDEPENDENT_AMBULATORY_CARE_PROVIDER_SITE_OTHER): Payer: Medicare Other | Admitting: Family Medicine

## 2011-09-23 ENCOUNTER — Telehealth: Payer: Self-pay

## 2011-09-23 ENCOUNTER — Encounter: Payer: Self-pay | Admitting: Family Medicine

## 2011-09-23 VITALS — BP 142/80 | HR 96 | Temp 98.0°F | Wt 156.0 lb

## 2011-09-23 DIAGNOSIS — N390 Urinary tract infection, site not specified: Secondary | ICD-10-CM

## 2011-09-23 DIAGNOSIS — R350 Frequency of micturition: Secondary | ICD-10-CM

## 2011-09-23 LAB — POCT URINALYSIS DIPSTICK
Bilirubin, UA: NEGATIVE
Blood, UA: NEGATIVE
Ketones, UA: NEGATIVE
Leukocytes, UA: NEGATIVE
pH, UA: 5

## 2011-09-23 MED ORDER — AMITRIPTYLINE HCL 10 MG PO TABS: 10.0000 mg | ORAL_TABLET | Freq: Every day | ORAL | Status: DC

## 2011-09-23 NOTE — Telephone Encounter (Signed)
Pt left v/m requesting urine culture results from Dr Sander Radon. Pt left contact # [phone], spoke with gentleman who will have pt call back.

## 2011-09-23 NOTE — Telephone Encounter (Signed)
Pt seen on 09/11/11 sat clinic for UTI. Pt took antibiotic for 5 or 7 days. Pt said symptoms never went away, pt having burning upon urination, frequency, discomfort in bladder area of lower abdomen.No back  pain and no N&V. Pt to see Dr Dayton Martes today at 12 noon per Jacki Cones.

## 2011-09-23 NOTE — Progress Notes (Signed)
  Subjective:    Patient ID: Amanda Kane, female    DOB: 12-24-1940, 71 y.o.   MRN: [id number]  HPI 71 yo female well known to me with h/o IC here for ? UTI.  Had similar symptoms last month.  Saw Dr. Caryl Never in weekend clinic.  UA pos for nitrites, started on Keflex but urine cx was neg.  Symptoms did improve with Keflex. Did have positive cultures for E.coli in September and March.   Each time symptoms cleared with antibiotics. She has reported allergy to sulfa and Cipro.   Today Amanda Kane presents with three-day history of dysuria and some bilateral flank pain. Occasional chills but no fever. Denies any nausea or vomiting. No abdominal pain. Some urine frequency.   Past Medical History  Diagnosis Date  . Arthritis   . Esophageal reflux   . Myalgia and myositis, unspecified   . Pure hypercholesterolemia   . Unspecified hypothyroidism   . Benign neoplasm of colon   . Interstitial cystitis    Past Surgical History  Procedure Date  . Hysteroscopy w/d&c     Dove  . Appendectomy   . Inner ear surgery   . Breast lumpectomy   . Tubal ligation   . Eye surgery     cateracts    reports that she has never smoked. She does not have any smokeless tobacco history on file. Her alcohol and drug histories not on file. family history is not on file. Allergies  Allergen Reactions  . Atorvastatin   . Celecoxib     REACTION: Itch  . Ciprofloxacin   . Ezetimibe     REACTION: Aches  . Metronidazole   . Pregabalin     REACTION: Numb  . Simvastatin     REACTION: Aches  . Sulfonamide Derivatives      Review of Systems  Constitutional: Negative for fever, chills and appetite change.  Gastrointestinal: Negative for nausea, vomiting, abdominal pain, diarrhea and constipation.  Genitourinary: Positive for dysuria and frequency. Negative for hematuria.  Musculoskeletal: Negative for back pain.  Neurological: Negative for dizziness.       Objective:   Physical Exam  BP 142/80   Pulse 96  Temp(Src) 98 F (36.7 C) (Oral)  Wt 156 lb (70.761 kg)  Constitutional: She appears well-developed and well-nourished.  Cardiovascular: Normal rate and regular rhythm.   Pulmonary/Chest: Effort normal and breath sounds normal. No respiratory distress. She has no wheezes. She has no rales.  Abd:  Mild suprapubic tenderness, NO CVA tenderness     Assessment & Plan:  1.  Dysuria-  UA neg. Likely secondary to IC. Pt is already taking Elmiron although she can increase dose. Discussed treatment options. We agreed to add low dose elavil to her elmiron. She will call me next week with an update. The patient indicates understanding of these issues and agrees with the plan.

## 2011-09-23 NOTE — Progress Notes (Signed)
Addended by: Eliezer Bottom on: 09/23/2011 12:39 PM   Modules accepted: Orders

## 2011-09-23 NOTE — Patient Instructions (Signed)
Good to see you. These symptoms are likely related to your interstitial cystitis. We are adding amitriptyline 10 mg at bedtime. Please call me next week with an update.

## 2011-10-06 ENCOUNTER — Telehealth: Payer: Self-pay | Admitting: Family Medicine

## 2011-10-06 NOTE — Telephone Encounter (Signed)
Amanda Kane with [facility] called earlier;pt has chronic UTI, today lower back pain, flank pain both sides, frequency of urine with dysuria,ha/ fatigue, bruising on arms with no known injury. No fever, no abdominal pain."pt feels over drugged". Taking Elmiron and feels worse since doubled dosage. Dr Patsy Lager said OK to schedule pt 10/07/11. Pt scheduled to see Dr Dayton Martes 10/07/11 at 9:45 am. Smitty Cords if pt condition changes or worsens to go to ER or UC. Amanda Kane said she would notifiy pt.

## 2011-10-06 NOTE — Telephone Encounter (Signed)
Caller: Gabrianna/Patient; PCP: Gwinda Passe); CB#: [phone];  Call regarding ongoing Headache/Fatigue/mid Backache/Joint pain ( which she defined as flank pain).  Onset: mid April 2013. Afebrile. Seen in office 09/22/11.  Urine culture negative.  Backache, headache and joint pain worsened after doubled up on Elmiron. Feels "overdrugged." Asking if there is something else she can take other than Elmiron since internet list of side effects include the same symptoms.  Flank pain present with bruising on arms and fatigue.  Urinary frequency and dysuria continues with worsening flank pain.  Advised to see MD within 4 hrs for flank pain with UTI symptoms per Flank Pain Guideline. Per Epic, no appts remain for 10/06/11.  Contacted office triage nurse, Rena who spoke with Dr Patsy Lager.  Ordered to see in office 10/07/11; Rena scheduled appt for 0945 with Dr Dayton Martes. If symptoms worsen or change, may be seen at [facility] or ED.

## 2011-10-07 ENCOUNTER — Encounter: Payer: Self-pay | Admitting: Family Medicine

## 2011-10-07 ENCOUNTER — Ambulatory Visit (INDEPENDENT_AMBULATORY_CARE_PROVIDER_SITE_OTHER): Payer: Medicare Other | Admitting: Family Medicine

## 2011-10-07 VITALS — BP 150/82 | HR 80 | Temp 98.1°F | Wt 156.0 lb

## 2011-10-07 DIAGNOSIS — N3010 Interstitial cystitis (chronic) without hematuria: Secondary | ICD-10-CM

## 2011-10-07 DIAGNOSIS — R300 Dysuria: Secondary | ICD-10-CM

## 2011-10-07 LAB — POCT URINALYSIS DIPSTICK
Bilirubin, UA: NEGATIVE
Blood, UA: NEGATIVE
Glucose, UA: NEGATIVE
Nitrite, UA: NEGATIVE
Spec Grav, UA: <=1.005

## 2011-10-07 NOTE — Patient Instructions (Signed)
Your urine was clear today. Please keep your appointment with urology. Call Dr. Arlyce Dice to schedule a follow up. Keep me posted with your symptoms.

## 2011-10-07 NOTE — Progress Notes (Signed)
  Subjective:    Patient ID: Amanda Kane, female    DOB: [birth year], 71 y.o.   MRN: [id number]  HPI 71 yo female well known to me with h/o IC here for multiple complaints.  Saw her on 5/9 for symptoms of dysuria and increased frequency.  UA was neg- felt this was likely due to her IC. Has been taking Elmiron for IC but had been skipping doses.  Added low dose elavil to her elmiron.  Today she presents with several symptoms: Epigastric pain, flank pain, "feels like glass coming out when I urinate."  No fevers, chills, nausea or vomiting.  Stopped taking the Elmiron yesterday and already feels a little better.  Continues taking the Elavil.  Has follow up scheduled with urologist in two weeks.  Of note, has h/o esophageal stricture requiring dilation.    Past Medical History  Diagnosis Date  . Arthritis   . Esophageal reflux   . Myalgia and myositis, unspecified   . Pure hypercholesterolemia   . Unspecified hypothyroidism   . Benign neoplasm of colon   . Interstitial cystitis    Past Surgical History  Procedure Date  . Hysteroscopy w/d&c     Dove  . Appendectomy   . Inner ear surgery   . Breast lumpectomy   . Tubal ligation   . Eye surgery     cateracts    reports that she has never smoked. She does not have any smokeless tobacco history on file. Her alcohol and drug histories not on file. family history is not on file. Allergies  Allergen Reactions  . Atorvastatin   . Celecoxib     REACTION: Itch  . Ciprofloxacin   . Ezetimibe     REACTION: Aches  . Metronidazole   . Pregabalin     REACTION: Numb  . Simvastatin     REACTION: Aches  . Sulfonamide Derivatives      Review of Systems  Constitutional: Negative for fever, chills and appetite change.  Gastrointestinal: Negative for nausea, vomiting, abdominal pain, diarrhea and constipation.  Genitourinary: Positive for dysuria and frequency. Negative for hematuria.  Musculoskeletal: Negative for back pain.    Neurological: Negative for dizziness.       Objective:   Physical Exam  Wt 156 lb (70.761 kg)  Constitutional: She appears well-developed and well-nourished.  Cardiovascular: Normal rate and regular rhythm.   Pulmonary/Chest: Effort normal and breath sounds normal. No respiratory distress. She has no wheezes. She has no rales.  Abd:  Mild suprapubic tenderness, NO CVA tenderness     Assessment & Plan:  1.  Dysuria-  UA neg again today. Likely secondary to IC. D/c elmiron, continue elavil. Keep appointment with urology. Also advised making follow up appt with GI for possible endoscopy. She will call me next week with an update. The patient indicates understanding of these issues and agrees with the plan.

## 2011-10-14 ENCOUNTER — Telehealth: Payer: Self-pay | Admitting: Family Medicine

## 2011-10-14 NOTE — Telephone Encounter (Signed)
Caller: Rayme/Patient; PCP: Gwinda Passe); CB#: [phone]; Call regarding Urinary Pain. Urinary and flank pain and frequency onset this AM. Home UA showed positive leukocytes. Pt declines an appt as she was just seen on 10/07/11. ? if she can come in for a UA only. Pls call.

## 2011-10-15 ENCOUNTER — Other Ambulatory Visit: Payer: Self-pay | Admitting: *Deleted

## 2011-10-15 MED ORDER — AMITRIPTYLINE HCL 10 MG PO TABS: 10.0000 mg | ORAL_TABLET | Freq: Every day | ORAL | Status: DC

## 2011-10-15 NOTE — Telephone Encounter (Signed)
Faxed refill request from [facility], last filled 30 on 09/23/10.

## 2011-10-18 ENCOUNTER — Encounter: Payer: Self-pay | Admitting: Family Medicine

## 2011-10-18 ENCOUNTER — Ambulatory Visit (INDEPENDENT_AMBULATORY_CARE_PROVIDER_SITE_OTHER): Payer: Medicare Other | Admitting: Family Medicine

## 2011-10-18 VITALS — BP 150/88 | HR 92 | Temp 100.1°F | Resp 20 | Ht 62.0 in | Wt 154.5 lb

## 2011-10-18 DIAGNOSIS — J069 Acute upper respiratory infection, unspecified: Secondary | ICD-10-CM | POA: Insufficient documentation

## 2011-10-18 DIAGNOSIS — N3010 Interstitial cystitis (chronic) without hematuria: Secondary | ICD-10-CM

## 2011-10-18 DIAGNOSIS — N390 Urinary tract infection, site not specified: Secondary | ICD-10-CM

## 2011-10-18 LAB — POCT URINALYSIS DIPSTICK
Glucose, UA: NEGATIVE
Nitrite, UA: POSITIVE
Spec Grav, UA: 1.01
Urobilinogen, UA: 0.2

## 2011-10-18 MED ORDER — NITROFURANTOIN MONOHYD MACRO 100 MG PO CAPS: 100.0000 mg | ORAL_CAPSULE | Freq: Two times a day (BID) | ORAL | Status: AC

## 2011-10-18 MED ORDER — ALBUTEROL SULFATE HFA 108 (90 BASE) MCG/ACT IN AERS: 2.0000 | INHALATION_SPRAY | Freq: Four times a day (QID) | RESPIRATORY_TRACT | Status: DC | PRN

## 2011-10-18 MED ORDER — CEFTRIAXONE SODIUM 1 G IJ SOLR
1.0000 g | Freq: Once | INTRAMUSCULAR | Status: AC
  Administered 2011-10-18: 1 g via INTRAMUSCULAR

## 2011-10-18 NOTE — Patient Instructions (Signed)
Great to see you. You can start the macrobid today if you would like. Use your inhaler as directed- I have sent in a prescription as well.

## 2011-10-18 NOTE — Progress Notes (Addendum)
Subjective:    Patient ID: Amanda Kane, female    DOB: [birth year], 71 y.o.   MRN: [id number]  HPI 71 yo female well known to me with h/o IC here for multiple complaints.  Saw her on 5/9 for symptoms of dysuria and increased frequency.  UA was neg- felt this was likely due to her IC. Has been taking Elmiron for IC but had been skipping doses.  Added low dose elavil to her elmiron.  Saw her on 5/23 with several symptoms: Epigastric pain, flank pain, "feels like glass coming out when I urinate."  No fevers, chills, nausea or vomiting. She felt this was due to Elmiron and stopped taking it, symptoms mostly improved.   Continues taking the Elavil. UA was neg.  Today- past 4 days- increased dysuria and visible hematuria. Had some back pain that has resolved.  No fevers. Also has a terrible cough and congestion.  Congestion improved but still has barky cough. No SOB or CP.  Has follow up scheduled with urologist next week.  Of note, has h/o esophageal stricture requiring dilation.  BP elevated again today but she has been coughing and is in discomfort due to bladder spasms. BP Readings from Last 3 Encounters:  10/18/11 150/88  10/07/11 150/82  09/23/11 142/80      Past Medical History  Diagnosis Date  . Arthritis   . Esophageal reflux   . Myalgia and myositis, unspecified   . Pure hypercholesterolemia   . Unspecified hypothyroidism   . Benign neoplasm of colon   . Interstitial cystitis    Past Surgical History  Procedure Date  . Hysteroscopy w/d&c     Dove  . Appendectomy   . Inner ear surgery   . Breast lumpectomy   . Tubal ligation   . Eye surgery     cateracts    reports that she has never smoked. She does not have any smokeless tobacco history on file. Her alcohol and drug histories not on file. family history is not on file. Allergies  Allergen Reactions  . Atorvastatin   . Celecoxib     REACTION: Itch  . Ciprofloxacin   . Ezetimibe     REACTION: Aches    . Metronidazole   . Pregabalin     REACTION: Numb  . Simvastatin     REACTION: Aches  . Sulfonamide Derivatives      Review of Systems  Constitutional: Negative for fever, chills and appetite change.  Gastrointestinal: Negative for nausea, vomiting, abdominal pain, diarrhea and constipation.  Genitourinary: Positive for dysuria and frequency. Negative for hematuria.  Musculoskeletal: Negative for back pain.  Neurological: Negative for dizziness.       Objective:   Physical Exam  BP 150/88  Pulse 92  Temp(Src) 100.1 F (37.8 C) (Oral)  Resp 20  Ht 5\' 2"  (1.575 m)  Wt 154 lb 8 oz (70.081 kg)  BMI 28.26 kg/m2 BP Readings from Last 3 Encounters:  10/18/11 150/88  10/07/11 150/82  09/23/11 142/80    Constitutional: She appears well-developed and well-nourished.  Cardiovascular: Normal rate and regular rhythm.   Pulmonary/Chest: Effort normal and breath sounds normal. No respiratory distress. Faint exp wheezes, no crackles Abd:  Mild suprapubic tenderness, NO CVA tenderness     Assessment & Plan:    1. UTI  UA positive for UTI. Multiple drug allergies- IM rochephin in office today, macrobid 100 mg twice daily x 7 days. Send urine for cx and keep appt with urology next week.  POCT UA - Microscopic Only, POCT Urinalysis Dipstick  2. Interstitial cystitis Deteriorated, likely due to #1.    3. URI (upper respiratory infection)  New- likely viral. Continue supportive care. Given rx for albuterol. Red flags symptoms given requiring follow up.

## 2011-10-18 NOTE — Progress Notes (Signed)
Addended by: Alvina Chou on: 10/18/2011 11:25 AM   Modules accepted: Orders

## 2011-10-18 NOTE — Progress Notes (Signed)
Addended by: Patience Musca on: 10/18/2011 01:25 PM   Modules accepted: Orders

## 2011-10-20 LAB — URINE CULTURE: Colony Count: >=100000

## 2011-10-22 ENCOUNTER — Ambulatory Visit (INDEPENDENT_AMBULATORY_CARE_PROVIDER_SITE_OTHER): Payer: Medicare Other | Admitting: Family Medicine

## 2011-10-22 ENCOUNTER — Encounter: Payer: Self-pay | Admitting: Family Medicine

## 2011-10-22 VITALS — BP 120/92 | HR 98 | Temp 98.0°F | Wt 150.0 lb

## 2011-10-22 DIAGNOSIS — J069 Acute upper respiratory infection, unspecified: Secondary | ICD-10-CM

## 2011-10-22 DIAGNOSIS — N390 Urinary tract infection, site not specified: Secondary | ICD-10-CM

## 2011-10-22 MED ORDER — DEXAMETHASONE SOD PHOSPHATE PF 10 MG/ML IJ SOLN
10.0000 mg | Freq: Once | INTRAMUSCULAR | Status: AC
  Administered 2011-10-22: 10 mg via INTRAMUSCULAR

## 2011-10-22 NOTE — Progress Notes (Signed)
  Subjective:    Patient ID: Amanda Kane, female    DOB: [birth year], 71 y.o.   MRN: [id number]  HPI 71 yo female well known to me with h/o IC here for follow up.  Saw her on 5/9 for symptoms of dysuria and increased frequency.  UA was neg- felt this was likely due to her IC. Has been taking Elmiron for IC but had been skipping doses.  Added low dose elavil to her elmiron.  Saw her on 5/23 with several symptoms: Epigastric pain, flank pain, "feels like glass coming out when I urinate."  No fevers, chills, nausea or vomiting. She felt this was due to Elmiron and stopped taking it, symptoms mostly improved.   Continues taking the Elavil. UA was neg.  On 6/3- increased dysuria and visible hematuria. Had some back pain that has resolved.  No fevers. Also has a terrible cough and congestion.  Congestion improved but still has barky cough. No SOB or CP.  Has follow up scheduled with urologist next week.  Given IM rocephin and macrobid 100 mg twice daily x 7 days.  Urine cx grew- >100,000 E.coli, sensitive to macrobid.  Today dysuria and frequency have completely resolved but still feels very "wheezy." Rescue inhaler not helping much.    Past Medical History  Diagnosis Date  . Arthritis   . Esophageal reflux   . Myalgia and myositis, unspecified   . Pure hypercholesterolemia   . Unspecified hypothyroidism   . Benign neoplasm of colon   . Interstitial cystitis    Past Surgical History  Procedure Date  . Hysteroscopy w/d&c     Dove  . Appendectomy   . Inner ear surgery   . Breast lumpectomy   . Tubal ligation   . Eye surgery     cateracts    reports that she has never smoked. She does not have any smokeless tobacco history on file. Her alcohol and drug histories not on file. family history is not on file. Allergies  Allergen Reactions  . Atorvastatin   . Celecoxib     REACTION: Itch  . Ciprofloxacin   . Ezetimibe     REACTION: Aches  . Metronidazole   . Pregabalin      REACTION: Numb  . Simvastatin     REACTION: Aches  . Sulfonamide Derivatives      Review of Systems  Constitutional: Negative for fever, chills and appetite change.  Gastrointestinal: Negative for nausea, vomiting, abdominal pain, diarrhea and constipation.       Objective:   Physical Exam  BP 120/92  Pulse 98  Temp 98 F (36.7 C)  Wt 150 lb (68.04 kg)  SpO2 94%  Constitutional: She appears well-developed and well-nourished, no increased WOB.  Cardiovascular: Normal rate and regular rhythm.   Pulmonary/Chest: Effort normal and breath sounds normal. No respiratory distress. Diffuse exp wheezes, no crackles Abd: No suprapubic tenderness today, NO CVA tenderness     Assessment & Plan:    1. URI (upper respiratory infection)  Given IM decadron in office today to help with symptoms.  Pt to keep me updated with her symptoms and aware she needs to go to ER if symptoms worsen over the weekend. dexamethasone Sod Phosphate PF SOLN 10 mg  2. UTI  Improving on Macrobid. Keep urology appt for Monday.

## 2011-10-26 ENCOUNTER — Other Ambulatory Visit: Payer: Self-pay | Admitting: Family Medicine

## 2011-11-17 ENCOUNTER — Other Ambulatory Visit: Payer: Self-pay

## 2011-11-17 MED ORDER — LANSOPRAZOLE 15 MG PO CPDR: 15.0000 mg | DELAYED_RELEASE_CAPSULE | ORAL | Status: DC | PRN

## 2011-11-17 MED ORDER — AMITRIPTYLINE HCL 10 MG PO TABS: 10.0000 mg | ORAL_TABLET | Freq: Every day | ORAL | Status: DC

## 2011-11-17 NOTE — Telephone Encounter (Signed)
Pt request refill Amitriptyline and Prevacid to [facility].Please advise.

## 2011-11-29 ENCOUNTER — Telehealth: Payer: Self-pay | Admitting: Family Medicine

## 2011-11-29 DIAGNOSIS — R590 Localized enlarged lymph nodes: Secondary | ICD-10-CM | POA: Insufficient documentation

## 2011-11-29 NOTE — Telephone Encounter (Signed)
CT scan set up for October 11th,2013, patient aware of the appt.

## 2011-11-29 NOTE — Telephone Encounter (Signed)
Received a call from Dr. Sherron Monday at [facility] urology. He ordered a CT of abdomen and Pelvis with and without contrast which was negative but did show increased number and size of mesenteric lymph nodes.  Radiology recommended repeat CT in 3-6 months.  He would like for me to take of this. He will fax results to me.  I will place order for CT of abdomen and pelvis to occur in 3 months.  Per Dr. Sherron Monday, pt aware of results.

## 2011-12-27 ENCOUNTER — Other Ambulatory Visit: Payer: Self-pay | Admitting: Family Medicine

## 2012-01-13 ENCOUNTER — Telehealth: Payer: Self-pay

## 2012-01-13 DIAGNOSIS — M25569 Pain in unspecified knee: Secondary | ICD-10-CM

## 2012-01-13 NOTE — Telephone Encounter (Signed)
Pt having more problems with left knee;wants referral to Dr Lequita Halt for possible knee replacement.Please advise.

## 2012-01-14 NOTE — Telephone Encounter (Signed)
Referral placed.

## 2012-01-26 ENCOUNTER — Ambulatory Visit (INDEPENDENT_AMBULATORY_CARE_PROVIDER_SITE_OTHER): Payer: Medicare Other | Admitting: Family Medicine

## 2012-01-26 ENCOUNTER — Encounter: Payer: Self-pay | Admitting: Family Medicine

## 2012-01-26 VITALS — BP 160/90 | HR 68 | Temp 98.3°F | Wt 155.0 lb

## 2012-01-26 DIAGNOSIS — H109 Unspecified conjunctivitis: Secondary | ICD-10-CM | POA: Insufficient documentation

## 2012-01-26 MED ORDER — BEPOTASTINE BESILATE 1.5 % OP SOLN: OPHTHALMIC | Status: DC

## 2012-01-26 MED ORDER — SYNTHROID 75 MCG PO TABS: 75.0000 ug | ORAL_TABLET | Freq: Every day | ORAL | Status: DC

## 2012-01-26 NOTE — Patient Instructions (Addendum)
Great to see you. Please come back to see me in a few weeks to recheck your blood pressure.

## 2012-01-26 NOTE — Progress Notes (Signed)
SUBJECTIVE:  71 y.o. female with burning, redness, irritation right eye x 3 days.  No other symptoms.  No significant prior ophthalmological history. No change in visual acuity, no photophobia, no severe eye pain.  Patient Active Problem List  Diagnosis  . MALIGNANT NEOPLASM OF COLON UNSPECIFIED SITE  . COLONIC POLYPS  . HYPOTHYROIDISM NOS  . UNSPECIFIED VITAMIN D DEFICIENCY  . HYPERCHOLESTEROLEMIA, PURE  . HYPERLIPIDEMIA  . OTITIS EXTERNA  . HEARING LOSS  . ESOPHAGITIS, MILD  . ESOPHAGEAL STRICTURE  . GERD  . GASTROPARESIS  . DIVERTICULOSIS-COLON  . DIVERTICULITIS, COLON  . UTI  . BREAST MASS, RIGHT  . ARTHRITIS  . BACK PAIN, LUMBAR  . FIBROMYALGIA  . OSTEOPOROSIS  . COUGH  . DYSPHAGIA UNSPECIFIED  . DYSPHAGIA  . DYSURIA, CHRONIC  . MAMMOGRAM, ABNORMAL, LEFT  . PERSONAL HX COLONIC POLYPS  . Interstitial cystitis  . URI (upper respiratory infection)  . Mesenteric lymphadenopathy  . Conjunctivitis   Past Medical History  Diagnosis Date  . Arthritis   . Esophageal reflux   . Myalgia and myositis, unspecified   . Pure hypercholesterolemia   . Unspecified hypothyroidism   . Benign neoplasm of colon   . Interstitial cystitis    Past Surgical History  Procedure Date  . Hysteroscopy w/d&c     Dove  . Appendectomy   . Inner ear surgery   . Breast lumpectomy   . Tubal ligation   . Eye surgery     cateracts   History  Substance Use Topics  . Smoking status: Never Smoker   . Smokeless tobacco: Not on file  . Alcohol Use: Not on file   No family history on file. Allergies  Allergen Reactions  . Atorvastatin   . Celecoxib     REACTION: Itch  . Ciprofloxacin   . Ezetimibe     REACTION: Aches  . Metronidazole   . Pregabalin     REACTION: Numb  . Simvastatin     REACTION: Aches  . Sulfonamide Derivatives    Current Outpatient Prescriptions on File Prior to Visit  Medication Sig Dispense Refill  . albuterol (PROVENTIL HFA;VENTOLIN HFA) 108 (90 BASE)  MCG/ACT inhaler Inhale 2 puffs into the lungs every 6 (six) hours as needed for wheezing.  1 Inhaler  0  . amitriptyline (ELAVIL) 10 MG tablet TAKE 1 TABLET (10 MG TOTAL) BY MOUTH AT BEDTIME.  30 tablet  0  . cyclobenzaprine (FLEXERIL) 5 MG tablet Take one by mouth at bedtime as needed. Take one by mouth at bedtime as needed.  30 tablet  3  . gabapentin (NEURONTIN) 300 MG capsule Take one by mouth at bedtime as needed      . HYDROcodone-acetaminophen (NORCO) 5-325 MG per tablet Take 1 tablet by mouth every 4 (four) hours as needed.      . lansoprazole (PREVACID) 15 MG capsule Take 1 capsule (15 mg total) by mouth as needed.  30 capsule  11  . phenazopyridine (PYRIDIUM) 100 MG tablet Take 1 tablet (100 mg total) by mouth 3 (three) times daily as needed.  90 tablet  11  . SYNTHROID 75 MCG tablet TAKE 1 TABLET DAILY  90 tablet  3   The PMH, PSH, Social History, Family History, Medications, and allergies have been reviewed in [facility], and have been updated if relevant.  OBJECTIVE:  BP 160/90  Pulse 68  Temp 98.3 F (36.8 C)  Wt 155 lb (70.308 kg) BP Readings from Last 3 Encounters:  01/26/12  160/90  10/22/11 120/92  10/18/11 150/88     Patient appears well, vitals signs are normal. Eyes: right eye with mild conjunctival injection, no discharge. PERRLA, no foreign body noted. No periorbital cellulitis. The corneas are clear and fundi normal. Visual acuity normal.   ASSESSMENT:  Conjunctivitis - probably allergic  PLAN:  Bepreve drops per orders.  Hygiene discussed. If other family members develop same condition, may use same medication for them if they are not known to be allergic to it. Call prn.

## 2012-01-31 ENCOUNTER — Telehealth: Payer: Self-pay | Admitting: Family Medicine

## 2012-01-31 ENCOUNTER — Ambulatory Visit (INDEPENDENT_AMBULATORY_CARE_PROVIDER_SITE_OTHER): Payer: Medicare Other | Admitting: Obstetrics and Gynecology

## 2012-01-31 VITALS — BP 178/92 | HR 76 | Ht 62.0 in | Wt 153.0 lb

## 2012-01-31 DIAGNOSIS — N760 Acute vaginitis: Secondary | ICD-10-CM

## 2012-01-31 NOTE — Telephone Encounter (Signed)
Pt has chest tightness and tightness goes into right side of neck for several days. Pts BP up per [facility] note. No chest pain, SOB or dizziness.Dr Dayton Martes advised pt needs to be eval at ER now. Pt said she will wait and see how she does, she does not want to have to wait in ER for hours.Pt is not alone at home. Advised pt to call 911 if needed.

## 2012-01-31 NOTE — Progress Notes (Signed)
Patient ID: Amanda Kane, female   DOB: [birth year], 71 y.o.   MRN: [id number] Patient presents today for evaluation of vaginitis. Patient reports h/o recurrent UTI and was recently treated with Nitrofurantoin. Patient reports since then recurrent yeast infections which she has been treated with Monistat (her last treatment being 3-4 days ago). Patient states that despite her treatment, her vaginal pruritis persists and her vagina feels "raw". She denies any discharge.  SSE: Atrophic vaginal mucosa, normal cervix, thin white discharge  A/P 70 yo with vaginitis - wet prep collected - patient advised to continue using monistat cream on skin. Patient advised not to put anything in vagina until further notice. - Patient will be contacted with any abnormal results

## 2012-01-31 NOTE — Telephone Encounter (Signed)
Patient calling, has chest tightness and her b/p is elevated.  Was seen at Dr. Ellin Saba office earlier today and her b/p was 178/92 left arm and 184-93 in right arm.   States that she also has a headache today.    Advised 911 , patient refuses.  She refuses to have someone take her to the hospital.    She did not share with Dr. Norman Clay staff that she had and has had the chest tightness.  Triaged per Chest Pain protocal.

## 2012-02-01 LAB — WET PREP, GENITAL
Clue Cells Wet Prep HPF POC: NONE SEEN
Trich, Wet Prep: NONE SEEN
WBC, Wet Prep HPF POC: NONE SEEN

## 2012-02-03 ENCOUNTER — Ambulatory Visit (INDEPENDENT_AMBULATORY_CARE_PROVIDER_SITE_OTHER): Payer: Medicare Other | Admitting: Family Medicine

## 2012-02-03 ENCOUNTER — Encounter: Payer: Self-pay | Admitting: Family Medicine

## 2012-02-03 VITALS — BP 172/98 | HR 76 | Temp 98.0°F | Wt 154.0 lb

## 2012-02-03 DIAGNOSIS — E039 Hypothyroidism, unspecified: Secondary | ICD-10-CM

## 2012-02-03 DIAGNOSIS — I10 Essential (primary) hypertension: Secondary | ICD-10-CM

## 2012-02-03 DIAGNOSIS — Z136 Encounter for screening for cardiovascular disorders: Secondary | ICD-10-CM

## 2012-02-03 DIAGNOSIS — R030 Elevated blood-pressure reading, without diagnosis of hypertension: Secondary | ICD-10-CM

## 2012-02-03 DIAGNOSIS — R079 Chest pain, unspecified: Secondary | ICD-10-CM | POA: Insufficient documentation

## 2012-02-03 LAB — LIPID PANEL: HDL: 51.8 mg/dL (ref >39.00)

## 2012-02-03 LAB — CBC WITH DIFFERENTIAL/PLATELET
Basophils Absolute: 0.1 10*3/uL (ref 0.0–0.1)
Basophils Relative: 0.8 % (ref 0.0–3.0)
Eosinophils Absolute: 0.3 10*3/uL (ref 0.0–0.7)
HCT: 39.7 % (ref 36.0–46.0)
Hemoglobin: 12.4 g/dL (ref 12.0–15.0)
Lymphocytes Relative: 52.3 % — ABNORMAL HIGH (ref 12.0–46.0)
Lymphs Abs: 5.2 10*3/uL — ABNORMAL HIGH (ref 0.7–4.0)
MCHC: 31.2 g/dL (ref 30.0–36.0)
MCV: 87.6 fl (ref 78.0–100.0)
Neutro Abs: 3.6 10*3/uL (ref 1.4–7.7)
RBC: 4.53 Mil/uL (ref 3.87–5.11)
RDW: 15.6 % — ABNORMAL HIGH (ref 11.5–14.6)

## 2012-02-03 LAB — COMPREHENSIVE METABOLIC PANEL
ALT: 22 U/L (ref 0–35)
AST: 21 U/L (ref 0–37)
Alkaline Phosphatase: 72 U/L (ref 39–117)
BUN: 12 mg/dL (ref 6–23)
Chloride: 106 mEq/L (ref 96–112)
Creatinine, Ser: 0.8 mg/dL (ref 0.4–1.2)
Total Bilirubin: 0.5 mg/dL (ref 0.3–1.2)

## 2012-02-03 LAB — TSH: TSH: 0.74 u[IU]/mL (ref 0.35–5.50)

## 2012-02-03 LAB — LDL CHOLESTEROL, DIRECT: Direct LDL: 154.9 mg/dL

## 2012-02-03 MED ORDER — LISINOPRIL 20 MG PO TABS: 20.0000 mg | ORAL_TABLET | Freq: Every day | ORAL | Status: DC

## 2012-02-03 NOTE — Patient Instructions (Addendum)
Good to see you. We are starting lisinopril 20 mg daily. Please follow up with me in 2 weeks.  We will call you with your lab results.  Please stop by to see Shirlee Limerick on your way out to set up your cardiology referral.

## 2012-02-03 NOTE — Progress Notes (Signed)
Subjective:    Patient ID: Amanda Kane, female    DOB: [birth year], 71 y.o.   MRN: [id number]  HPI  71 yo female with h/o IC, fibromyalgia here for elevated BP. Has had some CP for past few days- feels like indigestion.  Pain is worsened by exertion and relieved by rest most of the time. She has had some radiation of pain to right side of her neck.  No diaphoresis, nausea or vomiting.  BP has been slowly increasing.  She does not have a BP cuff at home but she can tell it's elevated- face feels flushed, "ears pulsating." No HA or blurred vision.   BP Readings from Last 3 Encounters:  02/03/12 172/98  01/31/12 178/92  01/26/12 160/90   No FH of CAD. She is a non smoker, non diabetic.  Lipid panel has not been checked in years. Lab Results  Component Value Date   CHOL 242* 11/25/2009   HDL 57.00 11/25/2009   LDLDIRECT 156.9 11/25/2009   TRIG 190.0* 11/25/2009   CHOLHDL 4 11/25/2009   Patient Active Problem List  Diagnosis  . MALIGNANT NEOPLASM OF COLON UNSPECIFIED SITE  . COLONIC POLYPS  . HYPOTHYROIDISM NOS  . UNSPECIFIED VITAMIN D DEFICIENCY  . HYPERCHOLESTEROLEMIA, PURE  . HYPERLIPIDEMIA  . OTITIS EXTERNA  . HEARING LOSS  . ESOPHAGITIS, MILD  . ESOPHAGEAL STRICTURE  . GERD  . GASTROPARESIS  . DIVERTICULOSIS-COLON  . DIVERTICULITIS, COLON  . UTI  . BREAST MASS, RIGHT  . ARTHRITIS  . BACK PAIN, LUMBAR  . FIBROMYALGIA  . OSTEOPOROSIS  . COUGH  . DYSPHAGIA UNSPECIFIED  . DYSPHAGIA  . DYSURIA, CHRONIC  . MAMMOGRAM, ABNORMAL, LEFT  . PERSONAL HX COLONIC POLYPS  . Interstitial cystitis  . URI (upper respiratory infection)  . Mesenteric lymphadenopathy  . Conjunctivitis  . Elevated blood pressure  . Chest pain  . Hypertension   Past Medical History  Diagnosis Date  . Arthritis   . Esophageal reflux   . Myalgia and myositis, unspecified   . Pure hypercholesterolemia   . Unspecified hypothyroidism   . Benign neoplasm of colon   . Interstitial cystitis      Past Surgical History  Procedure Date  . Hysteroscopy w/d&c     Dove  . Appendectomy   . Inner ear surgery   . Breast lumpectomy   . Tubal ligation   . Eye surgery     cateracts   History  Substance Use Topics  . Smoking status: Never Smoker   . Smokeless tobacco: Not on file  . Alcohol Use: Not on file   No family history on file. Allergies  Allergen Reactions  . Atorvastatin   . Celecoxib     REACTION: Itch  . Ciprofloxacin   . Ezetimibe     REACTION: Aches  . Macrobid (Nitrofurantoin Macrocrystal)   . Metronidazole   . Pregabalin     REACTION: Numb  . Simvastatin     REACTION: Aches  . Sulfonamide Derivatives    Current Outpatient Prescriptions on File Prior to Visit  Medication Sig Dispense Refill  . albuterol (PROVENTIL HFA;VENTOLIN HFA) 108 (90 BASE) MCG/ACT inhaler Inhale 2 puffs into the lungs every 6 (six) hours as needed for wheezing.  1 Inhaler  0  . amitriptyline (ELAVIL) 10 MG tablet TAKE 1 TABLET (10 MG TOTAL) BY MOUTH AT BEDTIME.  30 tablet  0  . Bepotastine Besilate 1.5 % SOLN 1 drop into affected eye daily.  1 Bottle  0  .  cyclobenzaprine (FLEXERIL) 5 MG tablet Take one by mouth at bedtime as needed. Take one by mouth at bedtime as needed.  30 tablet  3  . FLUoxetine (PROZAC) 20 MG capsule Take one by mouth twice a week      . HYDROcodone-acetaminophen (NORCO) 5-325 MG per tablet Take 1 tablet by mouth every 4 (four) hours as needed.      . lansoprazole (PREVACID) 15 MG capsule Take 1 capsule (15 mg total) by mouth as needed.  30 capsule  11  . phenazopyridine (PYRIDIUM) 100 MG tablet Take 1 tablet (100 mg total) by mouth 3 (three) times daily as needed.  90 tablet  11  . SYNTHROID 75 MCG tablet Take 1 tablet (75 mcg total) by mouth daily.  90 tablet  1  . lisinopril (PRINIVIL,ZESTRIL) 20 MG tablet Take 1 tablet (20 mg total) by mouth daily.  30 tablet  0   The PMH, PSH, Social History, Family History, Medications, and allergies have been reviewed  in [facility], and have been updated if relevant.  EKG shows flattened T waves when compared to EKG from 2010.   Review of Systems See HPI No palpitations No LE edema    Objective:   Physical Exam BP 172/98  Pulse 76  Temp 98 F (36.7 C)  Wt 154 lb (69.854 kg)  General:  Well-developed,well-nourished,in no acute distress; alert,appropriate and cooperative throughout examination Head:  normocephalic and atraumatic.   Lungs:  Normal respiratory effort, chest expands symmetrically. Lungs are clear to auscultation, no crackles or wheezes. Heart:  Normal rate and regular rhythm. S1 and S2 normal without gallop, murmur, click, rub or other extra sounds. Msk:  No deformity or scoliosis noted of thoracic or lumbar spine.   Extremities:  No clubbing, cyanosis, edema, or deformity noted with normal full range of motion of all joints.   Neurologic:  alert & oriented X3 and gait normal.   Skin:  Intact without suspicious lesions or rashes Psych:  Cognition and judgment appear intact. Alert and cooperative with normal attention span and concentration. No apparent delusions, illusions, hallucinations     Assessment & Plan:   1. Elevated blood pressure  New with typical chest pain. Refer to cards for evaluation/stress echo. Start lisinopril 20 mg daily- advised to cut tablet in half for first few days.   Follow up in 2 weeks. EKG 12-Lead, Comprehensive metabolic panel, CBC with Differential  2. Chest pain  Typical with flattened T waves in EKG. Refer to cards to rule out cardiac etiology. Check labs today. Continue PPI. EKG 12-Lead, Ambulatory referral to Cardiology

## 2012-02-04 ENCOUNTER — Other Ambulatory Visit: Payer: Self-pay | Admitting: Family Medicine

## 2012-02-04 DIAGNOSIS — E785 Hyperlipidemia, unspecified: Secondary | ICD-10-CM

## 2012-02-04 DIAGNOSIS — R5383 Other fatigue: Secondary | ICD-10-CM

## 2012-02-08 ENCOUNTER — Telehealth: Payer: Self-pay | Admitting: Gynecology

## 2012-02-08 DIAGNOSIS — N760 Acute vaginitis: Secondary | ICD-10-CM

## 2012-02-08 MED ORDER — FLUCONAZOLE 150 MG PO TABS: 150.0000 mg | ORAL_TABLET | Freq: Once | ORAL | Status: DC

## 2012-02-08 NOTE — Telephone Encounter (Signed)
Patient call regarding script send to her [facility] for her recurrent yeast infection. Wet Prep was normal, patient aware of this. Will send Rx dilfucan 150 mg # 2 to her pharmacy.

## 2012-02-10 ENCOUNTER — Encounter: Payer: Self-pay | Admitting: Cardiology

## 2012-02-10 ENCOUNTER — Ambulatory Visit (INDEPENDENT_AMBULATORY_CARE_PROVIDER_SITE_OTHER): Payer: Medicare Other | Admitting: Cardiology

## 2012-02-10 VITALS — BP 148/78 | HR 83 | Ht 62.0 in | Wt 155.0 lb

## 2012-02-10 DIAGNOSIS — I10 Essential (primary) hypertension: Secondary | ICD-10-CM

## 2012-02-10 DIAGNOSIS — R079 Chest pain, unspecified: Secondary | ICD-10-CM

## 2012-02-10 NOTE — Progress Notes (Signed)
HPI: pleasant 71 year old female with no prior cardiac history other than possible mitral valve prolapse as a child for evaluation of chest pain. Over the past 2 months she has had intermittent chest pain. It is substernal and described as a pinching sensation or pressure. It radiates to her head and ears. It lasts minutes to one hour. She has some associated dyspnea and nausea. The pain increases with eating, inspiration and coughing. She also has dyspnea on exertion which is more of a chronic issue. She has some increased cough with lying flat. There is no pedal edema or syncope. Because of the above we were asked to evaluate.  Current Outpatient Prescriptions  Medication Sig Dispense Refill  . albuterol (PROVENTIL HFA;VENTOLIN HFA) 108 (90 BASE) MCG/ACT inhaler Inhale 2 puffs into the lungs every 6 (six) hours as needed for wheezing.  1 Inhaler  0  . amitriptyline (ELAVIL) 10 MG tablet TAKE 1 TABLET (10 MG TOTAL) BY MOUTH AT BEDTIME.  30 tablet  0  . Bepotastine Besilate 1.5 % SOLN 1 drop into affected eye daily.  1 Bottle  0  . cyclobenzaprine (FLEXERIL) 5 MG tablet Take one by mouth at bedtime as needed. Take one by mouth at bedtime as needed.  30 tablet  3  . Famotidine (PEPCID PO) Take by mouth as needed.      Marland Kitchen FLUoxetine (PROZAC) 20 MG capsule Take one by mouth twice a week      . HYDROcodone-acetaminophen (NORCO) 5-325 MG per tablet Take 1 tablet by mouth every 4 (four) hours as needed.      Marland Kitchen lisinopril (PRINIVIL,ZESTRIL) 20 MG tablet Take 10 mg by mouth daily.      . phenazopyridine (PYRIDIUM) 100 MG tablet Take 1 tablet (100 mg total) by mouth 3 (three) times daily as needed.  90 tablet  11  . SYNTHROID 75 MCG tablet Take 1 tablet (75 mcg total) by mouth daily.  90 tablet  1  . DISCONTD: lisinopril (PRINIVIL,ZESTRIL) 20 MG tablet Take 1 tablet (20 mg total) by mouth daily.  30 tablet  0    Allergies  Allergen Reactions  . Atorvastatin   . Celecoxib     REACTION: Itch  .  Ciprofloxacin   . Ezetimibe     REACTION: Aches  . Macrobid (Nitrofurantoin Macrocrystal)   . Metronidazole   . Pregabalin     REACTION: Numb  . Simvastatin     REACTION: Aches  . Sulfonamide Derivatives     Past Medical History  Diagnosis Date  . Arthritis   . Esophageal reflux   . Myalgia and myositis, unspecified   . Pure hypercholesterolemia   . Unspecified hypothyroidism   . Colonic polyp   . Interstitial cystitis   . Hypertension   . PUD (peptic ulcer disease)   . MVP (mitral valve prolapse)     Past Surgical History  Procedure Date  . Hysteroscopy w/d&c     Dove  . Appendectomy   . Inner ear surgery   . Breast lumpectomy   . Tubal ligation   . Eye surgery     cateracts  . Tonsillectomy     History   Social History  . Marital Status: Married    Spouse Name: N/A    Number of Children: 2  . Years of Education: N/A   Occupational History  .     Social History Main Topics  . Smoking status: Never Smoker   . Smokeless tobacco: Not on file  .  Alcohol Use: Yes     Glass wine per night  . Drug Use: No  . Sexually Active: Not on file   Other Topics Concern  . Not on file   Social History Narrative  . No narrative on file    Family History  Problem Relation Age of Onset  . Coronary artery disease Brother     MI at age 38    ROS: no fevers or chills, productive cough, hemoptysis, dysphasia, odynophagia, melena, hematochezia, dysuria, hematuria, rash, seizure activity, orthopnea, PND, pedal edema, claudication. Remaining systems are negative.  Physical Exam:   Blood pressure 148/78, pulse 83, height 5\' 2"  (1.575 m), weight 155 lb (70.308 kg).  General:  Well developed/well nourished in NAD Skin warm/dry Patient not depressed No peripheral clubbing Back-normal HEENT-normal/normal eyelids Neck supple/normal carotid upstroke bilaterally; no bruits; no JVD; no thyromegaly chest - CTA/ normal expansion, some reproduction of chest pain with  palpating sternum. CV - RRR/normal S1 and S2; no murmurs, rubs or gallops;  PMI nondisplaced Abdomen -NT/ND, no HSM, no mass, + bowel sounds, no bruit 2+ femoral pulses, no bruits Ext-no edema, chords, 2+ DP Neuro-grossly nonfocal  ECG 02/03/2012-sinus rhythm, possible old prior inferior infarct.

## 2012-02-10 NOTE — Patient Instructions (Addendum)
Your physician recommends that you schedule a follow-up appointment in:  AS NEEDED PENDING TEST RESULTS  Your physician has requested that you have a lexiscan myoview. For further information please visit [email or web address]. Please follow instruction sheet, as given.   

## 2012-02-10 NOTE — Assessment & Plan Note (Signed)
Symptoms atypical. Some reproduction with palpating chest. Question musculoskeletal. However multiple risk factors and also question of prior inferior infarct on electrocardiogram. Plan Myoview to exclude ischemia and evaluate perfusion of the inferior wall. If normal will not plan further evaluation.

## 2012-02-10 NOTE — Assessment & Plan Note (Signed)
Blood pressure mildly elevated but she was recently started on lisinopril 10 mg daily. Continue to follow and increase medication as needed. Management per primary care.

## 2012-02-11 ENCOUNTER — Telehealth: Payer: Self-pay

## 2012-02-11 MED ORDER — AMLODIPINE BESYLATE 5 MG PO TABS: 5.0000 mg | ORAL_TABLET | Freq: Every day | ORAL | Status: DC

## 2012-02-11 NOTE — Telephone Encounter (Signed)
Pt taking Lisinopril;started non productive cough after starting Lisinopril. BP at cardiologist 02/10/12 and BP 148/78; pt said cardiologist said BP normal. If pt needs another BP med send to [facility]. Should pt stop Lisinopril?Please advise.

## 2012-02-11 NOTE — Telephone Encounter (Signed)
Yes there is a small population of people that develop a cough with Lisinopril. Please stop taking it- add lisinopril to her allergy list. I prefer not to use HCTZ- given her IC and her sulfa allergy. I will send in rx for amlodipine to her pharmacy. Please come see me in 1-2 weeks.

## 2012-02-11 NOTE — Telephone Encounter (Signed)
Patient notified as instructed by telephone. Pt already scheduled 02/17/12.

## 2012-02-16 ENCOUNTER — Encounter (HOSPITAL_COMMUNITY): Payer: Medicare Other

## 2012-02-16 ENCOUNTER — Ambulatory Visit (HOSPITAL_COMMUNITY): Payer: Medicare Other | Attending: Cardiovascular Disease | Admitting: Radiology

## 2012-02-16 VITALS — BP 145/84 | HR 78 | Ht 62.0 in | Wt 154.0 lb

## 2012-02-16 DIAGNOSIS — Z8249 Family history of ischemic heart disease and other diseases of the circulatory system: Secondary | ICD-10-CM | POA: Insufficient documentation

## 2012-02-16 DIAGNOSIS — R55 Syncope and collapse: Secondary | ICD-10-CM | POA: Insufficient documentation

## 2012-02-16 DIAGNOSIS — R002 Palpitations: Secondary | ICD-10-CM | POA: Insufficient documentation

## 2012-02-16 DIAGNOSIS — R0602 Shortness of breath: Secondary | ICD-10-CM

## 2012-02-16 DIAGNOSIS — R42 Dizziness and giddiness: Secondary | ICD-10-CM | POA: Insufficient documentation

## 2012-02-16 DIAGNOSIS — R0609 Other forms of dyspnea: Secondary | ICD-10-CM | POA: Insufficient documentation

## 2012-02-16 DIAGNOSIS — R9431 Abnormal electrocardiogram [ECG] [EKG]: Secondary | ICD-10-CM

## 2012-02-16 DIAGNOSIS — I10 Essential (primary) hypertension: Secondary | ICD-10-CM | POA: Insufficient documentation

## 2012-02-16 DIAGNOSIS — R0989 Other specified symptoms and signs involving the circulatory and respiratory systems: Secondary | ICD-10-CM | POA: Insufficient documentation

## 2012-02-16 DIAGNOSIS — R079 Chest pain, unspecified: Secondary | ICD-10-CM

## 2012-02-16 MED ORDER — REGADENOSON 0.4 MG/5ML IV SOLN
0.4000 mg | Freq: Once | INTRAVENOUS | Status: AC
  Administered 2012-02-16: 0.4 mg via INTRAVENOUS

## 2012-02-16 MED ORDER — TECHNETIUM TC 99M SESTAMIBI GENERIC - CARDIOLITE
11.0000 | Freq: Once | INTRAVENOUS | Status: AC | PRN
  Administered 2012-02-16: 11 via INTRAVENOUS

## 2012-02-16 MED ORDER — TECHNETIUM TC 99M SESTAMIBI GENERIC - CARDIOLITE
33.0000 | Freq: Once | INTRAVENOUS | Status: AC | PRN
  Administered 2012-02-16: 33 via INTRAVENOUS

## 2012-02-16 NOTE — Progress Notes (Signed)
[facility] SITE 3 NUCLEAR MED [id number] [place] [address] [phone]  Cardiology Nuclear Med Study  Amanda Kane is a 71 y.o. female     MRN : [id number]     DOB: 08/22/1940  Procedure Date: 02/16/2012  Nuclear Med Background Indication for Stress Test:  Evaluation for Ischemia and Abnormal EKG History:  '08 Echo:EF=60%, mild to moderate TR Cardiac Risk Factors: Family History - CAD, Hypertension and Lipids  Symptoms:  Chest Pain/Pressure with and without Exertion (last episode of chest discomfort is now, 6/10), Dizziness, DOE, Fatigue, Nausea/SOB with CP, Near Syncope, Palpitations and Rapid HR    Nuclear Pre-Procedure Caffeine/Decaff Intake:  None NPO After: 10:00pm   Lungs:  Clear. O2 Sat: 99% on room air. IV 0.9% NS with Angio Cath:  22g  IV Site: R Antecubital  IV Started by:  Bonnita Levan, RN  Chest Size (in):  38 Cup Size: C  Height: 5\' 2"  (1.575 m)  Weight:  154 lb (69.854 kg)  BMI:  Body mass index is 28.17 kg/(m^2). Tech Comments:  N/A    Nuclear Med Study 1 or 2 day study: 1 day  Stress Test Type:  Lexiscan  Reading MD: Kristeen Miss, MD  Order Authorizing Provider:  Olga Millers, MD  Resting Radionuclide: Technetium 47m Sestamibi  Resting Radionuclide Dose: 11.0 mCi   Stress Radionuclide:  Technetium 57m Sestamibi  Stress Radionuclide Dose: 33.0 mCi           Stress Protocol Rest HR: 78 Stress HR: 113  Rest BP: 145/84 Stress BP: 172/81  Exercise Time (min): 2:00 METS: n/a   Predicted Max HR: 152 bpm % Max HR: 74.34 bpm Rate Pressure Product: 62952   Dose of Adenosine (mg):  n/a Dose of Lexiscan: 0.4 mg  Dose of Atropine (mg): n/a Dose of Dobutamine: n/a mcg/kg/min (at max HR)  Stress Test Technologist: Smiley Houseman, CMA-N  Nuclear Technologist:  Domenic Polite, CNMT     Rest Procedure:  Myocardial perfusion imaging was performed at rest 45 minutes following the intravenous administration of Technetium 51m  Sestamibi.  Rest ECG: No acute changes.  Stress Procedure:  The patient received IV Lexiscan 0.4 mg over 15-seconds with concurrent low level exercise and then Technetium 63m Sestamibi was injected at 30-seconds while the patient continued walking one more minute. There were no significant changes with Lexiscan. She did c/o chest pressure, 7-8/10, with Lexiscan.  Quantitative spect images were obtained after a 45-minute delay.  Stress ECG: No significant change from baseline ECG  QPS Raw Data Images:  Normal; no motion artifact; normal heart/lung ratio. Stress Images:  Normal homogeneous uptake in all areas of the myocardium. Rest Images:  Normal homogeneous uptake in all areas of the myocardium. Subtraction (SDS):  No evidence of ischemia. Transient Ischemic Dilatation (Normal <1.22):  1.08 Lung/Heart Ratio (Normal <0.45):  0.26  Quantitative Gated Spect Images QGS EDV:  51 ml QGS ESV:  10 ml  Impression Exercise Capacity:  Lexiscan with low level exercise. BP Response:  Normal blood pressure response. Clinical Symptoms:  No significant symptoms noted. ECG Impression:  No significant ST segment change suggestive of ischemia. Comparison with Prior Nuclear Study: No previous nuclear study performed  Overall Impression:  Normal stress nuclear study.  No evidence of ischemia.    LV Ejection Fraction: 81%.  LV Wall Motion:  NL LV Function; NL Wall Motion.    Vesta Mixer, Montez Hageman., MD, [facility] 02/16/2012, 5:22 PM Office - [phone]  Pager [phone]

## 2012-02-17 ENCOUNTER — Encounter: Payer: Self-pay | Admitting: Family Medicine

## 2012-02-17 ENCOUNTER — Ambulatory Visit (INDEPENDENT_AMBULATORY_CARE_PROVIDER_SITE_OTHER): Payer: Medicare Other | Admitting: Family Medicine

## 2012-02-17 VITALS — BP 138/84 | HR 85 | Temp 98.0°F | Wt 154.0 lb

## 2012-02-17 DIAGNOSIS — I10 Essential (primary) hypertension: Secondary | ICD-10-CM

## 2012-02-17 NOTE — Progress Notes (Signed)
Subjective:    Patient ID: Amanda Kane, female    DOB: May 10, 1941, 71 y.o.   MRN: [id number]  HPI  71 yo female with h/o IC, fibromyalgia here for HTN.   Started lisinopril but developed a dry cough, so d/c'd lisinopril and start Norvasc 5 mg daily last wekk. She has been tolerating this well.  No LE edema.  No CP, HA, or blurred vision.  Had stress test yesterday- negative.  Patient Active Problem List  Diagnosis  . MALIGNANT NEOPLASM OF COLON UNSPECIFIED SITE  . COLONIC POLYPS  . HYPOTHYROIDISM NOS  . UNSPECIFIED VITAMIN D DEFICIENCY  . HYPERCHOLESTEROLEMIA, PURE  . HYPERLIPIDEMIA  . OTITIS EXTERNA  . HEARING LOSS  . ESOPHAGITIS, MILD  . ESOPHAGEAL STRICTURE  . GERD  . GASTROPARESIS  . DIVERTICULOSIS-COLON  . DIVERTICULITIS, COLON  . UTI  . BREAST MASS, RIGHT  . ARTHRITIS  . BACK PAIN, LUMBAR  . FIBROMYALGIA  . OSTEOPOROSIS  . COUGH  . DYSPHAGIA UNSPECIFIED  . DYSPHAGIA  . DYSURIA, CHRONIC  . MAMMOGRAM, ABNORMAL, LEFT  . PERSONAL HX COLONIC POLYPS  . Interstitial cystitis  . URI (upper respiratory infection)  . Mesenteric lymphadenopathy  . Conjunctivitis  . Elevated blood pressure  . Chest pain  . Hypertension   Past Medical History  Diagnosis Date  . Arthritis   . Esophageal reflux   . Myalgia and myositis, unspecified   . Pure hypercholesterolemia   . Unspecified hypothyroidism   . Colonic polyp   . Interstitial cystitis   . Hypertension   . PUD (peptic ulcer disease)   . MVP (mitral valve prolapse)    Past Surgical History  Procedure Date  . Hysteroscopy w/d&c     Dove  . Appendectomy   . Inner ear surgery   . Breast lumpectomy   . Tubal ligation   . Eye surgery     cateracts  . Tonsillectomy    History  Substance Use Topics  . Smoking status: Never Smoker   . Smokeless tobacco: Not on file  . Alcohol Use: Yes     Glass wine per night   Family History  Problem Relation Age of Onset  . Coronary artery disease Brother    MI at age 78   Allergies  Allergen Reactions  . Atorvastatin   . Celecoxib     REACTION: Itch  . Ciprofloxacin   . Ezetimibe     REACTION: Aches  . Lisinopril Cough  . Macrobid (Nitrofurantoin Macrocrystal)   . Metronidazole   . Pregabalin     REACTION: Numb  . Simvastatin     REACTION: Aches  . Sulfonamide Derivatives    Current Outpatient Prescriptions on File Prior to Visit  Medication Sig Dispense Refill  . albuterol (PROVENTIL HFA;VENTOLIN HFA) 108 (90 BASE) MCG/ACT inhaler Inhale 2 puffs into the lungs every 6 (six) hours as needed for wheezing.  1 Inhaler  0  . amitriptyline (ELAVIL) 10 MG tablet TAKE 1 TABLET (10 MG TOTAL) BY MOUTH AT BEDTIME.  30 tablet  0  . amLODipine (NORVASC) 5 MG tablet Take 1 tablet (5 mg total) by mouth daily.  30 tablet  3  . Bepotastine Besilate 1.5 % SOLN 1 drop into affected eye daily.  1 Bottle  0  . cyclobenzaprine (FLEXERIL) 5 MG tablet Take one by mouth at bedtime as needed. Take one by mouth at bedtime as needed.  30 tablet  3  . Famotidine (PEPCID PO) Take by mouth as needed.      Marland Kitchen  FLUoxetine (PROZAC) 20 MG capsule Take one by mouth twice a week      . HYDROcodone-acetaminophen (NORCO) 5-325 MG per tablet Take 1 tablet by mouth every 4 (four) hours as needed.      . phenazopyridine (PYRIDIUM) 100 MG tablet Take 1 tablet (100 mg total) by mouth 3 (three) times daily as needed.  90 tablet  11  . SYNTHROID 75 MCG tablet Take 1 tablet (75 mcg total) by mouth daily.  90 tablet  1   The PMH, PSH, Social History, Family History, Medications, and allergies have been reviewed in [facility], and have been updated if relevant.  EKG shows flattened T waves when compared to EKG from 2010.   Review of Systems See HPI No palpitations No LE edema    Objective:   Physical Exam BP 138/84  Pulse 85  Temp 98 F (36.7 C)  Wt 154 lb (69.854 kg) Wt Readings from Last 3 Encounters:  02/17/12 154 lb (69.854 kg)  02/16/12 154 lb (69.854 kg)  02/10/12 155  lb (70.308 kg)     General:  Well-developed,well-nourished,in no acute distress; alert,appropriate and cooperative throughout examination Head:  normocephalic and atraumatic.   Lungs:  Normal respiratory effort, chest expands symmetrically. Lungs are clear to auscultation, no crackles or wheezes. Heart:  Normal rate and regular rhythm. S1 and S2 normal without gallop, murmur, click, rub or other extra sounds. Msk:  No deformity or scoliosis noted of thoracic or lumbar spine.   Extremities:  No clubbing, cyanosis, edema, or deformity noted with normal full range of motion of all joints.   Neurologic:  alert & oriented X3 and gait normal.   Skin:  Intact without suspicious lesions or rashes Psych:  Cognition and judgment appear intact. Alert and cooperative with normal attention span and concentration. No apparent delusions, illusions, hallucinations     Assessment & Plan:    1. Hypertension    Improved on amlodipine. Continue amlodipine at current dose. Follow up as needed. The patient indicates understanding of these issues and agrees with the plan.

## 2012-02-18 ENCOUNTER — Other Ambulatory Visit: Payer: Medicare Other

## 2012-02-20 ENCOUNTER — Other Ambulatory Visit: Payer: Self-pay | Admitting: Family Medicine

## 2012-02-25 ENCOUNTER — Ambulatory Visit (INDEPENDENT_AMBULATORY_CARE_PROVIDER_SITE_OTHER)
Admission: RE | Admit: 2012-02-25 | Discharge: 2012-02-25 | Disposition: A | Discharge status: Home / Self Care | Payer: Medicare Other | Source: Ambulatory Visit | Attending: Family Medicine | Admitting: Family Medicine

## 2012-02-25 ENCOUNTER — Telehealth: Payer: Self-pay | Admitting: Oncology

## 2012-02-25 ENCOUNTER — Other Ambulatory Visit: Payer: Self-pay | Admitting: Family Medicine

## 2012-02-25 ENCOUNTER — Encounter: Payer: Self-pay | Admitting: Oncology

## 2012-02-25 DIAGNOSIS — R590 Localized enlarged lymph nodes: Secondary | ICD-10-CM

## 2012-02-25 DIAGNOSIS — R599 Enlarged lymph nodes, unspecified: Secondary | ICD-10-CM

## 2012-02-25 DIAGNOSIS — R591 Generalized enlarged lymph nodes: Secondary | ICD-10-CM | POA: Insufficient documentation

## 2012-02-25 HISTORY — DX: Enlarged lymph nodes, unspecified: R59.9

## 2012-02-25 MED ORDER — IOHEXOL 300 MG/ML  SOLN
100.0000 mL | Freq: Once | INTRAMUSCULAR | Status: AC | PRN
  Administered 2012-02-25: 100 mL via INTRAVENOUS

## 2012-02-25 NOTE — Telephone Encounter (Signed)
S/W pt in re NP appt 10/16 @ 10:30 w/ Dr. Gaylyn Rong Referring Dr. Dayton Martes Dx- Mesenteric Lymphadenopathy NP packet mailed.

## 2012-02-28 ENCOUNTER — Encounter: Payer: Self-pay | Admitting: Oncology

## 2012-02-28 NOTE — Patient Instructions (Addendum)
A.  CT result 02/25/2012.  CT ABDOMEN AND PELVIS WITH CONTRAST  Technique: Multidetector CT imaging of the abdomen and pelvis was  performed following the standard protocol during bolus  administration of intravenous contrast.  Contrast: OMNIPAQUE IOHEXOL 300 MG/ML SOLN  Comparison: CT 11/24/2011  Findings: Lung bases are clear. No pericardial fluid.  Again demonstrated multiple prominent central mesenteric lymph  nodes which extends into the ileocecal mesentery. Individual lymph  nodes are slightly increased in size in the interval. For example  the largest of the central mesentery node measures 11 mm short axis  on today's scan (image 41) compared to 8 mm on prior. 8 mm lymph  node (image 56) increased from 7 mm on prior. The ileocecal  mesentery are similar in size measuring 10 mm short axis compared  to 10 mm on prior. There is no periportal lymphadenopathy. No  significant iliac lymphadenopathy. No inguinal lymphadenopathy.  No focal hepatic lesion. The gallbladder, pancreas, spleen,  adrenal glands are normal. The spleen is equal volume to  comparison exam.  The stomach, small bowel and colon are unchanged. There are  diverticula the sigmoid colon.  Abdominal aorta is normal caliber. Small periaortic lymph nodes  are not pathologic by size criteria. No change in  The bladder and uterus are normal. No aggressive osseous lesions.  IMPRESSION:  1. Prominent central mesenteric lymph nodes are mildly increased  in size compared to prior. Ileocecal lymph nodes are similar size.  Concern for lymphoproliferative disorder. Recommend hematological  workup and recommend follow-up CT scan or FDG PET CT scan.  2. Diverticulosis evidence of diverticulitis.  B.  Possible causes: - reactive process versus sarcoidosis versus chronic inflammation versus lymphoma.   C.  Work up:  PET scan (Pros:  Noninvasive.  Cons:  Low yield when node is <2cm)  versus proceeding with biopsy  (Pros:  more definitive than PET scan.  Cons:  Could miss the lesion since they are still small.  Potential risks of procedure includes but not limited to infection, bleeding.

## 2012-03-01 ENCOUNTER — Ambulatory Visit: Payer: Medicare Other

## 2012-03-01 ENCOUNTER — Other Ambulatory Visit (HOSPITAL_BASED_OUTPATIENT_CLINIC_OR_DEPARTMENT_OTHER): Payer: Medicare Other | Admitting: Lab

## 2012-03-01 ENCOUNTER — Ambulatory Visit (HOSPITAL_BASED_OUTPATIENT_CLINIC_OR_DEPARTMENT_OTHER): Payer: Medicare Other | Admitting: Oncology

## 2012-03-01 ENCOUNTER — Telehealth: Payer: Self-pay | Admitting: Oncology

## 2012-03-01 ENCOUNTER — Encounter: Payer: Self-pay | Admitting: Oncology

## 2012-03-01 VITALS — BP 154/83 | HR 87 | Temp 97.7°F | Resp 20 | Ht 62.0 in | Wt 154.4 lb

## 2012-03-01 DIAGNOSIS — R599 Enlarged lymph nodes, unspecified: Secondary | ICD-10-CM

## 2012-03-01 DIAGNOSIS — R591 Generalized enlarged lymph nodes: Secondary | ICD-10-CM

## 2012-03-01 LAB — LACTATE DEHYDROGENASE (CC13): LDH: 213 U/L (ref 125–220)

## 2012-03-01 LAB — ANGIOTENSIN CONVERTING ENZYME: Angiotensin 1 CE: 31 U/L (ref 8–52)

## 2012-03-01 NOTE — Telephone Encounter (Signed)
appts made and printed for pt aom °

## 2012-03-01 NOTE — Progress Notes (Signed)
[facility]  [phone] Fax:([phone]     INITIAL HEMATOLOGY CONSULTATION    Referral MD:  Dr. Bryn Gulling. Amanda Kane, M.D.   Reason for Referral: retroperitoneal lymphadenopathy.     HPI:  [name] is a 71 year-old woman with history of diverticulosis and recurrent diverticulitis.  Her last episode of diverticulitis was about 2 years ago.  She had had symptoms of interstitial cystitis.  She was evaluated by Urology Dr. Alfredo Martinez.  A routine CT abdomen was obtained on 11/24/2011 which showed shotty abdominal adenopathy.  Decision was for watchful observation.  A follow up CT abdomen was obtained on 02/25/2012.  I personally reviewed this CT and showed the images to patient and her husband.  Again demonstrated multiple prominent central mesenteric lymph nodes which extends into the ileocecal mesentery. Individual lymph nodes are slightly increased in size in the interval. The largest of the central mesentery node measures 11 mm compared to 8 mm on prior. 8 mm lymph node increased from 7 mm on prior. The ileocecal mesentery are similar in size measuring 10 mm short axis compared to 10 mm on prior. There is no periportal lymphadenopathy. No significant iliac lymphadenopathy. No inguinal lymphadenopathy.  Given this slight enlargement of the nodes, she was kindly referred to the [facility] for evaluation.   Mrs. [name] presented to the clinic for the first time today with her husband.  She reported slight chronic fatigue for many years which she attributed to fibromyalgia.  She denied any palpable adenopathy.  She denied night sweat.  She has chronic, mild, diffuse, crampy abdominal pain.  There is no known exacerbating or relieving factors.  The abdominal pain has been there for the past 5 years due to diverticulosis.  The abdominal pain has not changed in location or characteristics for the past few years.  She has had chronic paresthesia of her fingers and  toes for many years.     Patient denies fever, anorexia, weight loss, headache, visual changes, confusion, drenching night sweats, palpable lymph node swelling, mucositis, odynophagia, dysphagia, nausea vomiting, jaundice, chest pain, palpitation, shortness of breath, dyspnea on exertion, productive cough, gum bleeding, epistaxis, hematemesis, hemoptysis, abdominal swelling, early satiety, melena, hematochezia, hematuria, skin rash, spontaneous bleeding, heat or cold intolerance, bowel bladder incontinence, back pain, focal motor weakness.     Past Medical History  Diagnosis Date  . Arthritis   . Esophageal reflux   . Myalgia and myositis, unspecified   . Pure hypercholesterolemia   . Unspecified hypothyroidism   . Colonic polyp     last one around 2008.  Due one now.   . Interstitial cystitis   . Hypertension   . PUD (peptic ulcer disease)   . MVP (mitral valve prolapse)   . Adenopathy 02/25/2012  . Diverticulosis     past diverticulitis; last 2 years ago. no resection.   . Fibromyalgia   :    Past Surgical History  Procedure Date  . Hysteroscopy w/d&c     Dove  . Appendectomy   . Inner ear surgery   . Breast lumpectomy     bilateral; benign  . Tubal ligation   . Eye surgery     cateracts  . Tonsillectomy   . Left knee surgery   :   CURRENT MEDS: Current Outpatient Prescriptions  Medication Sig Dispense Refill  . albuterol (PROVENTIL HFA;VENTOLIN HFA) 108 (90 BASE) MCG/ACT inhaler Inhale 2 puffs into the lungs every 6 (six) hours  as needed for wheezing.  1 Inhaler  0  . amitriptyline (ELAVIL) 10 MG tablet TAKE 1 TABLET (10 MG TOTAL) BY MOUTH AT BEDTIME.  30 tablet  0  . amLODipine (NORVASC) 5 MG tablet Take 1 tablet (5 mg total) by mouth daily.  30 tablet  3  . Bepotastine Besilate 1.5 % SOLN 1 drop into affected eye daily.  1 Bottle  0  . cyclobenzaprine (FLEXERIL) 5 MG tablet Take one by mouth at bedtime as needed. Take one by mouth at bedtime as needed.  30  tablet  3  . Famotidine (PEPCID PO) Take by mouth as needed.      Marland Kitchen HYDROcodone-acetaminophen (NORCO) 5-325 MG per tablet Take 1 tablet by mouth every 4 (four) hours as needed.      . phenazopyridine (PYRIDIUM) 100 MG tablet Take 1 tablet (100 mg total) by mouth 3 (three) times daily as needed.  90 tablet  11  . SYNTHROID 75 MCG tablet Take 1 tablet (75 mcg total) by mouth daily.  90 tablet  1      Allergies  Allergen Reactions  . Atorvastatin   . Celecoxib     REACTION: Itch  . Ciprofloxacin   . Ezetimibe     REACTION: Aches  . Lisinopril Cough  . Macrobid (Nitrofurantoin Macrocrystal)   . Metronidazole   . Pregabalin     REACTION: Numb  . Simvastatin     REACTION: Aches  . Sulfonamide Derivatives   :  Family History  Problem Relation Age of Onset  . Coronary artery disease Brother     MI at age 68  . Heart failure Mother   . Stroke Father   :  History   Social History  . Marital Status: Married    Spouse Name: N/A    Number of Children: 2  . Years of Education: N/A   Occupational History  .      retired Runner, broadcasting/film/video   Social History Main Topics  . Smoking status: Never Smoker   . Smokeless tobacco: Not on file  . Alcohol Use: Yes     Glass wine per night  . Drug Use: No  . Sexually Active: Not on file   Other Topics Concern  . Not on file   Social History Narrative  . No narrative on file  :  REVIEW OF SYSTEM:  The rest of the 14-point review of sytem was negative.   Exam: ECOG 1.   General:  well-nourished woman, in no acute distress.  Eyes:  no scleral icterus.  ENT:  There were no oropharyngeal lesions.  Neck was without thyromegaly.  Lymphatics:  Negative cervical, supraclavicular or axillary adenopathy.  Respiratory: lungs were clear bilaterally without wheezing or crackles.  Cardiovascular:  Regular rate and rhythm, S1/S2, without murmur, rub or gallop.  There was no pedal edema.  GI:  abdomen was soft, flat, nontender, nondistended, without  organomegaly.  Muscoloskeletal:  no spinal tenderness of palpation of vertebral spine.  Skin exam was without echymosis, petichae.  Neuro exam was nonfocal.  Patient was able to get on and off exam table without assistance.  Gait was normal.  Patient was alerted and oriented.  Attention was good.   Language was appropriate.  Mood was normal without depression.  Speech was not pressured.  Thought content was not tangential.    LABS:  Lab Results  Component Value Date   WBC 9.9 02/03/2012   HGB 12.4 02/03/2012   HCT 39.7 02/03/2012  PLT 288.0 02/03/2012   GLUCOSE 85 02/03/2012   CHOL 240* 02/03/2012   TRIG 163.0* 02/03/2012   HDL 51.80 02/03/2012   LDLDIRECT 154.9 02/03/2012   ALT 22 02/03/2012   AST 21 02/03/2012   NA 140 02/03/2012   K 4.7 02/03/2012   CL 106 02/03/2012   CREATININE 0.8 02/03/2012   BUN 12 02/03/2012   CO2 27 02/03/2012    Ct Abdomen Pelvis W Contrast  02/25/2012  **ADDENDUM** CREATED: 02/25/2012 11:59:35  Voice recognition error:  The second impression should read, "Diverticulosis without evidence of diverticulitis."  **END ADDENDUM** SIGNED BY: Genevive Bi, M.D.   02/25/2012  *RADIOLOGY REPORT*  Clinical Data: Mesenteric lymphadenopathy.  CT ABDOMEN AND PELVIS WITH CONTRAST  Technique:  Multidetector CT imaging of the abdomen and pelvis was performed following the standard protocol during bolus administration of intravenous contrast.  Contrast: OMNIPAQUE IOHEXOL 300 MG/ML  SOLN  Comparison: CT 11/24/2011  Findings: Lung bases are clear.  No pericardial fluid.  Again demonstrated multiple prominent central mesenteric lymph nodes which extends into the ileocecal mesentery.  Individual lymph nodes are slightly increased in size in the interval.  For example the largest of the central mesentery node measures 11 mm short axis on today's scan (image 41) compared to 8 mm on prior. 8 mm lymph node (image 56) increased from 7 mm on prior. The ileocecal mesentery are similar in size  measuring 10 mm short axis compared to 10 mm on prior.  There is no periportal lymphadenopathy.  No significant iliac lymphadenopathy.  No inguinal lymphadenopathy.  No focal hepatic lesion.  The gallbladder, pancreas, spleen, adrenal glands are normal.  The spleen is equal volume to comparison exam.  The stomach, small bowel and colon are unchanged.  There are diverticula the sigmoid colon.  Abdominal aorta is normal caliber.  Small periaortic lymph nodes are not pathologic by size criteria.  No change in  The bladder and uterus are normal.  No aggressive osseous lesions.  IMPRESSION:  1.  Prominent central mesenteric lymph nodes are mildly increased in size compared to prior.  Ileocecal lymph nodes are similar size. Concern for lymphoproliferative disorder.  Recommend hematological workup and  recommend follow-up CT scan or FDG  PET CT scan. 2.  Diverticulosis evidence of diverticulitis.  These results will be called to the ordering clinician or representative by the Radiologist Assistant, and communication documented in the PACS Dashboard.  Original Report Authenticated By: Genevive Bi, M.D.      ASSESSMENT AND PLAN:   1.  Depression/ fibromyalgia:  She is on Elavil, Flexeril, Norco per PCP.  2.  Hypertension:  She is on Norvasc per PCP.  3.  Hypothyroidism:  She is on Synthroid per PCP.  4.  Interstitial Cystitis:  She is on Pyridium per Urology.  5.  Chronic diverticulitis:  On antibiotics when she has an episode.  5.  Abdominal adenopathy:  - Possible causes:  reactive process (from chronic diverticulitis)versus sarcoidosis versus chronic inflammation versus lymphoma.  -  Work up:  PET scan (Pros:  Noninvasive.  Cons:  Low yield when node is <2cm)  versus proceeding with biopsy  (Pros: more definitive than PET scan.  Cons:  Could miss the lesion since they are still small.  Potential risks of procedure includes but not limited to infection, bleeding.   Another recommendation is watchful  observation.  Her adenopathy is not severe and is not symptomatic.  Her CBC, CMET, and LDH have been normal arguing against  an aggressive lymphoma process if this were to be lymphoma.  She would like to continue watchful observation for now.  I recommended repeating CT abdomen/pelvis in about 6 months with follow up with me the day after.   - I advised her to contact us in the interval if her abdominal pain significantly worsens or any concerning symptoms.    Thank you for this referral.    The length of time of the face-to-face encounter was 45 minutes. More than 50% of time was spent counseling and coordination of care.

## 2012-03-03 ENCOUNTER — Other Ambulatory Visit: Payer: Self-pay | Admitting: *Deleted

## 2012-03-03 DIAGNOSIS — N3010 Interstitial cystitis (chronic) without hematuria: Secondary | ICD-10-CM

## 2012-03-03 MED ORDER — PHENAZOPYRIDINE HCL 100 MG PO TABS: 100.0000 mg | ORAL_TABLET | Freq: Three times a day (TID) | ORAL | Status: DC | PRN

## 2012-03-03 NOTE — Telephone Encounter (Signed)
Left message advising patient script has been sent to pharmacy. 

## 2012-03-03 NOTE — Telephone Encounter (Signed)
Yes I am ok with prescribing it. Rx sent.

## 2012-03-03 NOTE — Telephone Encounter (Signed)
We received a faxed refill request for phenazopyridine 100 mg, Pt originally had this rx prescribed by Dr Marice Potter and was given #90 with 11 refills in 2/13. I called pt to verify if she needed rx ans she states that her insurance change and they will now only cover refills sent to a [facility]. Previous rx went to [place]. Are you ok with prescribing this med or can we route this to Dr Marice Potter, since she is the original prescriber and treating physician.

## 2012-03-07 ENCOUNTER — Encounter: Payer: Self-pay | Admitting: Family Medicine

## 2012-03-07 ENCOUNTER — Ambulatory Visit (INDEPENDENT_AMBULATORY_CARE_PROVIDER_SITE_OTHER): Payer: Medicare Other | Admitting: Family Medicine

## 2012-03-07 ENCOUNTER — Ambulatory Visit: Payer: Self-pay | Admitting: Orthopedic Surgery

## 2012-03-07 VITALS — BP 140/88 | HR 88 | Temp 98.2°F | Wt 155.0 lb

## 2012-03-07 DIAGNOSIS — N390 Urinary tract infection, site not specified: Secondary | ICD-10-CM

## 2012-03-07 LAB — POCT URINALYSIS DIPSTICK
Ketones, UA: NEGATIVE
Nitrite, UA: NEGATIVE
Protein, UA: NEGATIVE
pH, UA: 6

## 2012-03-07 MED ORDER — CEPHALEXIN 500 MG PO CAPS: 500.0000 mg | ORAL_CAPSULE | Freq: Two times a day (BID) | ORAL | Status: DC

## 2012-03-07 MED ORDER — AMLODIPINE BESYLATE 5 MG PO TABS: 5.0000 mg | ORAL_TABLET | Freq: Every day | ORAL | Status: DC

## 2012-03-07 NOTE — Progress Notes (Signed)
  Subjective:    Patient ID: Amanda Kane, female    DOB: 22-Nov-1940, 71 y.o.   MRN: [id number]  HPI 71 yo female well known to me with h/o IC here for increased urinary frequency and dysuria.    Followed by urology- last saw Dr. Sherron Monday in July.  At that time, he recommended prophlyactic macrobid and continued Elavil for IC. She can no longer take macrobid- last time she took it, caused diffuse itching and malaise.  She has multiple abx allergies-  Including cipro, sulfa, and macrobid.  Last urine cx in June 2013- >100,000 E. Coli- multiple drug resistances.   Past Medical History  Diagnosis Date  . Arthritis   . Esophageal reflux   . Myalgia and myositis, unspecified   . Pure hypercholesterolemia   . Unspecified hypothyroidism   . Colonic polyp     last one around 2008.  Due one now.   . Interstitial cystitis   . Hypertension   . PUD (peptic ulcer disease)   . MVP (mitral valve prolapse)   . Adenopathy 02/25/2012  . Diverticulosis     past diverticulitis; last 2 years ago. no resection.   . Fibromyalgia    Past Surgical History  Procedure Date  . Hysteroscopy w/d&c     Dove  . Appendectomy   . Inner ear surgery   . Breast lumpectomy     bilateral; benign  . Tubal ligation   . Eye surgery     cateracts  . Tonsillectomy   . Left knee surgery     reports that she has never smoked. She does not have any smokeless tobacco history on file. She reports that she drinks alcohol. She reports that she does not use illicit drugs. family history includes Coronary artery disease in her brother; Heart failure in her mother; and Stroke in her father. Allergies  Allergen Reactions  . Atorvastatin   . Celecoxib     REACTION: Itch  . Ciprofloxacin   . Ezetimibe     REACTION: Aches  . Lisinopril Cough  . Macrobid (Nitrofurantoin Macrocrystal)   . Metronidazole   . Pregabalin     REACTION: Numb  . Simvastatin     REACTION: Aches  . Sulfonamide Derivatives       Review of Systems  Constitutional: Negative for fever, chills and appetite change.  Gastrointestinal: Negative for nausea, vomiting, abdominal pain, diarrhea and constipation.  Genitourinary: Positive for dysuria and frequency. Negative for hematuria.  Musculoskeletal: Negative for back pain.  Neurological: Negative for dizziness.       Objective:   Physical Exam  BP 140/88  Pulse 88  Temp 98.2 F (36.8 C)  Wt 155 lb (70.308 kg)  Constitutional: She appears well-developed and well-nourished.  Cardiovascular: Normal rate and regular rhythm.   Pulmonary/Chest: Effort normal and breath sounds normal. No respiratory distress.  Abd:  Mild suprapubic tenderness, NO CVA tenderness     Assessment & Plan:   1. UTI    UA positive for LE and RBCs. Will start on Keflex, sent urine for cx given multiple resistances E. Coli this past summer.  The patient indicates understanding of these issues and agrees with the plan.

## 2012-03-07 NOTE — Patient Instructions (Addendum)
Good to see you. Please take Keflex as directed- 1 tablet twice daily for 7 days. We will call you in two days with your culture results.

## 2012-03-07 NOTE — Addendum Note (Signed)
Addended by: Eliezer Bottom on: 03/07/2012 08:05 AM   Modules accepted: Orders

## 2012-03-28 ENCOUNTER — Other Ambulatory Visit: Payer: Self-pay | Admitting: Family Medicine

## 2012-04-27 ENCOUNTER — Telehealth: Payer: Self-pay | Admitting: Family Medicine

## 2012-04-27 NOTE — Telephone Encounter (Signed)
Yes, ok to move up sooner- include CBC

## 2012-04-27 NOTE — Telephone Encounter (Signed)
Patient Information:  Caller Name: Mirriam  Phone: [phone]  Patient: Amanda Kane, Amanda Kane  Gender: Female  DOB: [birth year]  Age: 71 Years  PCP: [facility])  Office Follow Up:  Does the office need to follow up with this patient?: Yes  Instructions For The Office: Please follow up with patient in regards to request to move labs up to earlier date before being seen in office.  Please see nurses notes.  RN Note:  Headache 6/10 has not taken anything today for headache encouraged to go ahead and take Tylenol for pain. States headache is a dull achy feeling.  States that lump is the size of a quarter and is raised 1/4 of an inch behind rt ear.  Tender to the touch and non moving when palpated.  States can feel the lymph node on rt side when swallowing however is not having a sore throat or difficulty breathing.  Patient states that she has blood work scheduled for Thursday and feels that it may be more beneficial if that blood work can be moved up prior to seeing the doctor.  Symptoms  Reason For Call & Symptoms: Lump under Rt ear and Headache  Reviewed Health History In EMR: Yes  Reviewed Medications In EMR: Yes  Reviewed Allergies In EMR: Yes  Reviewed Surgeries / Procedures: Yes  Date of Onset of Symptoms: 04/25/2012  Treatments Tried: Tylenol 325mg  x 1 tab  Treatments Tried Worked: Yes  Guideline(s) Used:  Lymph Nodes - Swollen  Disposition Per Guideline:   See Today or Tomorrow in Office  Reason For Disposition Reached:   Patient wants to be seen  Advice Given:  Pain and Fever Medicines:  Acetaminophen (e.g., Tylenol):  Regular Strength Tylenol: Take 650 mg (two 325 mg pills) by mouth every 4-6 hours as needed. Each Regular Strength Tylenol pill has 325 mg of acetaminophen.  Call Back If:  Overlying skin becomes red  Fever more than 103 F (39.4 C) occurs  You become worse or are worried about a lymph node.  Patient Refused Recommendation:  Patient Refused  Appt, Patient Requests Appt At Later Date  Patient would like to see if blood work can be done sooner than Thursday 05/04/12 prior to seeing the doctor so that more information will be available when being evaluated for swollen lymph node of neck.  Patient has previously had a CAT scan and had swollen lymph nodes in abdomen.

## 2012-04-28 NOTE — Telephone Encounter (Signed)
Lab appt scheduled for 12/16.  Patient advised.

## 2012-05-01 ENCOUNTER — Other Ambulatory Visit (INDEPENDENT_AMBULATORY_CARE_PROVIDER_SITE_OTHER): Payer: Medicare Other

## 2012-05-01 DIAGNOSIS — R5383 Other fatigue: Secondary | ICD-10-CM

## 2012-05-01 DIAGNOSIS — E785 Hyperlipidemia, unspecified: Secondary | ICD-10-CM

## 2012-05-01 LAB — LIPID PANEL
Cholesterol: 232 mg/dL — ABNORMAL HIGH (ref 0–200)
HDL: 52.8 mg/dL (ref >39.00)
Total CHOL/HDL Ratio: 4
VLDL: 44.2 mg/dL — ABNORMAL HIGH (ref 0.0–40.0)

## 2012-05-01 LAB — CBC WITH DIFFERENTIAL/PLATELET
Basophils Absolute: 0.1 10*3/uL (ref 0.0–0.1)
Hemoglobin: 12.5 g/dL (ref 12.0–15.0)
Lymphocytes Relative: 42 % (ref 12.0–46.0)
Monocytes Relative: 8.1 % (ref 3.0–12.0)
Neutro Abs: 4.1 10*3/uL (ref 1.4–7.7)
Neutrophils Relative %: 45.6 % (ref 43.0–77.0)
RDW: 15.3 % — ABNORMAL HIGH (ref 11.5–14.6)

## 2012-05-01 LAB — LDL CHOLESTEROL, DIRECT: Direct LDL: 150.4 mg/dL

## 2012-05-03 ENCOUNTER — Telehealth: Payer: Self-pay | Admitting: Family Medicine

## 2012-05-03 ENCOUNTER — Ambulatory Visit (INDEPENDENT_AMBULATORY_CARE_PROVIDER_SITE_OTHER): Payer: Medicare Other | Admitting: Family Medicine

## 2012-05-03 ENCOUNTER — Encounter: Payer: Self-pay | Admitting: Family Medicine

## 2012-05-03 ENCOUNTER — Other Ambulatory Visit: Payer: Self-pay | Admitting: Oncology

## 2012-05-03 VITALS — BP 140/90 | HR 68 | Temp 98.0°F | Wt 154.0 lb

## 2012-05-03 DIAGNOSIS — R591 Generalized enlarged lymph nodes: Secondary | ICD-10-CM

## 2012-05-03 DIAGNOSIS — R590 Localized enlarged lymph nodes: Secondary | ICD-10-CM

## 2012-05-03 DIAGNOSIS — R599 Enlarged lymph nodes, unspecified: Secondary | ICD-10-CM

## 2012-05-03 MED ORDER — AMOXICILLIN-POT CLAVULANATE 875-125 MG PO TABS: 1.0000 | ORAL_TABLET | Freq: Two times a day (BID) | ORAL | Status: DC

## 2012-05-03 NOTE — Progress Notes (Signed)
Subjective:    Patient ID: Amanda Kane, female    DOB: [birth year], 71 y.o.   MRN: [id number]  HPI  71 yo female with h/o IC, fibromyalgia here for ?cervical lymphadenopathy.  Over a week of painful lymph node behind right ear.  Started out size of a pea, now the size of a quarter.  No recent URI symptoms- no ear pain, no runny nose, no scalp lesions.  CBC normal this week.  PMH significant for mesenteric lymphadenopathy discovered on CT of abd/pelvis. Referred to oncology, reviewed Dr. Lodema Pilot note from 02/2012.  Pt and Dr. Gaylyn Rong agree to watchful waiting and she is scheduled to have follow up CT scan in April 2014.   Lab Results  Component Value Date   WBC 9.0 05/01/2012   HGB 12.5 05/01/2012   HCT 38.0 05/01/2012   MCV 85.8 05/01/2012   PLT 344.0 05/01/2012     Patient Active Problem List  Diagnosis  . MALIGNANT NEOPLASM OF COLON UNSPECIFIED SITE  . COLONIC POLYPS  . HYPOTHYROIDISM NOS  . UNSPECIFIED VITAMIN D DEFICIENCY  . HYPERCHOLESTEROLEMIA, PURE  . HYPERLIPIDEMIA  . OTITIS EXTERNA  . HEARING LOSS  . ESOPHAGITIS, MILD  . ESOPHAGEAL STRICTURE  . GERD  . GASTROPARESIS  . DIVERTICULOSIS-COLON  . DIVERTICULITIS, COLON  . UTI  . BREAST MASS, RIGHT  . ARTHRITIS  . BACK PAIN, LUMBAR  . FIBROMYALGIA  . OSTEOPOROSIS  . COUGH  . DYSPHAGIA UNSPECIFIED  . DYSPHAGIA  . DYSURIA, CHRONIC  . MAMMOGRAM, ABNORMAL, LEFT  . PERSONAL HX COLONIC POLYPS  . Interstitial cystitis  . URI (upper respiratory infection)  . Mesenteric lymphadenopathy  . Conjunctivitis  . Elevated blood pressure  . Chest pain  . Hypertension  . Adenopathy   Past Medical History  Diagnosis Date  . Arthritis   . Esophageal reflux   . Myalgia and myositis, unspecified   . Pure hypercholesterolemia   . Unspecified hypothyroidism   . Colonic polyp     last one around 2008.  Due one now.   . Interstitial cystitis   . Hypertension   . PUD (peptic ulcer disease)   . MVP (mitral valve prolapse)    . Adenopathy 02/25/2012  . Diverticulosis     past diverticulitis; last 2 years ago. no resection.   . Fibromyalgia    Past Surgical History  Procedure Date  . Hysteroscopy w/d&c     Dove  . Appendectomy   . Inner ear surgery   . Breast lumpectomy     bilateral; benign  . Tubal ligation   . Eye surgery     cateracts  . Tonsillectomy   . Left knee surgery    History  Substance Use Topics  . Smoking status: Never Smoker   . Smokeless tobacco: Not on file  . Alcohol Use: Yes     Comment: Glass wine per night   Family History  Problem Relation Age of Onset  . Coronary artery disease Brother     MI at age 19  . Heart failure Mother   . Stroke Father    Allergies  Allergen Reactions  . Atorvastatin   . Celecoxib     REACTION: Itch  . Ciprofloxacin   . Ezetimibe     REACTION: Aches  . Lisinopril Cough  . Macrobid (Nitrofurantoin Macrocrystal)   . Metronidazole   . Pregabalin     REACTION: Numb  . Simvastatin     REACTION: Aches  . Sulfonamide Derivatives  Current Outpatient Prescriptions on File Prior to Visit  Medication Sig Dispense Refill  . albuterol (PROVENTIL HFA;VENTOLIN HFA) 108 (90 BASE) MCG/ACT inhaler Inhale 2 puffs into the lungs every 6 (six) hours as needed for wheezing.  1 Inhaler  0  . amitriptyline (ELAVIL) 10 MG tablet TAKE 1 TABLET (10 MG TOTAL) BY MOUTH AT BEDTIME.  30 tablet  0  . amLODipine (NORVASC) 5 MG tablet Take 1 tablet (5 mg total) by mouth daily.  90 tablet  3  . Bepotastine Besilate 1.5 % SOLN 1 drop into affected eye daily.  1 Bottle  0  . cephALEXin (KEFLEX) 500 MG capsule Take 1 capsule (500 mg total) by mouth 2 (two) times daily.  14 capsule  0  . cyclobenzaprine (FLEXERIL) 5 MG tablet Take one by mouth at bedtime as needed. Take one by mouth at bedtime as needed.  30 tablet  3  . Famotidine (PEPCID PO) Take by mouth as needed.      Marland Kitchen HYDROcodone-acetaminophen (NORCO) 5-325 MG per tablet Take 1 tablet by mouth every 4 (four)  hours as needed.      Marland Kitchen lisinopril (PRINIVIL,ZESTRIL) 20 MG tablet TAKE 1 TABLET (20 MG TOTAL) BY MOUTH DAILY.  30 tablet  6  . phenazopyridine (PYRIDIUM) 100 MG tablet Take 1 tablet (100 mg total) by mouth 3 (three) times daily as needed.  90 tablet  11  . SYNTHROID 75 MCG tablet Take 1 tablet (75 mcg total) by mouth daily.  90 tablet  1   The PMH, PSH, Social History, Family History, Medications, and allergies have been reviewed in [facility], and have been updated if relevant.     Review of Systems See HPI Does endorse increased fatigue     Objective:   Physical Exam BP 140/90  Pulse 68  Temp 98 F (36.7 C)  Wt 154 lb (69.854 kg)  General:  Well-developed,well-nourished,in no acute distress; alert,appropriate and cooperative throughout examination Head:  normocephalic and atraumatic.   Ears:  TMS clear bilaterally Lymph:  Tender, enlarged right post auricular lymph node, not freely movable. No other lymphadenopathy appreciated on exam Lungs:  Normal respiratory effort, chest expands symmetrically. Lungs are clear to auscultation, no crackles or wheezes. Heart:  Normal rate and regular rhythm. S1 and S2 normal without gallop, murmur, click, rub or other extra sounds. Msk:  No deformity or scoliosis noted of thoracic or lumbar spine.   Extremities:  No clubbing, cyanosis, edema, or deformity noted with normal full range of motion of all joints.   Neurologic:  alert & oriented X3 and gait normal.   Skin:  Intact without suspicious lesions or rashes Psych:  Cognition and judgment appear intact. Alert and cooperative with normal attention span and concentration. No apparent delusions, illusions, hallucinations     Assessment & Plan:    1. Adenopathy   Deteriorated. No indication of infection and CBC reassuring.   Will start Augmentin for lymphadenitis and refer back to oncology.  At this point, may need biopsy and or PET scan. The patient indicates understanding of these issues and  agrees with the plan.

## 2012-05-03 NOTE — Telephone Encounter (Signed)
[facility] Lodema Pilot nurse to help make followup appt with Dr Gaylyn Rong. Dr Gaylyn Rong just called here directly to speak to ypu about Ms Lamprecht. He said that if you are concerned about the lymphadenopathy that you should refer her to an ENT for a Biopsy B/C he would do the same thing and he doesn't do Biopsies and it would save her a trip and a co-pay. He recommends a referral to ENT.

## 2012-05-03 NOTE — Patient Instructions (Addendum)
Good to see you. Please stop by to see Amanda Kane on your way out to set up your appointment with oncology.

## 2012-05-04 ENCOUNTER — Other Ambulatory Visit: Payer: Medicare Other

## 2012-05-04 NOTE — Telephone Encounter (Signed)
ENT appt made with Dr Jenne Campus for today, 05/04/12 at 2:30pm, patient aware and records faxed along with Dr Lodema Pilot office note.

## 2012-05-04 NOTE — Telephone Encounter (Signed)
Noted. Thank you.  I will place ENT referral.

## 2012-05-05 ENCOUNTER — Telehealth: Payer: Self-pay | Admitting: Family Medicine

## 2012-05-05 NOTE — Telephone Encounter (Signed)
[facility] called stating pt was put on Augmentin 875mg  and is now having HA, nausea, and diarrhea.  She wants to know if you can change abx.  Please advise.

## 2012-05-05 NOTE — Telephone Encounter (Signed)
Advised patient.  She doesn't see her ENT until 3 weeks from now.

## 2012-05-05 NOTE — Telephone Encounter (Signed)
Ok to stop antibiotics and follow up with ENT as planned.  She has multiple drug allergies and infection was low on our differential when I saw her.

## 2012-05-15 ENCOUNTER — Telehealth: Payer: Self-pay | Admitting: Family Medicine

## 2012-05-15 NOTE — Telephone Encounter (Signed)
Nurse visit scheduled for tdap and flu, per husband's request.

## 2012-05-15 NOTE — Telephone Encounter (Signed)
Yes ok to schedule.

## 2012-05-15 NOTE — Telephone Encounter (Signed)
Pt's husband called and wants to schedule a flu shot and Tdap inj for pt. They have a new grandchild and the mother of the baby is asking that they have the Tdap before they see the baby. Is it ok to to schedule for the Tdap? Thank you.

## 2012-05-24 ENCOUNTER — Ambulatory Visit (INDEPENDENT_AMBULATORY_CARE_PROVIDER_SITE_OTHER): Payer: Medicare HMO | Admitting: *Deleted

## 2012-05-24 DIAGNOSIS — Z23 Encounter for immunization: Secondary | ICD-10-CM

## 2012-06-15 ENCOUNTER — Other Ambulatory Visit: Payer: Self-pay | Admitting: *Deleted

## 2012-06-15 ENCOUNTER — Ambulatory Visit (INDEPENDENT_AMBULATORY_CARE_PROVIDER_SITE_OTHER): Payer: Medicare HMO | Admitting: Family Medicine

## 2012-06-15 ENCOUNTER — Encounter: Payer: Self-pay | Admitting: Family Medicine

## 2012-06-15 VITALS — BP 120/64 | HR 72 | Temp 98.2°F | Ht 62.0 in | Wt 155.2 lb

## 2012-06-15 DIAGNOSIS — R300 Dysuria: Secondary | ICD-10-CM

## 2012-06-15 DIAGNOSIS — N390 Urinary tract infection, site not specified: Secondary | ICD-10-CM

## 2012-06-15 LAB — POCT URINALYSIS DIPSTICK
Blood, UA: NEGATIVE
Ketones, UA: NEGATIVE
Protein, UA: NEGATIVE
Spec Grav, UA: 1.025
pH, UA: 6

## 2012-06-15 MED ORDER — CEPHALEXIN 500 MG PO CAPS: 500.0000 mg | ORAL_CAPSULE | Freq: Three times a day (TID) | ORAL | Status: DC

## 2012-06-15 MED ORDER — FLUOXETINE HCL 20 MG PO CAPS: 20.0000 mg | ORAL_CAPSULE | Freq: Every day | ORAL | Status: DC

## 2012-06-15 NOTE — Patient Instructions (Signed)
Push fluids, rest. Complete course of keflex. We will call with culture results. Follow up with Dr. Dayton Martes if symptoms are not improving as expected in 7 days, call sooner if fever >101.4 on antibiotics or vomiting and not able to keep antibiotics down.

## 2012-06-15 NOTE — Telephone Encounter (Signed)
Please call pt to get more information about her request for vaginal estrogen cream.

## 2012-06-15 NOTE — Telephone Encounter (Signed)
Pt states Dr. Celso Amy had prescribed low dose estrace cream for her to use.  States she uses one gram weekly.  Uses [facility].

## 2012-06-15 NOTE — Telephone Encounter (Signed)
Also request vaginal estrogen creme but not on list.

## 2012-06-15 NOTE — Telephone Encounter (Signed)
I do not prescribe estrogen cream in women who have not had hysterectomies.  Without progesterone, it can increase the risk of uterine cancer.  I would have her call Dr. McDiarmid for refill but I would only take it with progesterone.

## 2012-06-15 NOTE — Telephone Encounter (Signed)
See note below.  Strength on estrace is .01%.

## 2012-06-15 NOTE — Telephone Encounter (Signed)
Patient is asking if there is something else that she can use in place of estrogen.  Per Dr. Dayton Martes, advised patient that she will need office visit to discuss.  Pt declined visit at this time, said she will discuss this the next time she comes in.

## 2012-06-15 NOTE — Assessment & Plan Note (Addendum)
UA today is clear, but she has taken nitrofurantoin treatment dose x 1 day.  HAs history of IC which could be contributing to severity of symptoms.  Will send for culture to verify infection  She has SE typically with nitrofurantoin so we will stop this and treat with keflex ( has multiple allegies/intolerances)

## 2012-06-15 NOTE — Progress Notes (Signed)
  Subjective:    Patient ID: Amanda Kane, female    DOB: 1941-02-11, 72 y.o.   MRN: [id number]  Urinary Tract Infection  This is a new (last UTI 02/2012, Cx showed pansensitive ecoli) problem. The current episode started 1 to 4 weeks ago. The problem has been gradually worsening. The quality of the pain is described as burning. The pain is moderate. There has been no fever (subjective in last few weeks). She is not sexually active. There is a history of pyelonephritis. Associated symptoms include chills, frequency, sweats and urgency. Pertinent negatives include no discharge, flank pain, hematuria, nausea, possible pregnancy or vomiting. She has tried increased fluids (pyridium, started yesterday macrodantin yesterday from Dr. Perley Jain .. took 200 mg BID yesterday.) for the symptoms. The treatment provided moderate relief. Her past medical history is significant for recurrent UTIs. There is no history of catheterization, kidney stones, a single kidney, urinary stasis or a urological procedure. Ptreviously been on macrobid prophylaxsis so stopped taking  6 months ago.   Has history of interstitail cystitis... but elmiron did not help with this.     She has multiple abx allergies-  Including cipro, sulfa, and macrobid.  In past she has responded to keflex.   Review of Systems  Constitutional: Positive for chills.  Gastrointestinal: Negative for nausea and vomiting.  Genitourinary: Positive for urgency and frequency. Negative for hematuria and flank pain.       Objective:   Physical Exam  Constitutional: Vital signs are normal. She appears well-developed and well-nourished. She is cooperative.  Non-toxic appearance. She does not appear ill. No distress.  HENT:  Head: Normocephalic.  Right Ear: Hearing, tympanic membrane, external ear and ear canal normal. Tympanic membrane is not erythematous, not retracted and not bulging.  Left Ear: Hearing, tympanic membrane, external ear and ear canal  normal. Tympanic membrane is not erythematous, not retracted and not bulging.  Nose: No mucosal edema or rhinorrhea. Right sinus exhibits no maxillary sinus tenderness and no frontal sinus tenderness. Left sinus exhibits no maxillary sinus tenderness and no frontal sinus tenderness.  Mouth/Throat: Uvula is midline, oropharynx is clear and moist and mucous membranes are normal.  Eyes: Conjunctivae normal, EOM and lids are normal. Pupils are equal, round, and reactive to light. No foreign bodies found.  Neck: Trachea normal and normal range of motion. Neck supple. Carotid bruit is not present. No mass and no thyromegaly present.  Cardiovascular: Normal rate, regular rhythm, S1 normal, S2 normal, normal heart sounds, intact distal pulses and normal pulses.  Exam reveals no gallop and no friction rub.   No murmur heard. Pulmonary/Chest: Effort normal and breath sounds normal. Not tachypneic. No respiratory distress. She has no decreased breath sounds. She has no wheezes. She has no rhonchi. She has no rales.  Abdominal: Soft. Normal appearance and bowel sounds are normal. There is no hepatosplenomegaly. There is tenderness in the right lower quadrant, suprapubic area and left lower quadrant. There is CVA tenderness. There is no rigidity, no rebound, no guarding and negative Murphy's sign.  Neurological: She is alert.  Skin: Skin is warm, dry and intact. No rash noted.  Psychiatric: Her speech is normal and behavior is normal. Judgment and thought content normal. Her mood appears not anxious. Cognition and memory are normal. She does not exhibit a depressed mood.          Assessment & Plan:

## 2012-06-17 LAB — URINE CULTURE: Colony Count: NO GROWTH

## 2012-08-07 ENCOUNTER — Other Ambulatory Visit: Payer: Self-pay | Admitting: Family Medicine

## 2012-08-30 ENCOUNTER — Ambulatory Visit (HOSPITAL_COMMUNITY)
Admission: RE | Admit: 2012-08-30 | Discharge: 2012-08-30 | Disposition: A | Discharge status: Home / Self Care | Payer: Medicare HMO | Source: Ambulatory Visit | Attending: Oncology | Admitting: Oncology

## 2012-08-30 ENCOUNTER — Other Ambulatory Visit (HOSPITAL_BASED_OUTPATIENT_CLINIC_OR_DEPARTMENT_OTHER): Payer: Medicare Other | Admitting: Lab

## 2012-08-30 ENCOUNTER — Encounter (HOSPITAL_COMMUNITY): Payer: Self-pay

## 2012-08-30 DIAGNOSIS — K8020 Calculus of gallbladder without cholecystitis without obstruction: Secondary | ICD-10-CM | POA: Insufficient documentation

## 2012-08-30 DIAGNOSIS — I708 Atherosclerosis of other arteries: Secondary | ICD-10-CM | POA: Insufficient documentation

## 2012-08-30 DIAGNOSIS — I700 Atherosclerosis of aorta: Secondary | ICD-10-CM | POA: Insufficient documentation

## 2012-08-30 DIAGNOSIS — Z9089 Acquired absence of other organs: Secondary | ICD-10-CM | POA: Insufficient documentation

## 2012-08-30 DIAGNOSIS — D739 Disease of spleen, unspecified: Secondary | ICD-10-CM | POA: Insufficient documentation

## 2012-08-30 DIAGNOSIS — R1084 Generalized abdominal pain: Secondary | ICD-10-CM | POA: Insufficient documentation

## 2012-08-30 DIAGNOSIS — K7689 Other specified diseases of liver: Secondary | ICD-10-CM | POA: Insufficient documentation

## 2012-08-30 DIAGNOSIS — R591 Generalized enlarged lymph nodes: Secondary | ICD-10-CM

## 2012-08-30 DIAGNOSIS — R599 Enlarged lymph nodes, unspecified: Secondary | ICD-10-CM

## 2012-08-30 DIAGNOSIS — K5900 Constipation, unspecified: Secondary | ICD-10-CM | POA: Insufficient documentation

## 2012-08-30 LAB — CBC WITH DIFFERENTIAL/PLATELET
BASO%: 1.3 % (ref 0.0–2.0)
EOS%: 3.3 % (ref 0.0–7.0)
HCT: 38.5 % (ref 34.8–46.6)
LYMPH%: 42.8 % (ref 14.0–49.7)
MCH: 25.9 pg (ref 25.1–34.0)
MCHC: 31.6 g/dL (ref 31.5–36.0)
MCV: 82 fL (ref 79.5–101.0)
MONO#: 0.9 10*3/uL (ref 0.1–0.9)
MONO%: 10.7 % (ref 0.0–14.0)
NEUT%: 41.9 % (ref 38.4–76.8)
Platelets: 291 10*3/uL (ref 145–400)
RBC: 4.7 10*6/uL (ref 3.70–5.45)

## 2012-08-30 LAB — LACTATE DEHYDROGENASE (CC13): LDH: 214 U/L (ref 125–245)

## 2012-08-30 LAB — COMPREHENSIVE METABOLIC PANEL (CC13)
ALT: 21 U/L (ref 0–55)
Alkaline Phosphatase: 86 U/L (ref 40–150)
CO2: 27 mEq/L (ref 22–29)
Creatinine: 0.8 mg/dL (ref 0.6–1.1)
Total Bilirubin: 0.44 mg/dL (ref 0.20–1.20)

## 2012-08-30 MED ORDER — IOHEXOL 300 MG/ML  SOLN
100.0000 mL | Freq: Once | INTRAMUSCULAR | Status: AC | PRN
  Administered 2012-08-30: 100 mL via INTRAVENOUS

## 2012-09-01 ENCOUNTER — Telehealth: Payer: Self-pay | Admitting: Oncology

## 2012-09-01 ENCOUNTER — Ambulatory Visit (HOSPITAL_BASED_OUTPATIENT_CLINIC_OR_DEPARTMENT_OTHER): Payer: Medicare Other | Admitting: Lab

## 2012-09-01 ENCOUNTER — Encounter: Payer: Self-pay | Admitting: Oncology

## 2012-09-01 ENCOUNTER — Ambulatory Visit (HOSPITAL_BASED_OUTPATIENT_CLINIC_OR_DEPARTMENT_OTHER): Payer: Medicare Other | Admitting: Oncology

## 2012-09-01 VITALS — BP 142/73 | HR 81 | Temp 97.0°F | Resp 20 | Ht 62.0 in | Wt 155.5 lb

## 2012-09-01 DIAGNOSIS — R590 Localized enlarged lymph nodes: Secondary | ICD-10-CM

## 2012-09-01 DIAGNOSIS — R599 Enlarged lymph nodes, unspecified: Secondary | ICD-10-CM

## 2012-09-01 HISTORY — DX: Localized enlarged lymph nodes: R59.0

## 2012-09-01 NOTE — Patient Instructions (Addendum)
1. Issue: Abdominal lymph nodes. 2. Status: Progression with followup CT scan. 3. Recommendation: Referral to general surgery with [facility] for evaluation for biopsy for tissue diagnosis. Most likely, this is an indolent non-Hodgkin lymphoma. Another possibility is sarcoidosis which is a noncancerous chronic inflammatory condition. 4. Followup: In about 3 weeks to discuss diagnosis and treatment as appropriate.

## 2012-09-01 NOTE — Progress Notes (Signed)
[facility]  [phone] [phone]   OFFICE PROGRESS NOTE   Cc:  Ruthe Mannan, MD  DIAGNOSIS:  Diffuse abdominal adenopathy; workup pending   CURRENT THERAPY:  watchful observation   INTERVAL HISTORY: Amanda Kane 72 y.o. female returns for regular followup with her husband. She reports that she is been having diffuse mild abdominal cramps tonight. She feels that all of her nerves comes out of her body at night when she sleeps resulting in diffuse numbness and tingling in all 4 extremities. She has also intermittent discomfort in the abdomen during the daytime which she attributes to her intermittent diverticulitis. She denies fever, diarrhea, melena, hematochezia, vaginal bleeding. The rest of the 14 point review of system was negative.    Past Medical History  Diagnosis Date  . Arthritis   . Esophageal reflux   . Myalgia and myositis, unspecified   . Pure hypercholesterolemia   . Unspecified hypothyroidism   . Colonic polyp     last one around 2008.  Due one now.   . Interstitial cystitis   . Hypertension   . PUD (peptic ulcer disease)   . MVP (mitral valve prolapse)   . Adenopathy 02/25/2012  . Diverticulosis     past diverticulitis; last 2 years ago. no resection.   . Fibromyalgia   . Lymphadenopathy, abdominal 09/01/2012    Past Surgical History  Procedure Laterality Date  . Hysteroscopy w/d&c      Dove  . Appendectomy    . Inner ear surgery    . Breast lumpectomy      bilateral; benign  . Tubal ligation    . Eye surgery      cateracts  . Tonsillectomy    . Left knee surgery      Current Outpatient Prescriptions  Medication Sig Dispense Refill  . amitriptyline (ELAVIL) 10 MG tablet TAKE 1 TABLET (10 MG TOTAL) BY MOUTH AT BEDTIME.  30 tablet  0  . amLODipine (NORVASC) 5 MG tablet Take 1 tablet (5 mg total) by mouth daily.  90 tablet  3  . Bepotastine Besilate 1.5 % SOLN 1 drop into affected eye daily.  1 Bottle  0  .  cyclobenzaprine (FLEXERIL) 5 MG tablet Take one by mouth at bedtime as needed. Take one by mouth at bedtime as needed.  30 tablet  3  . Famotidine (PEPCID PO) Take by mouth as needed.      Marland Kitchen HYDROcodone-acetaminophen (NORCO) 5-325 MG per tablet Take 1 tablet by mouth every 4 (four) hours as needed.      . phenazopyridine (PYRIDIUM) 100 MG tablet Take 1 tablet (100 mg total) by mouth 3 (three) times daily as needed.  90 tablet  11  . SYNTHROID 75 MCG tablet TAKE 1 TABLET (75 MCG TOTAL) BY MOUTH DAILY.  90 tablet  1  . traMADol (ULTRAM) 50 MG tablet Take 50 mg by mouth every 8 (eight) hours as needed for pain.       No current facility-administered medications for this visit.    ALLERGIES:  is allergic to atorvastatin; celecoxib; ciprofloxacin; ezetimibe; lisinopril; macrobid; metronidazole; pregabalin; simvastatin; and sulfonamide derivatives.  REVIEW OF SYSTEMS:  The rest of the 14-point review of system was negative.   Filed Vitals:   09/01/12 1022  BP: 142/73  Pulse: 81  Temp: 97 F (36.1 C)  Resp: 20   Wt Readings from Last 3 Encounters:  09/01/12 155 lb 8 oz (70.534 kg)  06/15/12 155 lb  4 oz (70.421 kg)  05/03/12 154 lb (69.854 kg)   ECOG Performance status: 1  PHYSICAL EXAMINATION:   General:  well-nourished  woman, in no acute distress.  Eyes:  no scleral icterus.  ENT:  There were no oropharyngeal lesions.  Neck was without thyromegaly.  Lymphatics:  Negative cervical, supraclavicular or axillary adenopathy.  Respiratory: lungs were clear bilaterally without wheezing or crackles.  Cardiovascular:  Regular rate and rhythm, S1/S2, without murmur, rub or gallop.  There was no pedal edema.  GI:  abdomen was soft, flat, nontender, nondistended, without organomegaly.  Muscoloskeletal:  no spinal tenderness of palpation of vertebral spine.  Skin exam was without echymosis, petichae.  Neuro exam was nonfocal.  Patient was able to get on and off exam table without assistance.  Gait was  normal.  Patient was alert and oriented.  Attention was good.   Language was appropriate.  Mood was normal without depression.  Speech was not pressured.  Thought content was not tangential.         LABORATORY/RADIOLOGY DATA:  Lab Results  Component Value Date   WBC 8.8 08/30/2012   HGB 12.2 08/30/2012   HCT 38.5 08/30/2012   PLT 291 08/30/2012   GLUCOSE 100* 08/30/2012   CHOL 232* 05/01/2012   TRIG 221.0* 05/01/2012   HDL 52.80 05/01/2012   LDLDIRECT 150.4 05/01/2012   ALKPHOS 86 08/30/2012   ALT 21 08/30/2012   AST 21 08/30/2012   NA 142 08/30/2012   K 4.5 08/30/2012   CL 106 08/30/2012   CREATININE 0.8 08/30/2012   BUN 13.2 08/30/2012   CO2 27 08/30/2012   Radiology: I personally reviewed the following CT scan and showed the pictures the patient and husband.   Ct Abdomen Pelvis W Contrast  08/30/2012  *RADIOLOGY REPORT*  Clinical Data: Diffuse abdominal pain with constipation.  Follow-up adenopathy - question of lymphoma.  History appendectomy.  CT ABDOMEN AND PELVIS WITH CONTRAST  Technique:  Multidetector CT imaging of the abdomen and pelvis was performed following the standard protocol during bolus administration of intravenous contrast.  Contrast: OMNIPAQUE IOHEXOL 300 MG/ML  SOLN  Comparison: Prior studies ranging from 08/29/2007 through 02/25/2012.  Findings: Minimal subpleural nodularity in the right lower lobe on image number two is stable.  The lung bases are otherwise clear. There is no pleural effusion.  The spleen remains normal in size.  Ill-defined low density lesion centrally on image number 14 is unchanged from 11/24/2011.  There are no new splenic lesions.  The liver, pancreas, adrenal glands and kidneys appear stable; there is a tiny cyst in the liver adjacent to the gallbladder.  There are several noncalcified gallstones within the gallbladder lumen.  There is no gallbladder wall thickening or pericholecystic fluid.  Again demonstrated are multiple enlarged lymph nodes  within the small bowel and ileocolonic mesentery.  Although comparison of the individual lymph nodes is difficult due to the movement of the small bowel mesentery, these nodes have further increased in both size and number, best seen on the coronal images. Small lymph nodes within the porta hepatis and retroperitoneum are stable.  There are no enlarged pelvic or inguinal lymph nodes.  The uterus, ovaries and bladder appear normal.  Mild sigmoid colon diverticular changes are stable without surrounding inflammation. There is stable aorto iliac atherosclerosis.  There are no worrisome osseous lesions.  IMPRESSION:  1.  Further progression in mesenteric lymphadenopathy remaining concerning for lymphoma or another lymphoproliferative process. 2.  Stable appearance of the  spleen with small indeterminate low density lesion centrally.  No significant splenomegaly. 3.  Cholelithiasis without evidence of cholecystitis or biliary dilatation.   Original Report Authenticated By: Carey Bullocks, M.D.      ASSESSMENT AND PLAN:    1. Issue: Abdominal lymph nodes. 2. Status: Progression with followup CT scan. 3. Differential: Most likely, this is an indolent non-Hodgkin lymphoma. Another possibility is sarcoidosis which is a noncancerous chronic inflammatory condition.  It is unlikely to be due to diverticulitis at this any abscess to account for the diffuse adenopathy. 4. Recommendation: Referral to general surgery with [facility] group for evaluation for biopsy for tissue diagnosis.  She agreed with the recommendation would like to see surgery 5. Followup: In about 3-4 weeks to discuss diagnosis and treatment as appropriate.    The length of time of the face-to-face encounter was 25 minutes. More than 50% of time was spent counseling and coordination of care.

## 2012-09-01 NOTE — Telephone Encounter (Signed)
Pt sent back to lab and given appt schedule for May. Pt aware I will call her w/appt @ [facility] - [facility] closed.

## 2012-09-04 ENCOUNTER — Telehealth: Payer: Self-pay | Admitting: *Deleted

## 2012-09-04 NOTE — Telephone Encounter (Signed)
Pt left VM to f/u if appt has been scheduled w/ Surgeon at [facility]?   Left VM for Dory Peru in scheduling to follow up.

## 2012-09-05 ENCOUNTER — Telehealth: Payer: Self-pay | Admitting: Oncology

## 2012-09-05 NOTE — Telephone Encounter (Signed)
S/w pt re appt 5/1 @ 9:15am w/Dr. Derrell Lolling.

## 2012-09-14 ENCOUNTER — Telehealth (INDEPENDENT_AMBULATORY_CARE_PROVIDER_SITE_OTHER): Payer: Self-pay | Admitting: General Surgery

## 2012-09-14 ENCOUNTER — Ambulatory Visit (INDEPENDENT_AMBULATORY_CARE_PROVIDER_SITE_OTHER): Payer: Medicare HMO | Admitting: General Surgery

## 2012-09-14 ENCOUNTER — Encounter: Payer: Self-pay | Admitting: Gastroenterology

## 2012-09-14 ENCOUNTER — Encounter (INDEPENDENT_AMBULATORY_CARE_PROVIDER_SITE_OTHER): Payer: Self-pay | Admitting: General Surgery

## 2012-09-14 ENCOUNTER — Telehealth: Payer: Self-pay | Admitting: *Deleted

## 2012-09-14 ENCOUNTER — Telehealth: Payer: Self-pay | Admitting: Gastroenterology

## 2012-09-14 VITALS — BP 128/90 | HR 74 | Resp 18 | Ht 62.5 in | Wt 155.6 lb

## 2012-09-14 DIAGNOSIS — R599 Enlarged lymph nodes, unspecified: Secondary | ICD-10-CM

## 2012-09-14 DIAGNOSIS — R590 Localized enlarged lymph nodes: Secondary | ICD-10-CM

## 2012-09-14 DIAGNOSIS — K8020 Calculus of gallbladder without cholecystitis without obstruction: Secondary | ICD-10-CM

## 2012-09-14 NOTE — Telephone Encounter (Signed)
Pt requesting a sooner appt with Dr. Arlyce Dice. Scheduled pt to see Dr. Arlyce Dice 09/18/12@10 :30am. Pt aware of appt date and time.

## 2012-09-14 NOTE — Telephone Encounter (Signed)
Pt left VM states she has appt to see Dr. Arlyce Dice on 5/05 and appt for some type of Nuc Med test for her Gall Bladder on 5/14 (ordered by Dr. Derrell Lolling).  She wants to know if she is supposed to keep her appt w/ Dr. Gaylyn Rong on 5/09 or should that visit be delayed until after she has Colonoscopy and Gall Bladder procedures done?

## 2012-09-14 NOTE — Progress Notes (Signed)
Patient ID: Amanda Kane, female   DOB: 1940-07-28, 72 y.o.   MRN: [id number]  No chief complaint on file.   HPI Amanda Kane is a 72 y.o. female.  She is referred to me by Dr. Jethro Bolus, for evaluation of abdominal mesenteric adenopathy. She also has abdominal pain. Dr. Enos Fling is her primary care physician. Dr. McDiarmid is her urologist.   This patient has a long history of adenomatous colon polyps. She states that she had a malignant polyp removed in 1993 but did not require surgical intervention. She has been followed more recently Dr. Melvia Heaps. Last colonoscopy 12/26/2008 showed left sided diverticulosis and a 2 mm adenomatous polyp. She's been treated as an outpatient for diverticulitis takes Metamucil daily her bowels are regulated. She also has an esophageal stricture has been dilated in the past.  She was found to have some mesenteric adenopathy 2 or 3 years ago on CT scan. Pelvic ultrasound was normal. Recent CT scan of the abdomen and pelvis on 08/30/2012 shows gallstones but no gallbladder inflammation. There is further progression and mesenteric lymphadenopathy in the small bowel mesentery, mostly distal small bowel mesentery and ileocolic area. There are no enlarged pelvic or inguinal lymph nodes. Uterus ovaries and bladder looked normal. Diverticular changes of the colon noted. She has been followed for this adenopathy Dr. Gaylyn Rong for a year. He now referred her to me because of  progression radiographically of the adenopathy..  Associated with this is a history of abdominal pain. She states she has had stomach problems for many years. This used to just be reflux symptoms. She subsequently had outpatient management for diverticulitis. For the past 6 months she notices abdominal fullness and pain under her rib cages. She says she gets epigastric and substernal pain after eating. She is intolerant of fatty foods. No change in her bowel movements. Last colonoscopy was 2010.  ACE level was  normal and so Dr. Gaylyn Rong does not think she has sarcoidosis. HPI  Past Medical History  Diagnosis Date  . Arthritis   . Esophageal reflux   . Myalgia and myositis, unspecified   . Pure hypercholesterolemia   . Unspecified hypothyroidism   . Colonic polyp     last one around 2008.  Due one now.   . Interstitial cystitis   . Hypertension   . PUD (peptic ulcer disease)   . MVP (mitral valve prolapse)   . Adenopathy 02/25/2012  . Diverticulosis     past diverticulitis; last 2 years ago. no resection.   . Fibromyalgia   . Lymphadenopathy, abdominal 09/01/2012    Past Surgical History  Procedure Laterality Date  . Hysteroscopy w/d&c      Dove  . Appendectomy    . Inner ear surgery    . Breast lumpectomy      bilateral; benign  . Tubal ligation    . Eye surgery      cateracts  . Tonsillectomy    . Left knee surgery      Family History  Problem Relation Age of Onset  . Coronary artery disease Brother     MI at age 50  . Heart failure Mother   . Stroke Father     Social History History  Substance Use Topics  . Smoking status: Never Smoker   . Smokeless tobacco: Not on file  . Alcohol Use: Yes     Comment: Glass wine per night    Allergies  Allergen Reactions  . Atorvastatin   .  Celecoxib     REACTION: Itch  . Ciprofloxacin   . Ezetimibe     REACTION: Aches  . Lisinopril Cough  . Macrobid (Nitrofurantoin Macrocrystal)   . Metronidazole   . Pregabalin     REACTION: Numb  . Simvastatin     REACTION: Aches  . Sulfonamide Derivatives     Current Outpatient Prescriptions  Medication Sig Dispense Refill  . amitriptyline (ELAVIL) 10 MG tablet TAKE 1 TABLET (10 MG TOTAL) BY MOUTH AT BEDTIME.  30 tablet  0  . amLODipine (NORVASC) 5 MG tablet Take 1 tablet (5 mg total) by mouth daily.  90 tablet  3  . Bepotastine Besilate 1.5 % SOLN 1 drop into affected eye daily.  1 Bottle  0  . Famotidine (PEPCID PO) Take by mouth as needed.      Marland Kitchen HYDROcodone-acetaminophen  (NORCO) 5-325 MG per tablet Take 1 tablet by mouth every 4 (four) hours as needed.      . phenazopyridine (PYRIDIUM) 100 MG tablet Take 1 tablet (100 mg total) by mouth 3 (three) times daily as needed.  90 tablet  11  . SYNTHROID 75 MCG tablet TAKE 1 TABLET (75 MCG TOTAL) BY MOUTH DAILY.  90 tablet  1  . traMADol (ULTRAM) 50 MG tablet Take 50 mg by mouth every 8 (eight) hours as needed for pain.       No current facility-administered medications for this visit.    Review of Systems Review of Systems  Constitutional: Negative for fever, chills and unexpected weight change.  HENT: Positive for trouble swallowing. Negative for hearing loss, congestion, sore throat and voice change.   Eyes: Negative for visual disturbance.  Respiratory: Negative for cough and wheezing.   Cardiovascular: Negative for chest pain, palpitations and leg swelling.  Gastrointestinal: Positive for nausea, abdominal pain and abdominal distention. Negative for vomiting, diarrhea, constipation, blood in stool, anal bleeding and rectal pain.  Genitourinary: Negative for hematuria, vaginal bleeding and difficulty urinating.  Musculoskeletal: Negative for arthralgias.  Skin: Negative for rash and wound.  Neurological: Negative for seizures, syncope and headaches.  Hematological: Negative for adenopathy. Does not bruise/bleed easily.  Psychiatric/Behavioral: Negative for confusion.    Blood pressure 128/90, pulse 74, resp. rate 18, height 5' 2.5" (1.588 m), weight 155 lb 9.6 oz (70.58 kg).  Physical Exam Physical Exam  Constitutional: She is oriented to person, place, and time. She appears well-developed and well-nourished. No distress.  BMI 28  HENT:  Head: Normocephalic and atraumatic.  Nose: Nose normal.  Mouth/Throat: No oropharyngeal exudate.  Eyes: Conjunctivae and EOM are normal. Pupils are equal, round, and reactive to light. Left eye exhibits no discharge. No scleral icterus.  Neck: Neck supple. No JVD  present. No tracheal deviation present. No thyromegaly present.  Cardiovascular: Normal rate, regular rhythm, normal heart sounds and intact distal pulses.   No murmur heard. Pulmonary/Chest: Effort normal and breath sounds normal. No respiratory distress. She has no wheezes. She has no rales. She exhibits no tenderness.  Abdominal: Soft. Bowel sounds are normal. She exhibits no distension and no mass. There is no tenderness. There is no rebound and no guarding.  Abdomen is soft but she is somewhat tender everywhere. More so in the right and left subcostal areas. Healed right lower quadrant scar. No hernia. No inguinal adenopathy. No organomegaly.  Musculoskeletal: She exhibits no edema and no tenderness.  Lymphadenopathy:    She has no cervical adenopathy.  Neurological: She is alert and oriented to person, place,  and time. She exhibits normal muscle tone. Coordination normal.  Skin: Skin is warm. No rash noted. She is not diaphoretic. No erythema. No pallor.  Psychiatric: She has a normal mood and affect. Her behavior is normal. Judgment and thought content normal.    Data Reviewed I have reviewed Dr. Scharlene Gloss office notes. Reviewed her CT scans findings and her blood work. I reviewed her prior colonoscopy and upper endoscopy reports.  Assessment    Progressive adenopathy of the distal small bowel and ileocolic mesentery. This might be a primary neoplastic process such as lymphoma, and reactive process to chronic diverticulitis, or could be a metastatic from an ileocolic primary neoplasm.  Abdominal pain. The cause for this is unexplained and seems unlikely that her mesenteric adenopathy would cause this. This was complicated because of her esophageal stricture and reflux in the past. She also has gallstones and some of her symptoms do sound like chronic cholecystitis and biliary colic  Gallstones  Interstitial cystitis and Chronic UTIs, followed by Dr.  McDiarmid  Depression  Fibromyalgia  Hypertension  History appendectomy  History  premalignant adenomatous colon polyps  History diverticulitis treated as outpatient   History of appendectomy.    Plan    Prior to any surgical intervention, the patient will be referred to Dr. Melvia Heaps for colonoscopy to make sure that she does not have a primary colorectal neoplasia  [facility] stimulated hepatobiliary scan will be done to assess the possible role that gallstones may be playing in her pain symptoms  Return to see me after these are done and we will eventually need to proceed to surgical intervention. I told her that if her colonoscopy was normal we would consider a laparoscopic lymph node biopsy and possible laparoscopic cholecystectomy.  If we find a primary colon cancer and that would change the whole approach.        Angelia Mould. Derrell Lolling, M.D., [facility] General and Minimally invasive Surgery Breast and Colorectal Surgery Office:   [phone] Pager:   [phone]  09/14/2012, 10:27 AM

## 2012-09-14 NOTE — Patient Instructions (Signed)
Your CT scan shows an enlarging lymph nodes in the mesentery of the small bowel and ileocolic area. There is a concern that this might represent lymphoma or some other neoplastic process.  Your abdominal pain is not well explained, and is probably not related to these enlarged lymph nodes. You do have gallstones and that is one possibility  You will be referred back to Dr. Melvia Heaps for colonoscopy to make sure there is nothing wrong with her colon.  You will be scheduled for a hepatobiliary scan  Return to see Dr. Derrell Lolling after these tests are completed.  Eventually you will need an abdominal operation to clarify the diagnosis, and possibly to remove the gallbladder.

## 2012-09-14 NOTE — Telephone Encounter (Signed)
Spoke with patient she is aware of  appt for cck hida on 09/27/12 at 845am and appt  With Dr Arlyce Dice on 10/05/12 at 10 am

## 2012-09-15 ENCOUNTER — Other Ambulatory Visit: Payer: Self-pay | Admitting: Oncology

## 2012-09-15 ENCOUNTER — Telehealth: Payer: Self-pay | Admitting: Oncology

## 2012-09-18 ENCOUNTER — Encounter: Payer: Self-pay | Admitting: Gastroenterology

## 2012-09-18 ENCOUNTER — Ambulatory Visit (INDEPENDENT_AMBULATORY_CARE_PROVIDER_SITE_OTHER): Payer: Medicare Other | Admitting: Gastroenterology

## 2012-09-18 VITALS — BP 134/80 | HR 76 | Ht 62.5 in | Wt 156.0 lb

## 2012-09-18 DIAGNOSIS — K219 Gastro-esophageal reflux disease without esophagitis: Secondary | ICD-10-CM

## 2012-09-18 DIAGNOSIS — R599 Enlarged lymph nodes, unspecified: Secondary | ICD-10-CM

## 2012-09-18 DIAGNOSIS — R1310 Dysphagia, unspecified: Secondary | ICD-10-CM

## 2012-09-18 DIAGNOSIS — R590 Localized enlarged lymph nodes: Secondary | ICD-10-CM

## 2012-09-18 MED ORDER — DEXLANSOPRAZOLE 60 MG PO CPDR: 60.0000 mg | DELAYED_RELEASE_CAPSULE | Freq: Every day | ORAL | Status: DC

## 2012-09-18 MED ORDER — NA SULFATE-K SULFATE-MG SULF 17.5-3.13-1.6 GM/177ML PO SOLN: 1.0000 | Freq: Once | ORAL | Status: DC

## 2012-09-18 NOTE — Progress Notes (Signed)
History of Present Illness: Pleasant 72 year old white female referred at the request of Dr. Derrell Lolling for evaluation of lymphadenopathy. Patient has a history of an esophageal stricture and adenomatous polyps. In 2010  a 2mm adenomatous polyp was removed. 2010 endoscopy demonstrated an early esophageal stricture which was dilated. The GE junction was irregular but biopsies demonstrated inflammatory changes only.  The patient has had progressive mesenteric lymphadenopathy seen by serial CT scans over the past year. These were incidentally noted when she recently was scanned because of a kidney problem. She complains of fullness in the chest accompanied by pyrosis that is improved with antacids. 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 She takes Pepcid only. She's also complaining of recurrent dysphagia to solids. There has been no change in bowel habits, melena or hematochezia. She has some very nonspecific lower abdominal discomfort.   Past Medical History  Diagnosis Date  . Arthritis   . Esophageal reflux   . Myalgia and myositis, unspecified   . Pure hypercholesterolemia   . Unspecified hypothyroidism   . Colonic polyp     last one around 2008.  Due one now.   . Interstitial cystitis   . Hypertension   . PUD (peptic ulcer disease)   . MVP (mitral valve prolapse)   . Adenopathy 02/25/2012  . Diverticulosis     past diverticulitis; last 2 years ago. no resection.   . Fibromyalgia   . Lymphadenopathy, abdominal 09/01/2012   Past Surgical History  Procedure Laterality Date  . Hysteroscopy w/d&c      [place]  . Appendectomy    . Inner ear surgery    . Breast lumpectomy      bilateral; benign  . Tubal ligation    . Eye surgery      cateracts  . Tonsillectomy    . Left knee surgery     family history includes Coronary artery disease in her brother; Heart failure in her mother; and Stroke in her father. Current Outpatient Prescriptions  Medication Sig  Dispense Refill  . amitriptyline (ELAVIL) 10 MG tablet TAKE 1 TABLET (10 MG TOTAL) BY MOUTH AT BEDTIME.  30 tablet  0  . amLODipine (NORVASC) 5 MG tablet Take 1 tablet (5 mg total) by mouth daily.  90 tablet  3  . Bepotastine Besilate 1.5 % SOLN 1 drop into affected eye daily.  1 Bottle  0  . Famotidine (PEPCID PO) Take by mouth as needed.      Marland Kitchen HYDROcodone-acetaminophen (NORCO) 5-325 MG per tablet Take 1 tablet by mouth every 4 (four) hours as needed.      . phenazopyridine (PYRIDIUM) 100 MG tablet Take 1 tablet (100 mg total) by mouth 3 (three) times daily as needed.  90 tablet  11  . SYNTHROID 75 MCG tablet TAKE 1 TABLET (75 MCG TOTAL) BY MOUTH DAILY.  90 tablet  1  . traMADol (ULTRAM) 50 MG tablet Take 50 mg by mouth every 8 (eight) hours as needed for pain.       No current facility-administered medications for this visit.   Allergies as of 09/18/2012 - Review Complete 09/18/2012  Allergen Reaction Noted  . Atorvastatin    . Celecoxib    . Ciprofloxacin    . Ezetimibe    . Lisinopril Cough 02/11/2012  . Macrobid (nitrofurantoin macrocrystal)  01/26/2012  . Metronidazole    . Pregabalin    . Simvastatin    . Sulfonamide derivatives      reports  that she has never smoked. She has never used smokeless tobacco. She reports that  drinks alcohol. She reports that she does not use illicit drugs.     Review of Systems: She suffers from chronic fatigue. Pertinent positive and negative review of systems were noted in the above HPI section. All other review of systems were otherwise negative.  Vital signs were reviewed in today's medical record Physical Exam: General: Well developed , well nourished, no acute distress Skin: anicteric Head: Normocephalic and atraumatic Eyes:  sclerae anicteric, EOMI Ears: Normal auditory acuity Mouth: No deformity or lesions Neck: Supple, no masses or thyromegaly Lungs: Clear throughout to auscultation Heart: Regular rate and rhythm; no murmurs,  rubs or bruits Abdomen: Soft, non tender and non distended. No masses, hepatosplenomegaly or hernias noted. Normal Bowel sounds Rectal:deferred Musculoskeletal: Symmetrical with no gross deformities  Skin: No lesions on visible extremities Pulses:  Normal pulses noted Extremities: No clubbing, cyanosis, edema or deformities noted Neurological: Alert oriented x 4, grossly nonfocal Cervical Nodes:  No significant cervical adenopathy Inguinal Nodes: No significant inguinal adenopathy Psychological:  Alert and cooperative. Normal mood and affect

## 2012-09-18 NOTE — Assessment & Plan Note (Signed)
The patient has progressive mesenteric lymphadenopathy worrisome for lymphoma. Metastatic disease to the lymph nodes is also a concern. A colon primary should be ruled out.  Recommendations #1 colonoscopy

## 2012-09-18 NOTE — Assessment & Plan Note (Addendum)
Chest fullness and burning may be attributable to GERD. He describes numbness and sharp pains in her feet when she takes omeprazole and feels that this is the cause of her symptoms which do  abate when she stops omeprazole. Although she has gallbladder stones I'm not convinced that they're causing symptoms.  Recommendations #1 trial of dexilant 60 mg daily. I carefully instructed the patient to contact me and to discontinue dexilant should symptoms of peripheral neuropathy recur  #2 antacids when necessary for breakthrough pyrosis #3 HIDA scan if symptoms are not improved with the medical regimen outlined above

## 2012-09-18 NOTE — Patient Instructions (Addendum)
You have been scheduled for a colonoscopy with propofol. Please follow written instructions given to you at your visit today.  Please pick up your prep kit at the pharmacy within the next 1-3 days. If you use inhalers (even only as needed), please bring them with you on the day of your procedure. Your physician has requested that you go to www.startemmi.com and enter the access code given to you at your visit today. This web site gives a general overview about your procedure. However, you should still follow specific instructions given to you by our office regarding your preparation for the procedure.  You have been scheduled for an endoscopy with propofol. Please follow written instructions given to you at your visit today. If you use inhalers (even only as needed), please bring them with you on the day of your procedure. Your physician has requested that you go to www.startemmi.com and enter the access code given to you at your visit today. This web site gives a general overview about your procedure. However, you should still follow specific instructions given to you by our office regarding your preparation for the procedure. 

## 2012-09-18 NOTE — Assessment & Plan Note (Addendum)
This is likely due to a recurrent stricture.  Recommendations #1 upper endoscopy with dilatation as indicated

## 2012-09-21 ENCOUNTER — Telehealth: Payer: Self-pay | Admitting: Gastroenterology

## 2012-09-21 NOTE — Telephone Encounter (Signed)
I gave pt a Moviprep kit in place of Suprep. Also she is picking up samples of Dexilant

## 2012-09-22 ENCOUNTER — Encounter: Payer: Self-pay | Admitting: Gastroenterology

## 2012-09-22 ENCOUNTER — Ambulatory Visit (AMBULATORY_SURGERY_CENTER): Payer: Medicare Other | Admitting: Gastroenterology

## 2012-09-22 ENCOUNTER — Ambulatory Visit: Payer: Medicare HMO | Admitting: Oncology

## 2012-09-22 VITALS — BP 132/74 | HR 77 | Temp 97.3°F | Resp 16 | Ht 62.0 in | Wt 156.0 lb

## 2012-09-22 DIAGNOSIS — D126 Benign neoplasm of colon, unspecified: Secondary | ICD-10-CM

## 2012-09-22 DIAGNOSIS — Z86010 Personal history of colonic polyps: Secondary | ICD-10-CM

## 2012-09-22 DIAGNOSIS — R599 Enlarged lymph nodes, unspecified: Secondary | ICD-10-CM

## 2012-09-22 DIAGNOSIS — K5730 Diverticulosis of large intestine without perforation or abscess without bleeding: Secondary | ICD-10-CM

## 2012-09-22 MED ORDER — SODIUM CHLORIDE 0.9 % IV SOLN: 500.0000 mL | INTRAVENOUS | Status: DC

## 2012-09-22 NOTE — Progress Notes (Signed)
Patient did not experience any of the following events: a burn prior to discharge; a fall within the facility; wrong site/side/patient/procedure/implant event; or a hospital transfer or hospital admission upon discharge from the facility. (G8907) Patient did not have preoperative order for IV antibiotic SSI prophylaxis. (G8918)  

## 2012-09-22 NOTE — Op Note (Signed)
[facility] [address] [place], [address]   COLONOSCOPY PROCEDURE REPORT  PATIENT: Amanda Kane, Amanda Kane  MR#: [id number] BIRTHDATE: [birth year] , 71  yrs. old GENDER: Female ENDOSCOPIST: Louis Meckel, MD REFERRED XB:[id number] Derrell Lolling, M.D. PROCEDURE DATE:  09/22/2012 PROCEDURE:   Colonoscopy with snare polypectomy ASA CLASS:   Class II INDICATIONS:Patient's personal history of adenomatous colon polyps and an abnormal CT - , abdominal lymphadenopathy MEDICATIONS: MAC sedation, administered by CRNA and Propofol (Diprivan) 380 mg IV, glucagon 1 mg IV  DESCRIPTION OF PROCEDURE:   After the risks benefits and alternatives of the procedure were thoroughly explained, informed consent was obtained.  A digital rectal exam revealed no abnormalities of the rectum.   The [facility] [id number]-H180AL [id number]  endoscope was introduced through the anus and advanced to the cecum, which was identified by both the appendix and ileocecal valve. No adverse events experienced.   The quality of the prep was excellent using Suprep  The instrument was then slowly withdrawn as the colon was fully examined.      COLON FINDINGS: A flat polyp measuring 8 mm in size was found at the cecum.  A polypectomy was performed with a cold snare.  The resection was complete and the polyp tissue was completely retrieved.   There was severe diverticulosis noted in the sigmoid colon with associated angulation, muscular hypertrophy, luminal narrowing and colonic spasm.   Mild diverticulosis was noted in the descending colon and transverse colon.   The colon mucosa was otherwise normal.  Retroflexed views revealed no abnormalities. The time to cecum=15 minutes 21 seconds.  Withdrawal time=8 minutes 21 seconds.  The scope was withdrawn and the procedure completed. COMPLICATIONS: There were no complications.  ENDOSCOPIC IMPRESSION: 1.   Flat polyp measuring 8 mm in size was found at the cecum; polypectomy was  performed with a cold snare 2.   There was severe diverticulosis noted in the sigmoid colon 3.   Mild diverticulosis was noted in the descending colon and transverse colon 4.   The colon mucosa was otherwise normal  RECOMMENDATIONS: If the polyp(s) removed today are proven to be adenomatous (pre-cancerous) polyps, you will need a repeat colonoscopy in 5 years.  Otherwise you should continue to follow colorectal cancer screening guidelines for "routine risk" patients with colonoscopy in 10 years.  You will receive a letter within 1-2 weeks with the results of your biopsy as well as final recommendations.  Please call my office if you have not received a letter after 3 weeks.   eSigned:  Louis Meckel, MD 09/22/2012 3:36 PM   cc: Ruthe Mannan MD

## 2012-09-22 NOTE — Progress Notes (Signed)
Called to room to assist during endoscopic procedure.  Patient ID and intended procedure confirmed with present staff. Received instructions for my participation in the procedure from the performing physician. ewm 

## 2012-09-22 NOTE — Patient Instructions (Addendum)
YOU HAD AN ENDOSCOPIC PROCEDURE TODAY AT THE [facility]: Refer to the procedure report that was given to you for any specific questions about what was found during the examination.  If the procedure report does not answer your questions, please call your gastroenterologist to clarify.  If you requested that your care partner not be given the details of your procedure findings, then the procedure report has been included in a sealed envelope for you to review at your convenience later.  YOU SHOULD EXPECT: Some feelings of bloating in the abdomen. Passage of more gas than usual.  Walking can help get rid of the air that was put into your GI tract during the procedure and reduce the bloating. If you had a lower endoscopy (such as a colonoscopy or flexible sigmoidoscopy) you may notice spotting of blood in your stool or on the toilet paper. If you underwent a bowel prep for your procedure, then you may not have a normal bowel movement for a few days.  DIET: Your first meal following the procedure should be a light meal and then it is ok to progress to your normal diet.  A half-sandwich or bowl of soup is an example of a good first meal.  Heavy or fried foods are harder to digest and may make you feel nauseous or bloated.  Likewise meals heavy in dairy and vegetables can cause extra gas to form and this can also increase the bloating.  Drink plenty of fluids but you should avoid alcoholic beverages for 24 hours.  ACTIVITY: Your care partner should take you home directly after the procedure.  You should plan to take it easy, moving slowly for the rest of the day.  You can resume normal activity the day after the procedure however you should NOT DRIVE or use heavy machinery for 24 hours (because of the sedation medicines used during the test).    SYMPTOMS TO REPORT IMMEDIATELY: A gastroenterologist can be reached at any hour.  During normal business hours, 8:30 AM to 5:00 PM Monday through Friday,  call [phone].  After hours and on weekends, please call the GI answering service at [phone] who will take a message and have the physician on call contact you.   Following lower endoscopy (colonoscopy or flexible sigmoidoscopy):  Excessive amounts of blood in the stool  Significant tenderness or worsening of abdominal pains  Swelling of the abdomen that is new, acute  Fever of 100F or higher  FOLLOW UP: If any biopsies were taken you will be contacted by phone or by letter within the next 1-3 weeks.  Call your gastroenterologist if you have not heard about the biopsies in 3 weeks.  Our staff will call the home number listed on your records the next business day following your procedure to check on you and address any questions or concerns that you may have at that time regarding the information given to you following your procedure. This is a courtesy call and so if there is no answer at the home number and we have not heard from you through the emergency physician on call, we will assume that you have returned to your regular daily activities without incident.  SIGNATURES/CONFIDENTIALITY: You and/or your care partner have signed paperwork which will be entered into your electronic medical record.  These signatures attest to the fact that that the information above on your After Visit Summary has been reviewed and is understood.  Full responsibility of the confidentiality of this   discharge information lies with you and/or your care-partner.  If the polyps removed today were pre-cancerous polyps, you will need a repeat colonoscopy in 5 years. Otherwise, you should continue to follow colorectal screening guidelines for routine risk patients with colonoscopy in 10 years.  Await pathology results.

## 2012-09-22 NOTE — Progress Notes (Signed)
Report to pacu rn, vss, bbs=clear 

## 2012-09-25 ENCOUNTER — Telehealth: Payer: Self-pay

## 2012-09-25 NOTE — Telephone Encounter (Signed)
  Follow up Call-  Call back number 09/22/2012  Post procedure Call Back phone  # [phone]  Permission to leave phone message Yes     Patient questions:  Do you have a fever, pain , or abdominal swelling? no Pain Score  0 *  Have you tolerated food without any problems? yes  Have you been able to return to your normal activities? yes  Do you have any questions about your discharge instructions: Diet   no Medications  no Follow up visit  no  Do you have questions or concerns about your Care? no  Actions: * If pain score is 4 or above: No action needed, pain <4.   No problems per the pt . Maw

## 2012-09-26 ENCOUNTER — Ambulatory Visit (AMBULATORY_SURGERY_CENTER): Payer: Medicare Other | Admitting: Gastroenterology

## 2012-09-26 ENCOUNTER — Encounter: Payer: Self-pay | Admitting: Gastroenterology

## 2012-09-26 VITALS — BP 154/85 | HR 74 | Temp 97.7°F | Resp 16 | Ht 62.0 in | Wt 156.0 lb

## 2012-09-26 DIAGNOSIS — K209 Esophagitis, unspecified: Secondary | ICD-10-CM

## 2012-09-26 DIAGNOSIS — R1310 Dysphagia, unspecified: Secondary | ICD-10-CM

## 2012-09-26 DIAGNOSIS — K222 Esophageal obstruction: Secondary | ICD-10-CM

## 2012-09-26 DIAGNOSIS — K219 Gastro-esophageal reflux disease without esophagitis: Secondary | ICD-10-CM

## 2012-09-26 MED ORDER — SODIUM CHLORIDE 0.9 % IV SOLN: 500.0000 mL | INTRAVENOUS | Status: DC

## 2012-09-26 NOTE — Op Note (Signed)
[facility] [address] [place], [address]   ENDOSCOPY PROCEDURE REPORT  PATIENT: Amanda, Kane  MR#: [id number] BIRTHDATE: Apr 12, 1941 , 71  yrs. old GENDER: Female ENDOSCOPIST: Louis Meckel, MD REFERRED BY:  Ruthe Mannan, M.D.  Claud Kelp, M.D. PROCEDURE DATE:  09/26/2012 PROCEDURE:  EGD w/ biopsy and Maloney dilation of esophagus ASA CLASS:     Class II INDICATIONS:  Dysphagia. MEDICATIONS: MAC sedation, administered by CRNA, propofol (Diprivan) 100mg  IV, and Simethicone 0.6cc PO TOPICAL ANESTHETIC:  DESCRIPTION OF PROCEDURE: After the risks benefits and alternatives of the procedure were thoroughly explained, informed consent was obtained.  The [facility] GIF-H180 [id number] endoscope was introduced through the mouth and advanced to the third portion of the duodenum. Without limitations.  The instrument was slowly withdrawn as the mucosa was fully examined.      Again noted at the GE junction was irregularity of the Z line.  At least 2 areas of gastric appearing mucosa extended release 1 cm proximally.  Biopsies were taken.  An early esophageal stricture was seen.  The 7 mm gastroscope easily traversed the stricture. The remainder of the upper endoscopy exam was otherwise normal. Retroflexed views revealed no abnormalities.     The scope was then withdrawn from the patient.  A #52 French long dilator was passed with minimal resistance. There was no heme.  COMPLICATIONS: There were no complications.  ENDOSCOPIC IMPRESSION: 1.  irregular GE junction-rule out Barrett's esophagus 2.  early esophageal stricture-status post [name] dilation  RECOMMENDATIONS: Await biopsy results REPEAT EXAM:  eSigned:  Louis Meckel, MD 09/26/2012 9:39 AM   CC:

## 2012-09-26 NOTE — Progress Notes (Signed)
Called to room to assist during endoscopic procedure.  Patient ID and intended procedure confirmed with present staff. Received instructions for my participation in the procedure from the performing physician.  

## 2012-09-26 NOTE — Patient Instructions (Signed)
YOU HAD AN ENDOSCOPIC PROCEDURE TODAY AT THE [facility]: Refer to the procedure report that was given to you for any specific questions about what was found during the examination.  If the procedure report does not answer your questions, please call your gastroenterologist to clarify.  If you requested that your care partner not be given the details of your procedure findings, then the procedure report has been included in a sealed envelope for you to review at your convenience later.  YOU SHOULD EXPECT: Some feelings of bloating in the abdomen. Passage of more gas than usual.  Walking can help get rid of the air that was put into your GI tract during the procedure and reduce the bloating. If you had a lower endoscopy (such as a colonoscopy or flexible sigmoidoscopy) you may notice spotting of blood in your stool or on the toilet paper. If you underwent a bowel prep for your procedure, then you may not have a normal bowel movement for a few days.   DIET:  NOTHING TO EAT OR DRINK UNTIL 1030 TODAY. 10:30 UNTIL 11:30 ONLY CLEAR LIQUIDS. 11:30 UNTIL MORNING ONLY SIFT FOODS. RESUME YOUR REGULAR DIET IN AM.   ACTIVITY: Your care partner should take you home directly after the procedure.  You should plan to take it easy, moving slowly for the rest of the day.  You can resume normal activity the day after the procedure however you should NOT DRIVE or use heavy machinery for 24 hours (because of the sedation medicines used during the test).    SYMPTOMS TO REPORT IMMEDIATELY: A gastroenterologist can be reached at any hour.  During normal business hours, 8:30 AM to 5:00 PM Monday through Friday, call [phone].  After hours and on weekends, please call the GI answering service at [phone] who will take a message and have the physician on call contact you.   Following upper endoscopy (EGD)  Vomiting of blood or coffee ground material  New chest pain or pain under the shoulder  blades  Painful or persistently difficult swallowing  New shortness of breath  Fever of 100F or higher  Black, tarry-looking stools  FOLLOW UP: If any biopsies were taken you will be contacted by phone or by letter within the next 1-3 weeks.  Call your gastroenterologist if you have not heard about the biopsies in 3 weeks.  Our staff will call the home number listed on your records the next business day following your procedure to check on you and address any questions or concerns that you may have at that time regarding the information given to you following your procedure. This is a courtesy call and so if there is no answer at the home number and we have not heard from you through the emergency physician on call, we will assume that you have returned to your regular daily activities without incident.  SIGNATURES/CONFIDENTIALITY: You and/or your care partner have signed paperwork which will be entered into your electronic medical record.  These signatures attest to the fact that that the information above on your After Visit Summary has been reviewed and is understood.  Full responsibility of the confidentiality of this discharge information lies with you and/or your care-partner.

## 2012-09-26 NOTE — Progress Notes (Signed)
Patient did not experience any of the following events: a burn prior to discharge; a fall within the facility; wrong site/side/patient/procedure/implant event; or a hospital transfer or hospital admission upon discharge from the facility. (G8907) Patient did not have preoperative order for IV antibiotic SSI prophylaxis. (G8918)  

## 2012-09-26 NOTE — Progress Notes (Signed)
Lidocaine-40mg IV prior to Propofol InductionPropofol given over incremental dosages 

## 2012-09-27 ENCOUNTER — Telehealth: Payer: Self-pay | Admitting: *Deleted

## 2012-09-27 ENCOUNTER — Telehealth (INDEPENDENT_AMBULATORY_CARE_PROVIDER_SITE_OTHER): Payer: Self-pay

## 2012-09-27 ENCOUNTER — Encounter (HOSPITAL_COMMUNITY)
Admission: RE | Admit: 2012-09-27 | Discharge: 2012-09-27 | Disposition: A | Discharge status: Home / Self Care | Payer: Medicare HMO | Source: Ambulatory Visit | Attending: General Surgery | Admitting: General Surgery

## 2012-09-27 ENCOUNTER — Encounter: Payer: Self-pay | Admitting: Gastroenterology

## 2012-09-27 DIAGNOSIS — K8020 Calculus of gallbladder without cholecystitis without obstruction: Secondary | ICD-10-CM

## 2012-09-27 MED ORDER — SINCALIDE 5 MCG IJ SOLR: 0.0200 ug/kg | Freq: Once | INTRAMUSCULAR | Status: AC

## 2012-09-27 MED ORDER — TECHNETIUM TC 99M MEBROFENIN IV KIT
5.0000 | PACK | Freq: Once | INTRAVENOUS | Status: AC | PRN
  Administered 2012-09-27: 5 via INTRAVENOUS

## 2012-09-27 MED ORDER — SINCALIDE 5 MCG IJ SOLR
INTRAMUSCULAR | Status: AC
  Administered 2012-09-27: 1.37 ug via INTRAVENOUS
  Filled 2012-09-27: qty 5

## 2012-09-27 NOTE — Telephone Encounter (Signed)
Message copied by Ivory Broad on Wed Sep 27, 2012  1:56 PM ------      Message from: [name]      Created: Wed Sep 27, 2012  1:23 PM       Call radiology reports to patient.Tell her that the biliary scan is abnormal. Spell her that this suggests gallbladder disease. We will discuss this further when I see her next time. ------

## 2012-09-27 NOTE — Telephone Encounter (Signed)
  Follow up Call-  Call back number 09/26/2012 09/22/2012  Post procedure Call Back phone  # [phone]  Permission to leave phone message Yes Yes     Patient questions:  Do you have a fever, pain , or abdominal swelling? no Pain Score  0 *  Have you tolerated food without any problems? yes  Have you been able to return to your normal activities? yes  Do you have any questions about your discharge instructions: Diet   no Medications  no Follow up visit  no  Do you have questions or concerns about your Care? no  Actions: * If pain score is 4 or above: No action needed, pain <4.

## 2012-09-27 NOTE — Telephone Encounter (Signed)
I called and notified the pt of results.  She requested a sooner appointment because she has completed testing and she wants to get her bx done soon.  I moved her up to tomorrow.

## 2012-09-28 ENCOUNTER — Encounter (INDEPENDENT_AMBULATORY_CARE_PROVIDER_SITE_OTHER): Payer: Self-pay | Admitting: General Surgery

## 2012-09-28 ENCOUNTER — Ambulatory Visit (INDEPENDENT_AMBULATORY_CARE_PROVIDER_SITE_OTHER): Payer: Medicare HMO | Admitting: General Surgery

## 2012-09-28 VITALS — BP 120/78 | HR 72 | Temp 98.0°F | Resp 24 | Ht 63.0 in | Wt 154.0 lb

## 2012-09-28 DIAGNOSIS — K8020 Calculus of gallbladder without cholecystitis without obstruction: Secondary | ICD-10-CM

## 2012-09-28 DIAGNOSIS — R599 Enlarged lymph nodes, unspecified: Secondary | ICD-10-CM

## 2012-09-28 DIAGNOSIS — R590 Localized enlarged lymph nodes: Secondary | ICD-10-CM

## 2012-09-28 NOTE — Progress Notes (Signed)
Patient ID: Amanda Kane, female   DOB: July 22, 1940, 72 y.o.   MRN: [id number]  Chief Complaint  Patient presents with  . Follow-up    HPI Amanda Kane is a 72 y.o. female.  She returns for further discussion of her abdominal pain, gallstones, and mesenteric adenopathy.  Dr. Melvia Heaps performed upper endoscopy and colonoscopy. The upper endoscopy looked recently normal although there was a slight GE stricture. No neoplasia. The colonoscopy showed a flat cecal polyp which was biopsied and shows a tubular adenoma but no dysplasia. Diverticulosis was noted. There was no cancer.  Hepatobiliary scan shows a reduced ejection fraction consistent with biliary dyskinesia and her epigastric pain was reproduced with CCK infusion.   Recall that she is referred to me by Dr. Jethro Bolus, for evaluation of  abdominal mesenteric adenopathy. She also has abdominal pain. Dr. Ruthe Mannan is her primary care physician. Dr. McDiarmid is her urologist.   This patient has a long history of adenomatous colon polyps. She states that she had a malignant polyp removed in 1993 but did not require surgical intervention. She has been followed more recently Dr. Melvia Heaps. Last colonoscopy 12/26/2008 showed left sided diverticulosis and a 2 mm adenomatous polyp. She's been treated as an outpatient for diverticulitis takes Metamucil daily her bowels are regulated. She also has an esophageal stricture has been dilated in the past.  She was found to have some mesenteric adenopathy 2 or 3 years ago on CT scan. Pelvic ultrasound was normal. Recent CT scan of the abdomen and pelvis on 08/30/2012 shows gallstones but no gallbladder inflammation. There is further progression and mesenteric lymphadenopathy in the small bowel mesentery, mostly distal small bowel mesentery and ileocolic area. There are no enlarged pelvic or inguinal lymph nodes. Uterus ovaries and bladder looked normal. Diverticular changes of the colon noted. She has been  followed for this adenopathy by Dr. Gaylyn Rong for a year. He now referred her to me because of progression radiographically of the adenopathy..  Associated with this is a history of abdominal pain. She states she has had stomach problems for many years. This used to just be reflux symptoms. She subsequently had outpatient management for diverticulitis. For the past 6 months she notices abdominal fullness and pain under her rib cages. She says she gets epigastric and substernal pain after eating. She is intolerant of fatty foods. No change in her bowel movements. Last colonoscopy was 2010.   ACE level was normal and so Dr. Gaylyn Rong does not think she has sarcoidosis  Her diagnostic evaluation is now complete and she is ready to proceed with surgical intervention, if indicated.  HPI  Past Medical History  Diagnosis Date  . Arthritis   . Esophageal reflux   . Myalgia and myositis, unspecified   . Pure hypercholesterolemia   . Unspecified hypothyroidism   . Colonic polyp     last one around 2008.  Due one now.   . Interstitial cystitis   . Hypertension   . PUD (peptic ulcer disease)   . MVP (mitral valve prolapse)   . Adenopathy 02/25/2012  . Diverticulosis     past diverticulitis; last 2 years ago. no resection.   . Fibromyalgia   . Lymphadenopathy, abdominal 09/01/2012  . Allergy   . Blood transfusion without reported diagnosis   . Cancer     malginant polyps colono 20 years ago    Past Surgical History  Procedure Laterality Date  . Hysteroscopy w/d&c  [place]  . Appendectomy    . Inner ear surgery    . Breast lumpectomy      bilateral; benign  . Tubal ligation    . Eye surgery      cateracts  . Tonsillectomy    . Left knee surgery      Family History  Problem Relation Age of Onset  . Coronary artery disease Brother     MI at age 41  . Heart failure Mother   . Stroke Father   . Stomach cancer Paternal Grandmother   . Rectal cancer Neg Hx   . Esophageal cancer Neg Hx   .  Colon cancer Neg Hx     Social History History  Substance Use Topics  . Smoking status: Never Smoker   . Smokeless tobacco: Never Used  . Alcohol Use: Yes     Comment: Glass wine per night    Allergies  Allergen Reactions  . Atorvastatin   . Celecoxib     REACTION: Itch  . Ciprofloxacin   . Ezetimibe     REACTION: Aches  . Lisinopril Cough  . Macrobid (Nitrofurantoin Macrocrystal)   . Metronidazole   . Pregabalin     REACTION: Numb  . Simvastatin     REACTION: Aches  . Sulfonamide Derivatives     Current Outpatient Prescriptions  Medication Sig Dispense Refill  . amitriptyline (ELAVIL) 10 MG tablet TAKE 1 TABLET (10 MG TOTAL) BY MOUTH AT BEDTIME.  30 tablet  0  . amLODipine (NORVASC) 5 MG tablet Take 1 tablet (5 mg total) by mouth daily.  90 tablet  3  . Bepotastine Besilate 1.5 % SOLN 1 drop into affected eye daily.  1 Bottle  0  . dexlansoprazole (DEXILANT) 60 MG capsule Take 1 capsule (60 mg total) by mouth daily.  30 capsule  3  . Famotidine (PEPCID PO) Take by mouth as needed.      . gabapentin (NEURONTIN) 300 MG capsule As needed      . HYDROcodone-acetaminophen (NORCO) 5-325 MG per tablet Take 1 tablet by mouth every 4 (four) hours as needed.      . phenazopyridine (PYRIDIUM) 100 MG tablet Take 1 tablet (100 mg total) by mouth 3 (three) times daily as needed.  90 tablet  11  . SYNTHROID 75 MCG tablet TAKE 1 TABLET (75 MCG TOTAL) BY MOUTH DAILY.  90 tablet  1  . traMADol (ULTRAM) 50 MG tablet Take 50 mg by mouth every 8 (eight) hours as needed for pain.       No current facility-administered medications for this visit.    Review of Systems Review of Systems  Constitutional: Negative for fever, chills and unexpected weight change.  HENT: Negative for hearing loss, congestion, sore throat, trouble swallowing and voice change.   Eyes: Negative for visual disturbance.  Respiratory: Negative for cough and wheezing.   Cardiovascular: Negative for chest pain,  palpitations and leg swelling.  Gastrointestinal: Positive for abdominal pain. Negative for nausea, vomiting, diarrhea, constipation, blood in stool, abdominal distention and anal bleeding.  Genitourinary: Negative for hematuria, vaginal bleeding and difficulty urinating.  Musculoskeletal: Negative for arthralgias.  Skin: Negative for rash and wound.  Neurological: Negative for seizures, syncope and headaches.  Hematological: Negative for adenopathy. Does not bruise/bleed easily.  Psychiatric/Behavioral: Negative for confusion.    Blood pressure 120/78, pulse 72, temperature 98 F (36.7 C), temperature source Oral, resp. rate 24, height 5\' 3"  (1.6 m), weight 154 lb (69.854 kg).  Physical  Exam Physical Exam  Constitutional: She is oriented to person, place, and time. She appears well-developed and well-nourished. No distress.  BMI 28  HENT:  Head: Normocephalic and atraumatic.  Nose: Nose normal.  Mouth/Throat: No oropharyngeal exudate.  Eyes: Conjunctivae and EOM are normal. Pupils are equal, round, and reactive to light. Left eye exhibits no discharge. No scleral icterus.  Neck: Neck supple. No JVD present. No tracheal deviation present. No thyromegaly present.  Cardiovascular: Normal rate, regular rhythm, normal heart sounds and intact distal pulses.   No murmur heard. Pulmonary/Chest: Effort normal and breath sounds normal. No respiratory distress. She has no wheezes. She has no rales. She exhibits no tenderness.  Abdominal: Soft. Bowel sounds are normal. She exhibits no distension and no mass. There is no tenderness. There is no rebound and no guarding.  Abdomen is soft. Transverse scar below the umbilicus. Vertically oriented right lower quadrant scar from appendectomy. No hernias noted. A little bit tender everywhere but mostly in the epigastrium and bilateral subcostal areas. No mass. No inguinal adenopathy.  Musculoskeletal: She exhibits no edema and no tenderness.   Lymphadenopathy:    She has no cervical adenopathy.  Neurological: She is alert and oriented to person, place, and time. She exhibits normal muscle tone. Coordination normal.  Skin: Skin is warm. No rash noted. She is not diaphoretic. No erythema. No pallor.  Psychiatric: She has a normal mood and affect. Her behavior is normal. Judgment and thought content normal.    Data Reviewed Colonoscopy and endoscopy reports. Hepatobiliary scan.  Assessment    Progressive adenopathy of the distal small bowel and ileocolic mesentery. This might be a primary neoplastic process such as lymphoma, and reactive process to chronic diverticulitis. Less likely granulomatous disease or metastatic from malignancy of the small bowel or colon.  Abdominal pain. The cause for this is unexplained and seems unlikely that her mesenteric adenopathy would cause this. This was complicated because of her esophageal stricture and reflux in the past. She also has gallstones and some of her symptoms do sound like chronic cholecystitis and biliary colic   Gallstones   Interstitial cystitis and Chronic UTIs, followed by Dr. McDiarmid   Depression   Fibromyalgia   Hypertension   History appendectomy   History premalignant adenomatous colon polyps   History diverticulitis treated as outpatient   History of appendectomy.     Plan    We had a lengthy discussion. We decided that she would be scheduled for a laparoscopic mesenteric lymph node biopsy, laparoscopic cholecystectomy with cholangiogram, possible open.  I discussed the indications, details, techniques, and numerous risk of the surgery with the patient and her husband. They understand all these issues and all of their questions were answered. They agree with this plan.        Angelia Mould. Derrell Lolling, M.D., [facility] General and Minimally invasive Surgery Breast and Colorectal Surgery Office:   [phone] Pager:    [phone]  09/28/2012, 12:37 PM

## 2012-09-28 NOTE — Patient Instructions (Signed)
The upper endoscopy and colonoscopy showed no evidence of cancer, which is great news.  The biliary scan shows abnormal functioning of the gallbladder with delayed emptying.  I think that you have 2 problems. First there is the question of the enlarged lymph nodes in your intestinal mesentery and whether  they may represent a lymphoma or not. The second problem is gallstones, which is probably causing your upper abdominal symptoms. Gastroesophageal reflux can also cause some of the symptoms.  You will be scheduled for laparoscopic cholecystectomy with cholangiogram, and laparoscopic lymph node biopsy, possible open in the near future.     Laparoscopic Cholecystectomy Laparoscopic cholecystectomy is surgery to remove the gallbladder. The gallbladder is located slightly to the right of center in the abdomen, behind the liver. It is a concentrating and storage sac for the bile produced in the liver. Bile aids in the digestion and absorption of fats. Gallbladder disease (cholecystitis) is an inflammation of your gallbladder. This condition is usually caused by a buildup of gallstones (cholelithiasis) in your gallbladder. Gallstones can block the flow of bile, resulting in inflammation and pain. In severe cases, emergency surgery may be required. When emergency surgery is not required, you will have time to prepare for the procedure. Laparoscopic surgery is an alternative to open surgery. Laparoscopic surgery usually has a shorter recovery time. Your common bile duct may also need to be examined and explored. Your caregiver will discuss this with you if he or she feels this should be done. If stones are found in the common bile duct, they may be removed. LET YOUR CAREGIVER KNOW ABOUT:  Allergies to food or medicine.  Medicines taken, including vitamins, herbs, eyedrops, over-the-counter medicines, and creams.  Use of steroids (by mouth or creams).  Previous problems with anesthetics or numbing  medicines.  History of bleeding problems or blood clots.  Previous surgery.  Other health problems, including diabetes and kidney problems.  Possibility of pregnancy, if this applies. RISKS AND COMPLICATIONS All surgery is associated with risks. Some problems that may occur following this procedure include:  Infection.  Damage to the common bile duct, nerves, arteries, veins, or other internal organs such as the stomach or intestines.  Bleeding.  A stone may remain in the common bile duct. BEFORE THE PROCEDURE  Do not take aspirin for 3 days prior to surgery or blood thinners for 1 week prior to surgery.  Do not eat or drink anything after midnight the night before surgery.  Let your caregiver know if you develop a cold or other infectious problem prior to surgery.  You should be present 60 minutes before the procedure or as directed. PROCEDURE  You will be given medicine that makes you sleep (general anesthetic). When you are asleep, your surgeon will make several small cuts (incisions) in your abdomen. One of these incisions is used to insert a small, lighted scope (laparoscope) into the abdomen. The laparoscope helps the surgeon see into your abdomen. Carbon dioxide gas will be pumped into your abdomen. The gas allows more room for the surgeon to perform your surgery. Other operating instruments are inserted through the other incisions. Laparoscopic procedures may not be appropriate when:  There is major scarring from previous surgery.  The gallbladder is extremely inflamed.  There are bleeding disorders or unexpected cirrhosis of the liver.  A pregnancy is near term.  Other conditions make the laparoscopic procedure impossible. If your surgeon feels it is not safe to continue with a laparoscopic procedure, he or she  will perform an open abdominal procedure. In this case, the surgeon will make an incision to open the abdomen. This gives the surgeon a larger view and field  to work within. This may allow the surgeon to perform procedures that sometimes cannot be performed with a laparoscope alone. Open surgery has a longer recovery time. AFTER THE PROCEDURE  You will be taken to the recovery area where a nurse will watch and check your progress.  You may be allowed to go home the same day.  Do not resume physical activities until directed by your caregiver.  You may resume a normal diet and activities as directed. Document Released: 05/03/2005 Document Revised: 07/26/2011 Document Reviewed: 10/16/2010 [facility] Patient Information 2013 [place], [place].

## 2012-09-29 ENCOUNTER — Telehealth: Payer: Self-pay | Admitting: *Deleted

## 2012-09-29 ENCOUNTER — Telehealth (INDEPENDENT_AMBULATORY_CARE_PROVIDER_SITE_OTHER): Payer: Self-pay

## 2012-09-29 ENCOUNTER — Other Ambulatory Visit: Payer: Self-pay | Admitting: Oncology

## 2012-09-29 ENCOUNTER — Encounter: Payer: Medicare HMO | Admitting: Gastroenterology

## 2012-09-29 NOTE — Telephone Encounter (Signed)
Pt called the office today to express concern that her surgery is not until 11/07/12.  She stated that Dr. Gaylyn Rong wanted this surgery performed sooner rather than later due to the pt's lymphoma.  She is suffering from severe hot and cold flashes, headaches and extreme fatigue.  She would like Dr. Derrell Lolling to call her on Monday to discuss these concerns.

## 2012-09-29 NOTE — Telephone Encounter (Signed)
Called pt and s/w husband.  Informed him of order sent for appt the second week of July.  He asks if appt can be the first week of July as they are planning a 2 week cruise starting on the second week?

## 2012-09-29 NOTE — Telephone Encounter (Signed)
Pt left VM to report that her Lymph Node biopsy will not be done until 11/07/12 by Dr. Derrell Lolling.  She needs to r/s her appt w/ Dr. Gaylyn Rong on 5/29 until after the biopsy.

## 2012-09-29 NOTE — Telephone Encounter (Signed)
I'll place a POF for early July.

## 2012-09-29 NOTE — Telephone Encounter (Signed)
Pt called back and asks if anything can be done to have her biopsy done sooner than 6/24?  States Dr. Derrell Lolling cannot do it sooner than 6/24.  She states she continues to feel bad with hot and cold episodes frequently.  She expresses a lot of concern about waiting so long to find out if she has lymphoma.  She also reports Dr. Derrell Lolling plans to remove her Gallbladder at the same time as lymph node biopsy. Instructed pt to call [facility] to see if another surgeon in the group can perform the surgery sooner.  If not,  Call back and will ask Dr. Gaylyn Rong if she needs to be referred to another surgery group?  Pt verbalized understanding.

## 2012-09-30 ENCOUNTER — Other Ambulatory Visit: Payer: Self-pay | Admitting: Family Medicine

## 2012-10-01 NOTE — Telephone Encounter (Signed)
Amanda Kane,  When I posted this case, I thought that I said to do before June 1. The patient is right, It should be done sooner. And I will do whatever it takes to do so.  Post the case for May 22 after office or May 30 second case.  Tell me tomorrow morning what the outcome is.  hmi

## 2012-10-02 ENCOUNTER — Telehealth: Payer: Self-pay | Admitting: Oncology

## 2012-10-02 ENCOUNTER — Telehealth: Payer: Self-pay | Admitting: *Deleted

## 2012-10-02 ENCOUNTER — Telehealth (INDEPENDENT_AMBULATORY_CARE_PROVIDER_SITE_OTHER): Payer: Self-pay | Admitting: *Deleted

## 2012-10-02 NOTE — Telephone Encounter (Signed)
Pt reports her Biopsy has now been moved up sooner to 11/05/12 and asks if her appt w/ Dr. Gaylyn Rong could be moved up as well.   POF sent to scheduling to r/s pt to 6/29,  One week after biopsy.  Instructed pt to expect a call from Scheduler. She verbalized understanding.

## 2012-10-02 NOTE — Telephone Encounter (Signed)
I have spoken to [place] in scheduling.  She advised me of the issues and we discussed what might need to be changed.  I have paged Dr Derrell Lolling to discuss.

## 2012-10-02 NOTE — Telephone Encounter (Signed)
I called and let the pt know I got her message and have spoken to Dr Derrell Lolling and scheduling.  We are working on a sooner date.  I asked her if she is only wanting to go to [facility] or can she go to [facility].  She said she can go anywhere.  I told her Denny Peon is working on this and we will get her moved up as soon as we can.

## 2012-10-02 NOTE — Telephone Encounter (Signed)
Patient called to ask about getting her surgery scheduled for earlier than 6/24.  Patient states she is not doing well and doesn't feel that she can wait that long.  Patient even asking to have another surgeon preform surgery if Derrell Lolling MD unavailable.

## 2012-10-03 ENCOUNTER — Encounter (HOSPITAL_COMMUNITY): Payer: Self-pay | Admitting: Pharmacy Technician

## 2012-10-03 NOTE — H&P (Signed)
Amanda Kane    MRN:  [id number]   Description: 72 year old female  Provider: [name], MD  Department: [facility]     Diagnoses    Lymphadenopathy, abdominal    -  Primary    785.6    Gallstones        574.20      Current Vitals - Last Recorded    BP Pulse Temp(Src) Resp Ht Wt    120/78 72 98 F (36.7 C) (Oral) 24 5\' 3"  (1.6 m) 154 lb (69.854 kg)    BMI - 27.29 kg/m2                 History and Physical   [name], MD     Status: Signed                        HPI Amanda Kane is a 72 y.o. female.  She returns for further discussion of her abdominal pain, gallstones, and mesenteric adenopathy.   Dr. Melvia Heaps performed upper endoscopy and colonoscopy. The upper endoscopy looked recently normal although there was a slight GE stricture. No neoplasia. The colonoscopy showed a flat cecal polyp which was biopsied and shows a tubular adenoma but no dysplasia. Diverticulosis was noted. There was no cancer.   Hepatobiliary scan shows a reduced ejection fraction consistent with biliary dyskinesia and her epigastric pain was reproduced with CCK infusion.    Recall that she is referred to me by Dr. Jethro Bolus, for evaluation of  abdominal mesenteric adenopathy. She also has abdominal pain. Dr. Ruthe Mannan is her primary care physician. Dr. McDiarmid is her urologist.    This patient has a long history of adenomatous colon polyps. She states that she had a malignant polyp removed in 1993 but did not require surgical intervention. She has been followed more recently Dr. Melvia Heaps. Last colonoscopy 12/26/2008 showed left sided diverticulosis and a 2 mm adenomatous polyp. She's been treated as an outpatient for diverticulitis takes Metamucil daily her bowels are regulated. She also has an esophageal stricture has been dilated in the past.   She was found to have some mesenteric adenopathy 2 or 3 years ago on CT scan. Pelvic ultrasound was normal. Recent  CT scan of the abdomen and pelvis on 08/30/2012 shows gallstones but no gallbladder inflammation. There is further progression and mesenteric lymphadenopathy in the small bowel mesentery, mostly distal small bowel mesentery and ileocolic area. There are no enlarged pelvic or inguinal lymph nodes. Uterus ovaries and bladder looked normal. Diverticular changes of the colon noted. She has been followed for this adenopathy by Dr. Gaylyn Rong for a year. He now referred her to me because of progression radiographically of the adenopathy..   Associated with this is a history of abdominal pain. She states she has had stomach problems for many years. This used to just be reflux symptoms. She subsequently had outpatient management for diverticulitis. For the past 6 months she notices abdominal fullness and pain under her rib cages. She says she gets epigastric and substernal pain after eating. She is intolerant of fatty foods. No change in her bowel movements. Last colonoscopy was 2010.    ACE level was normal and so Dr. Gaylyn Rong does not think she has sarcoidosis   Her diagnostic evaluation is now complete and she is ready to proceed with surgical intervention, if indicated.        Past Medical History  Diagnosis  Date   .  Arthritis     .  Esophageal reflux     .  Myalgia and myositis, unspecified     .  Pure hypercholesterolemia     .  Unspecified hypothyroidism     .  Colonic polyp         last one around 2008.  Due one now.    .  Interstitial cystitis     .  Hypertension     .  PUD (peptic ulcer disease)     .  MVP (mitral valve prolapse)     .  Adenopathy  02/25/2012   .  Diverticulosis         past diverticulitis; last 2 years ago. no resection.    .  Fibromyalgia     .  Lymphadenopathy, abdominal  09/01/2012   .  Allergy     .  Blood transfusion without reported diagnosis     .  Cancer         malginant polyps colono 20 years ago         Past Surgical History   Procedure  Laterality  Date    .  Hysteroscopy w/d&c           [place]   .  Appendectomy       .  Inner ear surgery       .  Breast lumpectomy           bilateral; benign   .  Tubal ligation       .  Eye surgery           cateracts   .  Tonsillectomy       .  Left knee surgery             Family History   Problem  Relation  Age of Onset   .  Coronary artery disease  Brother         MI at age 71   .  Heart failure  Mother     .  Stroke  Father     .  Stomach cancer  Paternal Grandmother     .  Rectal cancer  Neg Hx     .  Esophageal cancer  Neg Hx     .  Colon cancer  Neg Hx          Social History History   Substance Use Topics   .  Smoking status:  Never Smoker    .  Smokeless tobacco:  Never Used   .  Alcohol Use:  Yes         Comment: Glass wine per night         Allergies   Allergen  Reactions   .  Atorvastatin     .  Celecoxib         REACTION: Itch   .  Ciprofloxacin     .  Ezetimibe         REACTION: Aches   .  Lisinopril  Cough   .  Macrobid (Nitrofurantoin Macrocrystal)     .  Metronidazole     .  Pregabalin         REACTION: Numb   .  Simvastatin         REACTION: Aches   .  Sulfonamide Derivatives           Current Outpatient Prescriptions   Medication  Sig  Dispense  Refill   .  amitriptyline (ELAVIL) 10 MG tablet  TAKE 1 TABLET (10 MG TOTAL) BY MOUTH AT BEDTIME.   30 tablet   0   .  amLODipine (NORVASC) 5 MG tablet  Take 1 tablet (5 mg total) by mouth daily.   90 tablet   3   .  Bepotastine Besilate 1.5 % SOLN  1 drop into affected eye daily.   1 Bottle   0   .  dexlansoprazole (DEXILANT) 60 MG capsule  Take 1 capsule (60 mg total) by mouth daily.   30 capsule   3   .  Famotidine (PEPCID PO)  Take by mouth as needed.         .  gabapentin (NEURONTIN) 300 MG capsule  As needed         .  HYDROcodone-acetaminophen (NORCO) 5-325 MG per tablet  Take 1 tablet by mouth every 4 (four) hours as needed.         .  phenazopyridine (PYRIDIUM) 100 MG tablet  Take 1 tablet (100  mg total) by mouth 3 (three) times daily as needed.   90 tablet   11   .  SYNTHROID 75 MCG tablet  TAKE 1 TABLET (75 MCG TOTAL) BY MOUTH DAILY.   90 tablet   1   .  traMADol (ULTRAM) 50 MG tablet  Take 50 mg by mouth every 8 (eight) hours as needed for pain.             No current facility-administered medications for this visit.        Review of Systems   Constitutional: Negative for fever, chills and unexpected weight change.  HENT: Negative for hearing loss, congestion, sore throat, trouble swallowing and voice change.   Eyes: Negative for visual disturbance.  Respiratory: Negative for cough and wheezing.   Cardiovascular: Negative for chest pain, palpitations and leg swelling.  Gastrointestinal: Positive for abdominal pain. Negative for nausea, vomiting, diarrhea, constipation, blood in stool, abdominal distention and anal bleeding.  Genitourinary: Negative for hematuria, vaginal bleeding and difficulty urinating.  Musculoskeletal: Negative for arthralgias.  Skin: Negative for rash and wound.  Neurological: Negative for seizures, syncope and headaches.  Hematological: Negative for adenopathy. Does not bruise/bleed easily.  Psychiatric/Behavioral: Negative for confusion.      Blood pressure 120/78, pulse 72, temperature 98 F (36.7 C), temperature source Oral, resp. rate 24, height 5\' 3"  (1.6 m), weight 154 lb (69.854 kg).   Physical Exam   Constitutional: She is oriented to person, place, and time. She appears well-developed and well-nourished. No distress.  BMI 28  HENT:   Head: Normocephalic and atraumatic.   Nose: Nose normal.   Mouth/Throat: No oropharyngeal exudate.  Eyes: Conjunctivae and EOM are normal. Pupils are equal, round, and reactive to light. Left eye exhibits no discharge. No scleral icterus.  Neck: Neck supple. No JVD present. No tracheal deviation present. No thyromegaly present.  Cardiovascular: Normal rate, regular rhythm, normal heart sounds and  intact distal pulses.    No murmur heard. Pulmonary/Chest: Effort normal and breath sounds normal. No respiratory distress. She has no wheezes. She has no rales. She exhibits no tenderness.  Abdominal: Soft. Bowel sounds are normal. She exhibits no distension and no mass. There is no tenderness. There is no rebound and no guarding.  Abdomen is soft. Transverse scar below the umbilicus. Vertically oriented right lower quadrant scar from appendectomy. No hernias noted. A little bit tender everywhere but mostly in the epigastrium  and bilateral subcostal areas. No mass. No inguinal adenopathy.  Musculoskeletal: She exhibits no edema and no tenderness.  Lymphadenopathy:    She has no cervical adenopathy.  Neurological: She is alert and oriented to person, place, and time. She exhibits normal muscle tone. Coordination normal.  Skin: Skin is warm. No rash noted. She is not diaphoretic. No erythema. No pallor.  Psychiatric: She has a normal mood and affect. Her behavior is normal. Judgment and thought content normal.      Data Reviewed Colonoscopy and endoscopy reports. Hepatobiliary scan.   Assessment     Progressive adenopathy of the distal small bowel and ileocolic mesentery. This might be a primary neoplastic process such as lymphoma, and reactive process to chronic diverticulitis. Less likely granulomatous disease or metastatic from malignancy of the small bowel or colon.   Abdominal pain. The cause for this is unexplained and seems unlikely that her mesenteric adenopathy would cause this. This was complicated because of her esophageal stricture and reflux in the past. She also has gallstones and some of her symptoms do sound like chronic cholecystitis and biliary colic    Gallstones    Interstitial cystitis and Chronic UTIs, followed by Dr. McDiarmid    Depression    Fibromyalgia    Hypertension    History appendectomy    History premalignant adenomatous colon polyps     History diverticulitis treated as outpatient    History of appendectomy.       Plan    We had a lengthy discussion. We decided that she would be scheduled for a laparoscopic mesenteric lymph node biopsy, laparoscopic cholecystectomy with cholangiogram, possible open.   I discussed the indications, details, techniques, and numerous risk of the surgery with the patient and her husband. They understand all these issues and all of their questions were answered. They agree with this plan.           Angelia Mould. Derrell Lolling, M.D., [facility] General and Minimally invasive Surgery Breast and Colorectal Surgery Office:   [phone] Pager:   [phone]

## 2012-10-04 ENCOUNTER — Encounter (HOSPITAL_COMMUNITY)
Admission: RE | Admit: 2012-10-04 | Discharge: 2012-10-04 | Disposition: A | Discharge status: Home / Self Care | Payer: Medicare HMO | Source: Ambulatory Visit | Attending: General Surgery | Admitting: General Surgery

## 2012-10-04 ENCOUNTER — Ambulatory Visit (HOSPITAL_COMMUNITY)
Admission: RE | Admit: 2012-10-04 | Discharge: 2012-10-04 | Disposition: A | Discharge status: Home / Self Care | Payer: Medicare HMO | Source: Ambulatory Visit | Attending: Anesthesiology | Admitting: Anesthesiology

## 2012-10-04 ENCOUNTER — Encounter (HOSPITAL_COMMUNITY): Payer: Self-pay

## 2012-10-04 HISTORY — DX: Dermatitis, unspecified: L30.9

## 2012-10-04 HISTORY — DX: Headache: R51

## 2012-10-04 HISTORY — DX: Personal history of other diseases of the digestive system: Z87.19

## 2012-10-04 LAB — CBC WITH DIFFERENTIAL/PLATELET
Basophils Relative: 1 % (ref 0–1)
Eosinophils Absolute: 0.4 10*3/uL (ref 0.0–0.7)
Hemoglobin: 12.8 g/dL (ref 12.0–15.0)
Lymphocytes Relative: 40 % (ref 12–46)
MCHC: 32.2 g/dL (ref 30.0–36.0)
Neutrophils Relative %: 48 % (ref 43–77)
RBC: 4.79 MIL/uL (ref 3.87–5.11)

## 2012-10-04 LAB — SURGICAL PCR SCREEN
MRSA, PCR: NEGATIVE
Staphylococcus aureus: NEGATIVE

## 2012-10-04 LAB — URINALYSIS, ROUTINE W REFLEX MICROSCOPIC
Bilirubin Urine: NEGATIVE
Glucose, UA: NEGATIVE mg/dL
Hgb urine dipstick: NEGATIVE
Ketones, ur: NEGATIVE mg/dL
pH: 5.5 (ref 5.0–8.0)

## 2012-10-04 LAB — COMPREHENSIVE METABOLIC PANEL
ALT: 20 U/L (ref 0–35)
AST: 23 U/L (ref 0–37)
Albumin: 4 g/dL (ref 3.5–5.2)
Alkaline Phosphatase: 88 U/L (ref 39–117)
GFR calc Af Amer: >90 mL/min (ref >90)
Glucose, Bld: 102 mg/dL — ABNORMAL HIGH (ref 70–99)
Potassium: 3.8 mEq/L (ref 3.5–5.1)
Sodium: 142 mEq/L (ref 135–145)
Total Protein: 7.1 g/dL (ref 6.0–8.3)

## 2012-10-04 MED ORDER — CHLORHEXIDINE GLUCONATE 4 % EX LIQD: 1.0000 "application " | Freq: Once | CUTANEOUS | Status: DC

## 2012-10-04 MED ORDER — CEFAZOLIN SODIUM-DEXTROSE 2-3 GM-% IV SOLR
2.0000 g | INTRAVENOUS | Status: AC
  Administered 2012-10-05: 2 g via INTRAVENOUS
  Filled 2012-10-04: qty 50

## 2012-10-04 NOTE — Progress Notes (Addendum)
Pt denies SOB and chest pain. Pt unsure of name of cardiologist. Revonda Standard, Physician Assistant made aware of abnormal EKG; Revonda Standard to review EKG and cardiology history.  Treat pt DOS if PCR is positive.

## 2012-10-04 NOTE — Progress Notes (Signed)
Anesthesia chart review: Patient is a 72 year old female scheduled for laparoscopic cholecystectomy on 10/05/12 by Dr. Derrell Lolling.   History includes nonsmoker, hypercholesterolemia, hypothyroidism, peptic ulcer disease, mitral valve prolapse, diverticulosis, fibromyalgia, eczema, however hernia, headaches, malignant colon polyps s/p polypectomy, interstitial cystitis.  PCP is Dr. Ruthe Mannan.     She was evaluated by cardiologist Dr. Jens Som on 02/10/12 for evaluation of atypical chest pain. He ordered a nuclear stress test which was normal, so no additional evaluation was recommended. EKG on 02/03/12 showed SR, inferior infarct (age undetermined).  Nuclear stress test on 02/16/12 showed: Normal stress nuclear study. No evidence of ischemia. LV Ejection Fraction: 81%. LV Wall Motion: NL LV Function; NL Wall Motion.   Echo on 07/21/06 showed: - Overall left ventricular systolic function was normal. Left ventricular ejection fraction was estimated to be 60 %. There was no diagnostic evidence of left ventricular regional wall motion abnormalities. - There is flat closure of the mitral valve. Trivial MR. - There was mild to moderate tricuspid valvular regurgitation.  CXR on 10/04/12 showed negative exam.  Preoperative CBC (without diff), CMET, and UA noted.  Other labs are still pending.   Based on currently available information then would anticipate that she could proceed as planned.  She will be evaluated by her assigned anesthesiologist on the day of surgery.  Velna Ochs [facility]/Anesthesiology Phone [phone] 10/04/2012 5:27 PM

## 2012-10-04 NOTE — Pre-Procedure Instructions (Signed)
Amanda Kane  10/04/2012   Your procedure is scheduled on: Thursday, Oct 05, 2012  Report to [facility] at 12:30 PM  Call this number if you have problems the morning of surgery: [phone]   Remember:   Do not eat food or drink liquids after midnight.   Take these medicines the morning of surgery with A SIP OF WATER: amLODipine (NORVASC) 5 MG tablet               levothyroxine (SYNTHROID, LEVOTHROID) 75 MCG tablet  If needed:  dexlansoprazole (DEXILANT) 60 MG capsule for heartburn,FAMOTIDINE PO for reflux HYDROcodone-acetaminophen (NORCO) 5-325 MG per tablet for pain Stop taking Aspirin, Coumadin and herbal medications. Do not take any NSAIDs ie: Ibuprofen, Advil, Naproxen or any medication containing Aspirin.             Do not wear jewelry, make-up or nail polish.  Do not wear lotions, powders, or perfumes. You may wear deodorant.  Do not shave 48 hours prior to surgery.   Do not bring valuables to the hospital.  Contacts, dentures or bridgework may not be worn into surgery.  Leave suitcase in the car. After surgery it may be brought to your room.  For patients admitted to the hospital, checkout time is 11:00 AM the day of discharge.   Patients discharged the day of surgery will not be allowed to drive home.  Name and phone number of your driver:   Special Instructions: Shower using CHG 2 nights before surgery and the night before surgery.  If you shower the day of surgery use CHG.  Use special wash - you have one bottle of CHG for all showers.  You should use approximately 1/3 of the bottle for each shower.   Please read over the following fact sheets that you were given: Pain Booklet, Coughing and Deep Breathing and Surgical Site Infection Prevention

## 2012-10-05 ENCOUNTER — Ambulatory Visit: Payer: Medicare HMO | Admitting: Gastroenterology

## 2012-10-05 ENCOUNTER — Encounter (HOSPITAL_COMMUNITY): Payer: Self-pay | Admitting: Surgery

## 2012-10-05 ENCOUNTER — Ambulatory Visit (HOSPITAL_COMMUNITY): Payer: Medicare HMO

## 2012-10-05 ENCOUNTER — Encounter (HOSPITAL_COMMUNITY): Payer: Self-pay | Admitting: Vascular Surgery

## 2012-10-05 ENCOUNTER — Encounter (INDEPENDENT_AMBULATORY_CARE_PROVIDER_SITE_OTHER): Payer: Medicare HMO | Admitting: General Surgery

## 2012-10-05 ENCOUNTER — Ambulatory Visit (HOSPITAL_COMMUNITY): Payer: Medicare HMO | Admitting: Anesthesiology

## 2012-10-05 ENCOUNTER — Encounter (HOSPITAL_COMMUNITY)
Admission: RE | Disposition: A | Discharge status: Home / Self Care | Payer: Self-pay | Source: Ambulatory Visit | Attending: General Surgery

## 2012-10-05 ENCOUNTER — Observation Stay (HOSPITAL_COMMUNITY)
Admission: RE | Admit: 2012-10-05 | Discharge: 2012-10-07 | Disposition: A | Discharge status: Home / Self Care | Payer: Medicare HMO | Source: Ambulatory Visit | Attending: General Surgery | Admitting: General Surgery

## 2012-10-05 DIAGNOSIS — R590 Localized enlarged lymph nodes: Secondary | ICD-10-CM | POA: Diagnosis present

## 2012-10-05 DIAGNOSIS — C8583 Other specified types of non-Hodgkin lymphoma, intra-abdominal lymph nodes: Principal | ICD-10-CM | POA: Insufficient documentation

## 2012-10-05 DIAGNOSIS — I10 Essential (primary) hypertension: Secondary | ICD-10-CM | POA: Insufficient documentation

## 2012-10-05 DIAGNOSIS — R599 Enlarged lymph nodes, unspecified: Secondary | ICD-10-CM | POA: Insufficient documentation

## 2012-10-05 DIAGNOSIS — Z860100 Personal history of colon polyps, unspecified: Secondary | ICD-10-CM | POA: Insufficient documentation

## 2012-10-05 DIAGNOSIS — Z01812 Encounter for preprocedural laboratory examination: Secondary | ICD-10-CM | POA: Insufficient documentation

## 2012-10-05 DIAGNOSIS — R339 Retention of urine, unspecified: Secondary | ICD-10-CM | POA: Insufficient documentation

## 2012-10-05 DIAGNOSIS — K824 Cholesterolosis of gallbladder: Secondary | ICD-10-CM | POA: Insufficient documentation

## 2012-10-05 DIAGNOSIS — Z01818 Encounter for other preprocedural examination: Secondary | ICD-10-CM | POA: Insufficient documentation

## 2012-10-05 DIAGNOSIS — K8010 Calculus of gallbladder with chronic cholecystitis without obstruction: Secondary | ICD-10-CM | POA: Insufficient documentation

## 2012-10-05 DIAGNOSIS — K660 Peritoneal adhesions (postprocedural) (postinfection): Secondary | ICD-10-CM | POA: Insufficient documentation

## 2012-10-05 DIAGNOSIS — K8020 Calculus of gallbladder without cholecystitis without obstruction: Secondary | ICD-10-CM | POA: Diagnosis present

## 2012-10-05 HISTORY — PX: LYMPH NODE BIOPSY: SHX201

## 2012-10-05 HISTORY — PX: CHOLECYSTECTOMY: SHX55

## 2012-10-05 SURGERY — LAPAROSCOPIC CHOLECYSTECTOMY WITH INTRAOPERATIVE CHOLANGIOGRAM
Anesthesia: General | Site: Abdomen | Wound class: Clean Contaminated

## 2012-10-05 MED ORDER — MIDAZOLAM HCL 5 MG/5ML IJ SOLN
INTRAMUSCULAR | Status: DC | PRN
  Administered 2012-10-05: 2 mg via INTRAVENOUS

## 2012-10-05 MED ORDER — HYDROMORPHONE HCL PF 1 MG/ML IJ SOLN
0.2500 mg | INTRAMUSCULAR | Status: DC | PRN
  Administered 2012-10-05 (×5): 0.5 mg via INTRAVENOUS

## 2012-10-05 MED ORDER — FAMOTIDINE 20 MG PO TABS
20.0000 mg | ORAL_TABLET | Freq: Every day | ORAL | Status: DC
  Administered 2012-10-07: 20 mg via ORAL
  Filled 2012-10-05 (×3): qty 1

## 2012-10-05 MED ORDER — LACTATED RINGERS IV SOLN
INTRAVENOUS | Status: DC | PRN
  Administered 2012-10-05 (×3): via INTRAVENOUS

## 2012-10-05 MED ORDER — FENTANYL CITRATE 0.05 MG/ML IJ SOLN
INTRAMUSCULAR | Status: AC
  Administered 2012-10-05: 100 ug via INTRAVENOUS
  Filled 2012-10-05: qty 2

## 2012-10-05 MED ORDER — TRAMADOL HCL 50 MG PO TABS
50.0000 mg | ORAL_TABLET | Freq: Three times a day (TID) | ORAL | Status: DC | PRN
  Administered 2012-10-06 – 2012-10-07 (×3): 50 mg via ORAL
  Filled 2012-10-05 (×3): qty 1

## 2012-10-05 MED ORDER — EPHEDRINE SULFATE 50 MG/ML IJ SOLN
INTRAMUSCULAR | Status: DC | PRN
  Administered 2012-10-05: 5 mg via INTRAVENOUS
  Administered 2012-10-05: 15 mg via INTRAVENOUS

## 2012-10-05 MED ORDER — HYDROMORPHONE HCL PF 1 MG/ML IJ SOLN
0.5000 mg | INTRAMUSCULAR | Status: DC | PRN
  Administered 2012-10-05 – 2012-10-06 (×4): 1 mg via INTRAVENOUS
  Filled 2012-10-05 (×2): qty 1

## 2012-10-05 MED ORDER — SODIUM CHLORIDE 0.9 % IR SOLN
Status: DC | PRN
  Administered 2012-10-05: 2000 mL

## 2012-10-05 MED ORDER — PANTOPRAZOLE SODIUM 40 MG PO TBEC
40.0000 mg | DELAYED_RELEASE_TABLET | Freq: Every day | ORAL | Status: DC
  Administered 2012-10-06: 40 mg via ORAL
  Filled 2012-10-05 (×2): qty 1

## 2012-10-05 MED ORDER — GABAPENTIN 300 MG PO CAPS
300.0000 mg | ORAL_CAPSULE | Freq: Every day | ORAL | Status: DC | PRN
  Filled 2012-10-05: qty 1

## 2012-10-05 MED ORDER — ONDANSETRON HCL 4 MG PO TABS: 4.0000 mg | ORAL_TABLET | Freq: Four times a day (QID) | ORAL | Status: DC | PRN

## 2012-10-05 MED ORDER — HYDROMORPHONE HCL PF 1 MG/ML IJ SOLN
INTRAMUSCULAR | Status: AC
  Filled 2012-10-05: qty 1

## 2012-10-05 MED ORDER — BEPOTASTINE BESILATE 1.5 % OP SOLN: 1.0000 [drp] | Freq: Every day | OPHTHALMIC | Status: DC | PRN

## 2012-10-05 MED ORDER — HYDROCODONE-ACETAMINOPHEN 5-325 MG PO TABS
ORAL_TABLET | ORAL | Status: AC
  Filled 2012-10-05: qty 1

## 2012-10-05 MED ORDER — BUPIVACAINE-EPINEPHRINE PF 0.25-1:200000 % IJ SOLN
INTRAMUSCULAR | Status: AC
  Filled 2012-10-05: qty 30

## 2012-10-05 MED ORDER — SODIUM CHLORIDE 0.9 % IV SOLN
INTRAVENOUS | Status: DC | PRN
  Administered 2012-10-05: 16:00:00

## 2012-10-05 MED ORDER — POTASSIUM CHLORIDE IN NACL 20-0.9 MEQ/L-% IV SOLN
INTRAVENOUS | Status: DC
  Administered 2012-10-05: 23:00:00 via INTRAVENOUS
  Filled 2012-10-05 (×6): qty 1000

## 2012-10-05 MED ORDER — AMITRIPTYLINE HCL 10 MG PO TABS
10.0000 mg | ORAL_TABLET | Freq: Every day | ORAL | Status: DC
Start: 2012-10-05 — End: 2012-10-07
  Administered 2012-10-05 – 2012-10-06 (×2): 10 mg via ORAL
  Filled 2012-10-05 (×3): qty 1

## 2012-10-05 MED ORDER — BUPIVACAINE HCL (PF) 0.25 % IJ SOLN
INTRAMUSCULAR | Status: AC
  Filled 2012-10-05: qty 30

## 2012-10-05 MED ORDER — LIDOCAINE HCL (CARDIAC) 20 MG/ML IV SOLN
INTRAVENOUS | Status: DC | PRN
  Administered 2012-10-05: 100 mg via INTRAVENOUS

## 2012-10-05 MED ORDER — FENTANYL CITRATE 0.05 MG/ML IJ SOLN
100.0000 ug | Freq: Once | INTRAMUSCULAR | Status: AC
  Filled 2012-10-05: qty 2

## 2012-10-05 MED ORDER — AMLODIPINE BESYLATE 5 MG PO TABS
5.0000 mg | ORAL_TABLET | Freq: Every day | ORAL | Status: DC
Start: 2012-10-05 — End: 2012-10-07
  Administered 2012-10-06 – 2012-10-07 (×2): 5 mg via ORAL
  Filled 2012-10-05 (×3): qty 1

## 2012-10-05 MED ORDER — 0.9 % SODIUM CHLORIDE (POUR BTL) OPTIME
TOPICAL | Status: DC | PRN
  Administered 2012-10-05: 1000 mL

## 2012-10-05 MED ORDER — ACETAMINOPHEN 325 MG PO TABS
ORAL_TABLET | ORAL | Status: AC
  Filled 2012-10-05: qty 1

## 2012-10-05 MED ORDER — OXYCODONE HCL 5 MG PO TABS: 5.0000 mg | ORAL_TABLET | Freq: Once | ORAL | Status: DC | PRN

## 2012-10-05 MED ORDER — OXYCODONE HCL 5 MG PO TABS
ORAL_TABLET | ORAL | Status: AC
  Administered 2012-10-05: 5 mg
  Filled 2012-10-05: qty 1

## 2012-10-05 MED ORDER — HYDROMORPHONE HCL PF 1 MG/ML IJ SOLN
INTRAMUSCULAR | Status: AC
Start: 2012-10-05 — End: 2012-10-06
  Filled 2012-10-05: qty 1

## 2012-10-05 MED ORDER — HYDROCODONE-ACETAMINOPHEN 5-325 MG PO TABS
1.0000 | ORAL_TABLET | ORAL | Status: DC | PRN
  Administered 2012-10-05: 1 via ORAL

## 2012-10-05 MED ORDER — OXYCODONE HCL 5 MG/5ML PO SOLN: 5.0000 mg | Freq: Once | ORAL | Status: DC | PRN

## 2012-10-05 MED ORDER — PROPOFOL 10 MG/ML IV BOLUS
INTRAVENOUS | Status: DC | PRN
  Administered 2012-10-05: 50 mg via INTRAVENOUS
  Administered 2012-10-05: 150 mg via INTRAVENOUS

## 2012-10-05 MED ORDER — GLYCOPYRROLATE 0.2 MG/ML IJ SOLN
INTRAMUSCULAR | Status: DC | PRN
  Administered 2012-10-05: 0.4 mg via INTRAVENOUS

## 2012-10-05 MED ORDER — MEPERIDINE HCL 25 MG/ML IJ SOLN: 6.2500 mg | INTRAMUSCULAR | Status: DC | PRN

## 2012-10-05 MED ORDER — PROMETHAZINE HCL 25 MG/ML IJ SOLN: 6.2500 mg | INTRAMUSCULAR | Status: DC | PRN

## 2012-10-05 MED ORDER — LACTATED RINGERS IV SOLN
INTRAVENOUS | Status: DC
  Administered 2012-10-05: 13:00:00 via INTRAVENOUS

## 2012-10-05 MED ORDER — OXYCODONE-ACETAMINOPHEN 5-325 MG PO TABS
1.0000 | ORAL_TABLET | ORAL | Status: DC | PRN
  Administered 2012-10-06: 1 via ORAL
  Administered 2012-10-06 – 2012-10-07 (×3): 2 via ORAL
  Administered 2012-10-07: 1 via ORAL
  Filled 2012-10-05: qty 1
  Filled 2012-10-05 (×4): qty 2

## 2012-10-05 MED ORDER — ROCURONIUM BROMIDE 100 MG/10ML IV SOLN
INTRAVENOUS | Status: DC | PRN
  Administered 2012-10-05: 10 mg via INTRAVENOUS
  Administered 2012-10-05: 50 mg via INTRAVENOUS

## 2012-10-05 MED ORDER — NEOSTIGMINE METHYLSULFATE 1 MG/ML IJ SOLN
INTRAMUSCULAR | Status: DC | PRN
  Administered 2012-10-05: 3 mg via INTRAVENOUS

## 2012-10-05 MED ORDER — ONDANSETRON HCL 4 MG/2ML IJ SOLN
INTRAMUSCULAR | Status: DC | PRN
  Administered 2012-10-05: 4 mg via INTRAVENOUS

## 2012-10-05 MED ORDER — ONDANSETRON HCL 4 MG/2ML IJ SOLN: 4.0000 mg | Freq: Four times a day (QID) | INTRAMUSCULAR | Status: DC | PRN

## 2012-10-05 MED ORDER — FENTANYL CITRATE 0.05 MG/ML IJ SOLN
INTRAMUSCULAR | Status: DC | PRN
  Administered 2012-10-05 (×3): 50 ug via INTRAVENOUS

## 2012-10-05 MED ORDER — LEVOTHYROXINE SODIUM 75 MCG PO TABS
75.0000 ug | ORAL_TABLET | Freq: Every day | ORAL | Status: DC
  Administered 2012-10-06 – 2012-10-07 (×2): 75 ug via ORAL
  Filled 2012-10-05 (×3): qty 1

## 2012-10-05 MED ORDER — PHENAZOPYRIDINE HCL 100 MG PO TABS
100.0000 mg | ORAL_TABLET | Freq: Three times a day (TID) | ORAL | Status: DC | PRN
  Administered 2012-10-06 – 2012-10-07 (×2): 100 mg via ORAL
  Filled 2012-10-05 (×2): qty 1

## 2012-10-05 MED ORDER — BUPIVACAINE-EPINEPHRINE 0.25% -1:200000 IJ SOLN
INTRAMUSCULAR | Status: DC | PRN
  Administered 2012-10-05: 20 mL

## 2012-10-05 MED ORDER — HEPARIN SODIUM (PORCINE) 5000 UNIT/ML IJ SOLN
5000.0000 [IU] | Freq: Three times a day (TID) | INTRAMUSCULAR | Status: DC
  Filled 2012-10-05 (×6): qty 1

## 2012-10-05 SURGICAL SUPPLY — 56 items
ADH SKN CLS APL DERMABOND .7 (GAUZE/BANDAGES/DRESSINGS) ×1
APPLIER CLIP ROT 10 11.4 M/L (STAPLE) ×2
BLADE SURG 15 STRL LF DISP TIS (BLADE) ×1 IMPLANT
BLADE SURG 15 STRL SS (BLADE) ×1
CANISTER SUCTION 2500CC (MISCELLANEOUS) ×2 IMPLANT
CHLORAPREP W/TINT 26ML (MISCELLANEOUS) ×2 IMPLANT
CLIP APPLIE ROT 10 11.4 M/L (STAPLE) ×1 IMPLANT
CLOTH BEACON ORANGE TIMEOUT ST (SAFETY) ×2 IMPLANT
CONT SPEC 4OZ CLIKSEAL STRL BL (MISCELLANEOUS) ×2 IMPLANT
COVER MAYO STAND STRL (DRAPES) ×2 IMPLANT
COVER SURGICAL LIGHT HANDLE (MISCELLANEOUS) ×2 IMPLANT
DECANTER SPIKE VIAL GLASS SM (MISCELLANEOUS) ×4 IMPLANT
DERMABOND ADHESIVE PROPEN (GAUZE/BANDAGES/DRESSINGS) ×1
DERMABOND ADVANCED (GAUZE/BANDAGES/DRESSINGS) ×1
DERMABOND ADVANCED .7 DNX12 (GAUZE/BANDAGES/DRESSINGS) ×1 IMPLANT
DERMABOND ADVANCED .7 DNX6 (GAUZE/BANDAGES/DRESSINGS) ×1 IMPLANT
DRAPE C-ARM 42X72 X-RAY (DRAPES) ×2 IMPLANT
DRAPE PED LAPAROTOMY (DRAPES) ×2 IMPLANT
DRAPE UTILITY 15X26 W/TAPE STR (DRAPE) ×4 IMPLANT
ELECT REM PT RETURN 9FT ADLT (ELECTROSURGICAL) ×2
ELECTRODE REM PT RTRN 9FT ADLT (ELECTROSURGICAL) ×1 IMPLANT
GLOVE BIOGEL PI IND STRL 6 (GLOVE) ×3 IMPLANT
GLOVE BIOGEL PI IND STRL 8 (GLOVE) ×1 IMPLANT
GLOVE BIOGEL PI INDICATOR 6 (GLOVE) ×3
GLOVE BIOGEL PI INDICATOR 8 (GLOVE) ×1
GLOVE ECLIPSE 7.0 STRL STRAW (GLOVE) ×2 IMPLANT
GLOVE ECLIPSE 8.0 STRL XLNG CF (GLOVE) ×2 IMPLANT
GLOVE EUDERMIC 7 POWDERFREE (GLOVE) ×2 IMPLANT
GLOVE SURG SS PI 6.0 STRL IVOR (GLOVE) ×2 IMPLANT
GLOVE SURG SS PI 6.5 STRL IVOR (GLOVE) ×2 IMPLANT
GOWN PREVENTION PLUS XLARGE (GOWN DISPOSABLE) ×2 IMPLANT
GOWN STRL NON-REIN LRG LVL3 (GOWN DISPOSABLE) ×8 IMPLANT
KIT BASIN OR (CUSTOM PROCEDURE TRAY) ×2 IMPLANT
KIT ROOM TURNOVER OR (KITS) ×2 IMPLANT
NEEDLE HYPO 25GX1X1/2 BEV (NEEDLE) ×2 IMPLANT
NS IRRIG 1000ML POUR BTL (IV SOLUTION) ×4 IMPLANT
PACK SURGICAL SETUP 50X90 (CUSTOM PROCEDURE TRAY) ×2 IMPLANT
PAD ARMBOARD 7.5X6 YLW CONV (MISCELLANEOUS) ×2 IMPLANT
PENCIL BUTTON HOLSTER BLD 10FT (ELECTRODE) ×2 IMPLANT
POUCH SPECIMEN RETRIEVAL 10MM (ENDOMECHANICALS) ×4 IMPLANT
SCALPEL HARMONIC ACE (MISCELLANEOUS) ×2 IMPLANT
SET CHOLANGIOGRAPH 5 50 .035 (SET/KITS/TRAYS/PACK) ×2 IMPLANT
SET IRRIG TUBING LAPAROSCOPIC (IRRIGATION / IRRIGATOR) ×2 IMPLANT
SLEEVE ENDOPATH XCEL 5M (ENDOMECHANICALS) ×2 IMPLANT
SPECIMEN JAR SMALL (MISCELLANEOUS) ×2 IMPLANT
SPONGE LAP 4X18 X RAY DECT (DISPOSABLE) ×4 IMPLANT
SUT MNCRL AB 4-0 PS2 18 (SUTURE) ×2 IMPLANT
SUT MON AB 4-0 PC3 18 (SUTURE) ×2 IMPLANT
SUT VIC AB 3-0 SH 18 (SUTURE) ×2 IMPLANT
SYR CONTROL 10ML LL (SYRINGE) ×2 IMPLANT
TOWEL OR 17X24 6PK STRL BLUE (TOWEL DISPOSABLE) ×2 IMPLANT
TOWEL OR 17X26 10 PK STRL BLUE (TOWEL DISPOSABLE) ×2 IMPLANT
TRAY LAPAROSCOPIC (CUSTOM PROCEDURE TRAY) ×2 IMPLANT
TROCAR XCEL BLUNT TIP 100MML (ENDOMECHANICALS) ×2 IMPLANT
TROCAR XCEL NON-BLD 11X100MML (ENDOMECHANICALS) ×2 IMPLANT
TROCAR XCEL NON-BLD 5MMX100MML (ENDOMECHANICALS) ×2 IMPLANT

## 2012-10-05 NOTE — Anesthesia Preprocedure Evaluation (Addendum)
Anesthesia Evaluation  Patient identified by MRN, date of birth, ID band Patient awake    Reviewed: Allergy & Precautions, H&P , NPO status , Patient's Chart, lab work & pertinent test results  History of Anesthesia Complications Negative for: history of anesthetic complications  Airway Mallampati: II  Neck ROM: Full    Dental  (+) Teeth Intact and Dental Advisory Given   Pulmonary neg pulmonary ROS,  breath sounds clear to auscultation        Cardiovascular hypertension, Pt. on medications Rhythm:Regular Rate:Normal     Neuro/Psych  Headaches,    GI/Hepatic hiatal hernia, PUD, GERD-  Medicated and Controlled,Dysphagia. Esophagitis Hx. Mesenteric lymphadenopathy   Endo/Other  Hypothyroidism   Renal/GU      Musculoskeletal  (+) Fibromyalgia -  Abdominal   Peds  Hematology  (+) Blood dyscrasia, ,   Anesthesia Other Findings   Reproductive/Obstetrics                         Anesthesia Physical Anesthesia Plan  ASA: II  Anesthesia Plan: General   Post-op Pain Management:    Induction: Intravenous  Airway Management Planned: Oral ETT  Additional Equipment:   Intra-op Plan:   Post-operative Plan: Extubation in OR  Informed Consent: I have reviewed the patients History and Physical, chart, labs and discussed the procedure including the risks, benefits and alternatives for the proposed anesthesia with the patient or authorized representative who has indicated his/her understanding and acceptance.   Dental advisory given  Plan Discussed with: CRNA and Surgeon  Anesthesia Plan Comments:         Anesthesia Quick Evaluation

## 2012-10-05 NOTE — Preoperative (Signed)
Beta Blockers   Reason not to administer Beta Blockers:Not Applicable 

## 2012-10-05 NOTE — Progress Notes (Signed)
Dr Randa Evens states pt may have an additonal .5 mg dilaudid.

## 2012-10-05 NOTE — Anesthesia Postprocedure Evaluation (Signed)
  Anesthesia Post-op Note  Patient: Amanda Kane  Procedure(s) Performed: Procedure(s): LAPAROSCOPIC CHOLECYSTECTOMY WITH INTRAOPERATIVE CHOLANGIOGRAM (N/A)  LAPAROSCOPIC EXCISIONAL BIOPSY OF MESENTERIC LYMPH NODE  (N/A)  Patient Location: PACU  Anesthesia Type:General  Level of Consciousness: awake  Airway and Oxygen Therapy: Patient Spontanous Breathing  Post-op Pain: mild  Post-op Assessment: Post-op Vital signs reviewed  Post-op Vital Signs: Reviewed  Complications: No apparent anesthesia complications

## 2012-10-05 NOTE — Op Note (Signed)
Patient Name:           Amanda Kane   Date of Surgery:        10/05/2012  Pre op Diagnosis:      Progressive mesenteric adenopathy, rule out lymphoma. Chronic cholecystitis with cholelithiasis  Post op Diagnosis:    Same  Procedure:                 Laparoscopic excisional biopsy of small bowel mesenteric lymph node, laparoscopic cholecystectomy with cholangiogram  Surgeon:                     Angelia Mould. Derrell Lolling, M.D., FACS  Assistant:                      Avel Peace, M.D., FACS  Operative Indications:   Amanda Kane is a 72 y.o. female. She is referred to me by Dr. Jethro Bolus, for evaluation of abdominal mesenteric adenopathy. She also has abdominal pain. Dr. Enos Fling is her primary care physician. Dr. McDiarmid is her urologist.  This patient has a long history of adenomatous colon polyps. She states that she had a premalignant polyp removed in 1993 but did not require surgical intervention. She has been followed more recently Dr. Melvia Heaps. Recent upper endoscopy and colonoscopy were grossly normal except for a slight GE junction stricture and the small adenomatous cecal polyp which was a benign adenoma. There was no malignancy seen. She's been treated as an outpatient for diverticulitis takes Metamucil daily; her bowels are regulated. S  She was found to have some mesenteric adenopathy 2 or 3 years ago on CT scan. Pelvic ultrasound was normal. Recent CT scan of the abdomen and pelvis on 08/30/2012 shows gallstones but no gallbladder inflammation. There is further progression and mesenteric lymphadenopathy in the small bowel mesentery, mostly distal small bowel mesentery and ileocolic area. There are no enlarged pelvic or inguinal lymph nodes. Uterus ovaries and bladder looked normal. Diverticular changes of the colon noted. She has been followed for this adenopathy Dr. Gaylyn Rong for a year. He now referred her to me because of progression radiographically of the adenopathy..  Associated with this  is a history of abdominal pain. She states she has had stomach problems for many years. This used to just be reflux symptoms. She subsequently had outpatient management for diverticulitis. For the past 6 months she notices abdominal fullness and pain under her rib cages. She says she gets epigastric and substernal pain after eating. She is intolerant of fatty foods. No change in her bowel movements.Hepatobiliary scan reveals delayed and reduced ejection fraction, and her symptoms were reproduced by CCK infusion. ACE level was normal and so Dr. Gaylyn Rong does not think she has sarcoidosis. She is brought to the operating room for a diagnostic laparoscopy, laparoscopic biopsy of mesenteric lymph node, laparoscopic cholecystectomy   Operative Findings:       There were a few enlarged lymph nodes in the ileal mesentery. We excised a representative lymph node. Pathology stated this was lymphoid tissue and that they had adequate tissue for lymphoma workup. The gallbladder was severely, chronically inflamed. There were extensive adhesions of the omentum to the gallbladder was friable. The cholangiogram was normal, showing normal intrahepatic and extrahepatic biliary anatomy, no filling defect, no dilatation, and no stricture with good flow of contrast into the duodenum. Otherwise the liver, stomach, small bowel, large bowel, looked normal.  Procedure in Detail:  Following the induction of general endotracheal anesthesia, a Foley catheter was inserted. The patient was positioned with her arms tucked at her sides. The abdomen was prepped and draped in a sterile fashion. Intravenous antibiotics were given. Surgical time out was performed. 0.5% Marcaine with epinephrine was used for local infiltration anesthetic.  An 11 millimeter Hassan trocar was inserted in the infraumbilical position with an open technique. This was secured with a pursestring suture of 0 Vicryl. Pneumoperitoneum was created. A 5 mm trocar was  placed in the left lower quadrant, two 5 mm trocars the right upper quadrant and an 11 mm trocar in the subxiphoid region.  We first explored the ileocolic mesentery. There were a lot of adhesions of the terminal ileum and ileal cecal valve and cecum to the right lower abdominal wall from previous appendectomy. We took down some omental adhesions which allowed [place] to reflect the omentum up and out of the way. We ran the small bowel retrograde; we found several areas where there were some enlarged lymph nodes about 1.5 cm in diameter. We chose one of these areas which seemed representative. Using the harmonic scalpel I incised the peritoneum circumferentially around this and mobilized it up and out of the mesentery. I controlled its mesenteric blood supply with the harmonic scalpel. The lymph node was placed in a specimen bag and removed. It was sent to the lab. Dr. Adolphus Birchwood of pathology said he had plenty of tissue for lymphoma workup and that this was a lymph node. The surgical field  was irrigated. It was very hemostatic. Nothing further needed to be done there.  We placed the patient in reverse Trendelenburg position. We identified the fundus of the gallbladder and lifted that up. We slowly took the omental adhesions down off of the gallbladder. We dissected out the infundibulum of the gallbladder. We dissected out the cystic artery and cystic duct circumferentially until we had two and only two structures going to the gallbladder. The cystic artery was secured with multiple  metal clips and divided. A cholangiogram catheter was inserted into the cystic duct and a cholangiogram obtained with the C-arm. The cholangiogram was normal as described above. The cholangiocatheter was removed, the cystic duct was secured with multiple metal clips and divided. The gallbladder was dissected from its liver bed with electrocautery, placed in a specimen bag,  and removed. The liver bed was a little bit raw, but was  cauterized and became completely hemostatic.  Marland Kitchen At the completion of the case we thoroughly irrigated the subhepatic and subphrenic spaces and irrigation fluid was completely clear. There was no bleeding or bile leak.  We checked the small bowel mesentery one more time and there was no bleeding. We removed the trocars and there was no bleeding the trocar sites. The pneumoperitoneum was released. The fascia at the umbilicus was closed with 0 Vicryl sutures. The skin incisions were closed with subcuticular sutures of 4-0 Monocryl and Dermabond. Patient tolerated procedure well was taken to recovery room in stable condition. EBL 25 cc. Counts correct. Complications none.     Angelia Mould. Derrell Lolling, M.D., FACS General and Minimally Invasive Surgery Breast and Colorectal Surgery  10/05/2012 4:15 PM

## 2012-10-05 NOTE — Progress Notes (Signed)
Bedside report received from Quinn Axe RN.  Pt. Arousable.  Pt. No signs of pain.  When asked for pain level, pt did not respond.  Pt. Without focal neurological deficit. Pt. Nods yes/no to questions.

## 2012-10-05 NOTE — Interval H&P Note (Signed)
History and Physical Interval Note:  10/05/2012 1:57 PM  Amanda Kane  has presented today for surgery, with the diagnosis of Gallstones and mesenteric adenopathy.  The goals and the various methods of treatment have been discussed with the patient and family. After consideration of risks, benefits and other options for treatment, the patient has consented to  Procedure(s): LAPAROSCOPIC CHOLECYSTECTOMY WITH INTRAOPERATIVE CHOLANGIOGRAM (N/A)  LAPAROSCOPIC LYMPH NODE BIOPSY (N/A) as a surgical intervention .  The patient's history has been reviewed, patient examined, no change in status, stable for surgery.  I have reviewed the patient's chart and labs.  Questions were answered to the patient's satisfaction.     [name]

## 2012-10-05 NOTE — Progress Notes (Signed)
Pt very sleepy upon arrival to rm.  Pt brought back to pacu for further recovery.  Family updated.

## 2012-10-05 NOTE — Transfer of Care (Signed)
Immediate Anesthesia Transfer of Care Note  Patient: Amanda Kane  Procedure(s) Performed: Procedure(s): LAPAROSCOPIC CHOLECYSTECTOMY WITH INTRAOPERATIVE CHOLANGIOGRAM (N/A)  LAPAROSCOPIC EXCISIONAL BIOPSY OF MESENTERIC LYMPH NODE  (N/A)  Patient Location: PACU  Anesthesia Type:General  Level of Consciousness: awake, alert  and oriented  Airway & Oxygen Therapy: Patient Spontanous Breathing and Patient connected to nasal cannula oxygen  Post-op Assessment: Report given to PACU RN and Post -op Vital signs reviewed and stable  Post vital signs: Reviewed and stable  Complications: No apparent anesthesia complications

## 2012-10-06 ENCOUNTER — Encounter (HOSPITAL_COMMUNITY): Payer: Self-pay | Admitting: General Surgery

## 2012-10-06 LAB — COMPREHENSIVE METABOLIC PANEL
ALT: 41 U/L — ABNORMAL HIGH (ref 0–35)
AST: 48 U/L — ABNORMAL HIGH (ref 0–37)
Albumin: 3 g/dL — ABNORMAL LOW (ref 3.5–5.2)
Chloride: 105 mEq/L (ref 96–112)
Creatinine, Ser: 0.62 mg/dL (ref 0.50–1.10)
Potassium: 4.1 mEq/L (ref 3.5–5.1)
Sodium: 140 mEq/L (ref 135–145)
Total Bilirubin: 0.4 mg/dL (ref 0.3–1.2)

## 2012-10-06 LAB — URINALYSIS, ROUTINE W REFLEX MICROSCOPIC
Glucose, UA: NEGATIVE mg/dL
Ketones, ur: NEGATIVE mg/dL
Leukocytes, UA: NEGATIVE
pH: 6.5 (ref 5.0–8.0)

## 2012-10-06 LAB — CBC
MCV: 84.3 fL (ref 78.0–100.0)
Platelets: 281 10*3/uL (ref 150–400)
RDW: 16.6 % — ABNORMAL HIGH (ref 11.5–15.5)
WBC: 13.2 10*3/uL — ABNORMAL HIGH (ref 4.0–10.5)

## 2012-10-06 NOTE — Progress Notes (Signed)
In and out cath, 750 ml out

## 2012-10-06 NOTE — Progress Notes (Signed)
1 Day Post-Op  Subjective: Alert and stable. Unable to void more than tiny amounts. No nausea. C/o lower abd discomfort.  Ambulated to BR.  Objective: Vital signs in last 24 hours: Temp:  [97.1 F (36.2 C)-98.4 F (36.9 C)] 97.7 F (36.5 C) (05/23 0500) Pulse Rate:  [72-98] 93 (05/23 0500) Resp:  [7-18] 18 (05/23 0500) BP: (122-144)/(51-82) 141/67 mmHg (05/23 0500) SpO2:  [94 %-100 %] 98 % (05/23 0500) Last BM Date: 10/05/12  Intake/Output from previous day: 05/22 0701 - 05/23 0700 In: 2285 [P.O.:60; I.V.:2225] Out: 335 [Urine:285; Blood:50] Intake/Output this shift:    General appearance: alert, anxious, no physical distress. Resp: clear bilaterally GI: soft and benign in upper abdomen. Full and uncomfortable in suprapubic area. BS present  Wounds OK. Lab Results:  No results found for this or any previous visit (from the past 24 hour(s)).   Studies/Results: @RISRSLT24 @  . acetaminophen      . amitriptyline  10 mg Oral QHS  . amLODipine  5 mg Oral Daily  . famotidine  20 mg Oral Daily  . heparin subcutaneous  5,000 Units Subcutaneous Q8H  . levothyroxine  75 mcg Oral QAC breakfast  . pantoprazole  40 mg Oral Daily     Assessment/Plan: s/p Procedure(s): LAPAROSCOPIC CHOLECYSTECTOMY WITH INTRAOPERATIVE CHOLANGIOGRAM  LAPAROSCOPIC EXCISIONAL BIOPSY OF MESENTERIC LYMPH NODE  POD#1. Stable Suspect urinary retention. To cath now. Check labs. May be able to go home later today. Tomorrow more likely. Ambulate. Inc. spirometry  @PROBHOSP @  LOS: 1 day    Wendle Kina M. Derrell Lolling, M.D., [facility] General and Minimally invasive Surgery Breast and Colorectal Surgery Office:   [phone] Pager:   [phone]  10/06/2012  . .prob

## 2012-10-07 MED ORDER — OXYCODONE-ACETAMINOPHEN 5-325 MG PO TABS: 1.0000 | ORAL_TABLET | ORAL | Status: DC | PRN

## 2012-10-07 NOTE — Progress Notes (Signed)
Home today

## 2012-10-07 NOTE — Progress Notes (Signed)
Patient ID: Amanda Kane, female   DOB: 08-21-1940, 72 y.o.   MRN: [id number] 2 Days Post-Op  Subjective: Pt feels better today.  Able to urinate on her own.  Pain is controlled.  Tolerating some foods, some cause crampy pains  Objective: Vital signs in last 24 hours: Temp:  [98.8 F (37.1 C)-99.1 F (37.3 C)] 98.8 F (37.1 C) (05/24 0545) Pulse Rate:  [92-103] 92 (05/24 0545) Resp:  [16-19] 19 (05/24 0545) BP: (138-153)/(67-90) 153/67 mmHg (05/24 0545) SpO2:  [95 %-97 %] 97 % (05/24 0545) Last BM Date: 10/05/12  Intake/Output from previous day: 05/23 0701 - 05/24 0700 In: 540 [P.O.:240; I.V.:300] Out: 325 [Urine:325] Intake/Output this shift:    PE: Abd: soft, appropriately tender, all incisions c/d/i, +BS, ND  Lab Results:   Recent Labs  10/04/12 1505 10/06/12 0835  WBC 11.9* 13.2*  HGB 12.8 10.6*  HCT 39.8 33.7*  PLT 326 281   BMET  Recent Labs  10/04/12 1505 10/06/12 0835  NA 142 140  K 3.8 4.1  CL 104 105  CO2 27 25  GLUCOSE 102* 92  BUN 15 8  CREATININE 0.71 0.62  CALCIUM 9.7 8.7   PT/INR No results found for this basename: LABPROT, INR,  in the last 72 hours CMP     Component Value Date/Time   NA 140 10/06/2012 0835   NA 142 08/30/2012 0918   K 4.1 10/06/2012 0835   K 4.5 08/30/2012 0918   CL 105 10/06/2012 0835   CL 106 08/30/2012 0918   CO2 25 10/06/2012 0835   CO2 27 08/30/2012 0918   GLUCOSE 92 10/06/2012 0835   GLUCOSE 100* 08/30/2012 0918   BUN 8 10/06/2012 0835   BUN 13.2 08/30/2012 0918   CREATININE 0.62 10/06/2012 0835   CREATININE 0.8 08/30/2012 0918   CALCIUM 8.7 10/06/2012 0835   CALCIUM 9.1 08/30/2012 0918   PROT 5.7* 10/06/2012 0835   PROT 6.8 08/30/2012 0918   ALBUMIN 3.0* 10/06/2012 0835   ALBUMIN 3.6 08/30/2012 0918   AST 48* 10/06/2012 0835   AST 21 08/30/2012 0918   ALT 41* 10/06/2012 0835   ALT 21 08/30/2012 0918   ALKPHOS 73 10/06/2012 0835   ALKPHOS 86 08/30/2012 0918   BILITOT 0.4 10/06/2012 0835   BILITOT 0.44 08/30/2012 0918   GFRNONAA 90* 10/06/2012 0835   GFRAA >90 10/06/2012 0835   Lipase     Component Value Date/Time   LIPASE 16 10/06/2012 0835       Studies/Results: Dg Cholangiogram Operative  10/05/2012   *RADIOLOGY REPORT*  Clinical Data: Cholelithiasis.  INTRAOPERATIVE CHOLANGIOGRAM  Technique:  Multiple fluoroscopic spot radiographs were obtained during intraoperative cholangiogram and are submitted for interpretation post-operatively.  Comparison: None.  Findings: No persistent filling defects in the common duct. Intrahepatic ducts are incompletely visualized, appearing decompressed centrally. Contrast passes into the duodenum.  IMPRESSION  Negative for retained common duct stone.   Original Report Authenticated By: D. Andria Rhein, MD    Anti-infectives: Anti-infectives   Start     Dose/Rate Route Frequency Ordered Stop   10/05/12 0600  ceFAZolin (ANCEF) IVPB 2 g/50 mL premix     2 g 100 mL/hr over 30 Minutes Intravenous On call to O.R. 10/04/12 1409 10/05/12 1403       Assessment/Plan  1. .s/p lap chole with mesenteric LN bx 2. Urinary retention, resolved  Plan: 1. May dc home today. 2. Follow up with Dr. Gaylyn Rong and Dr. Derrell Lolling     LOS:  2 days    [name] E 10/07/2012, 10:31 AM Pager: [phone]

## 2012-10-08 ENCOUNTER — Encounter (INDEPENDENT_AMBULATORY_CARE_PROVIDER_SITE_OTHER): Payer: Self-pay | Admitting: General Surgery

## 2012-10-08 LAB — URINE CULTURE: Colony Count: NO GROWTH

## 2012-10-10 ENCOUNTER — Encounter: Payer: Self-pay | Admitting: Gastroenterology

## 2012-10-10 ENCOUNTER — Telehealth (INDEPENDENT_AMBULATORY_CARE_PROVIDER_SITE_OTHER): Payer: Self-pay | Admitting: *Deleted

## 2012-10-10 NOTE — Telephone Encounter (Signed)
Patient called to state that she is having left lower quadrant pain that increases with movement.  Patient states she has had one formed stool since surgery on 10/05/12 other than that it has been 3-4 loose stools per day.  Patient believes that this is a flair up of her diverticulitis but wants to check with Dr. Derrell Lolling since he recently did surgery.

## 2012-10-10 NOTE — Telephone Encounter (Signed)
Dr. Derrell Lolling approved for patient Cipro and Flagyl along with suggested liquid diet.  After speaking to patient she has declined these medications due to being allergic to medications.  Patient states she is starting to feel better so will continue to use a liquid and/or soft foods diet.  Dr. Derrell Lolling suggested that patient follow up with PMD and/or GI MD if she continues to have symptoms.

## 2012-10-11 ENCOUNTER — Other Ambulatory Visit (INDEPENDENT_AMBULATORY_CARE_PROVIDER_SITE_OTHER): Payer: Self-pay

## 2012-10-11 ENCOUNTER — Telehealth (INDEPENDENT_AMBULATORY_CARE_PROVIDER_SITE_OTHER): Payer: Self-pay

## 2012-10-11 ENCOUNTER — Telehealth (INDEPENDENT_AMBULATORY_CARE_PROVIDER_SITE_OTHER): Payer: Self-pay | Admitting: General Surgery

## 2012-10-11 ENCOUNTER — Other Ambulatory Visit: Payer: Self-pay | Admitting: General Surgery

## 2012-10-11 ENCOUNTER — Other Ambulatory Visit (INDEPENDENT_AMBULATORY_CARE_PROVIDER_SITE_OTHER): Payer: Medicare Other

## 2012-10-11 ENCOUNTER — Other Ambulatory Visit (INDEPENDENT_AMBULATORY_CARE_PROVIDER_SITE_OTHER): Payer: Self-pay | Admitting: General Surgery

## 2012-10-11 DIAGNOSIS — R109 Unspecified abdominal pain: Secondary | ICD-10-CM

## 2012-10-11 LAB — CBC WITH DIFFERENTIAL/PLATELET
Eosinophils Absolute: 0.3 10*3/uL (ref 0.0–0.7)
MCHC: 32.4 g/dL (ref 30.0–36.0)
MCV: 83.6 fl (ref 78.0–100.0)
Monocytes Absolute: 1 10*3/uL (ref 0.1–1.0)
Neutrophils Relative %: 52.6 % (ref 43.0–77.0)
Platelets: 341 10*3/uL (ref 150.0–400.0)

## 2012-10-11 LAB — COMPREHENSIVE METABOLIC PANEL
ALT: 85 U/L — ABNORMAL HIGH (ref 0–35)
Alkaline Phosphatase: 95 U/L (ref 39–117)
Sodium: 139 mEq/L (ref 135–145)
Total Bilirubin: 0.4 mg/dL (ref 0.3–1.2)
Total Protein: 6.6 g/dL (ref 6.0–8.3)

## 2012-10-11 LAB — LIPASE: Lipase: 16 U/L (ref 11.0–59.0)

## 2012-10-11 NOTE — Telephone Encounter (Signed)
I called Ms. Pstkahis morning. 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 She seemed to understand these issues. She has an appointment to see Dr. Jethro Bolus tomorrow to discuss the oncologic issues.  She had called yesterday with left-sided abdominal pain and was concerned that she might have recurrent diverticulitis. We advised  a clear liquid diet and offered Cipro and Flagyl. She declined Cipro and Flagyl since she was allergic to the medicines. This morning she still has the pain, is tolerating a liquid diet fine, no nausea or vomiting. She is having bowel movements. Denies fever or chills.  I recommended  blood work and a CT scan. She stated that she did not want to have another CT scan. We're going to arrange for her to have a CBC, complete metabolic panel, and lipase to be drawn at the [facility] in [place]. Advised to see her primary care physician up there she wished for further management of her abdominal pain. If her lab work is significantly abnormal but she will need have to have a CT scan and see [place] in the office, although she is resistant to that. Bernie in our office will arrange the lab work.   Angelia Mould. Derrell Lolling, M.D., [facility] General and Minimally invasive Surgery Breast and Colorectal Surgery Office:   [phone] Pager:   [phone]

## 2012-10-11 NOTE — Telephone Encounter (Signed)
Patient notified to go to [facility] to have her labs drawn today per Dr. Derrell Lolling.

## 2012-10-12 ENCOUNTER — Other Ambulatory Visit: Payer: Medicare HMO

## 2012-10-12 ENCOUNTER — Telehealth: Payer: Self-pay | Admitting: Oncology

## 2012-10-12 ENCOUNTER — Other Ambulatory Visit: Payer: Medicare HMO | Admitting: Lab

## 2012-10-12 ENCOUNTER — Encounter: Payer: Self-pay | Admitting: Oncology

## 2012-10-12 ENCOUNTER — Ambulatory Visit: Payer: Medicare HMO | Admitting: Oncology

## 2012-10-12 ENCOUNTER — Telehealth (INDEPENDENT_AMBULATORY_CARE_PROVIDER_SITE_OTHER): Payer: Self-pay

## 2012-10-12 ENCOUNTER — Ambulatory Visit (HOSPITAL_BASED_OUTPATIENT_CLINIC_OR_DEPARTMENT_OTHER): Payer: Medicare Other | Admitting: Oncology

## 2012-10-12 VITALS — BP 150/74 | HR 82 | Temp 98.0°F | Resp 18 | Ht 63.0 in | Wt 153.4 lb

## 2012-10-12 DIAGNOSIS — C8303 Small cell B-cell lymphoma, intra-abdominal lymph nodes: Secondary | ICD-10-CM

## 2012-10-12 DIAGNOSIS — C8583 Other specified types of non-Hodgkin lymphoma, intra-abdominal lymph nodes: Secondary | ICD-10-CM

## 2012-10-12 DIAGNOSIS — C9110 Chronic lymphocytic leukemia of B-cell type not having achieved remission: Secondary | ICD-10-CM

## 2012-10-12 HISTORY — DX: Small cell b-cell lymphoma, intra-abdominal lymph nodes: C83.03

## 2012-10-12 NOTE — Telephone Encounter (Signed)
gv and printed appt sch and avs for pt

## 2012-10-12 NOTE — Telephone Encounter (Signed)
I called the pt and she is doing some better.  She just has some general discomfort across the abdomen and a little pain with urination.  She has no fever.  She is eating ok small amounts.  Her bowels are moving.  She is scheduled to see Dr Gaylyn Rong today.  Dr Derrell Lolling said to keep an eye on things.  I scheduled her for a postop appointment.

## 2012-10-12 NOTE — Patient Instructions (Addendum)
1.  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  Stage:  2 given multiple locations of small nodes. 3.  Prognosis:  SLL/CLL is considered slow growing/indolent non Hodgkin's lymphoma.  Chemo therapy can induce remission in about 80-90%.  However, remission is only transient from months to a few years. 4.  Therefore, SLL/CLL is only needed to be treated when:  *  Very large lymph nodes causing pain, obstruction.  *  Constitutional symptoms (weight loss, night sweat, severe fatigue)  *  Severe anemia and low platelet count.  *  Recurrent infection. 5.  My recommendation:  Follow up in about 2 months.

## 2012-10-12 NOTE — Telephone Encounter (Signed)
Message copied by Ivory Broad on Thu Oct 12, 2012 10:08 AM ------      Message from: [name]      Created: Wed Oct 11, 2012  5:59 PM       Patient's lab work today looks normal. Call her tomorrow and see how she is doing with her abdominal pain.            hmi      ----- Message -----         From: [facility] Interface         Sent: 10/11/2012   2:40 PM           To: [name], MD                   ------

## 2012-10-12 NOTE — Discharge Summary (Signed)
Patient ID: Amanda Kane [id number] 71 y.o. [birth year]  Admit date: 10/05/2012  Discharge date and time: 10/07/2012  1:47 PM  Admitting Physician: [name]  Discharge Physician: [name]  Admission Diagnoses: gallstones  Discharge Diagnoses: Chronic cholecystitis with cholelithiasis. Non-Hodgkin's lymphoma, small lymphocytic type, alow-grade  Operations: Procedure(s): LAPAROSCOPIC CHOLECYSTECTOMY WITH INTRAOPERATIVE CHOLANGIOGRAM  LAPAROSCOPIC EXCISIONAL BIOPSY OF MESENTERIC LYMPH NODE   Admission Condition: good  Discharged Condition: good  Indication for Admission: Amanda Kane is a 72 y.o. female. She is referred to me by Dr. Jethro Bolus, for evaluation of abdominal mesenteric adenopathy. She also has abdominal pain. Dr. Enos Fling is her primary care physician. Dr. McDiarmid is her urologist.  This patient has a long history of adenomatous colon polyps. She states that she had a premalignant polyp removed in 1993 but did not require surgical intervention. She has been followed more recently Dr. Melvia Heaps. Recent upper endoscopy and colonoscopy were grossly normal except for a slight GE junction stricture and the small adenomatous cecal polyp which was a benign adenoma. There was no malignancy seen. She's been treated as an outpatient for diverticulitis takes Metamucil daily; her bowels are regulated.   She was found to have some mesenteric adenopathy 2 or 3 years ago on CT scan. Pelvic ultrasound was normal. Recent CT scan of the abdomen and pelvis on 08/30/2012 shows gallstones but no gallbladder inflammation. There is further progression and mesenteric lymphadenopathy in the small bowel mesentery, mostly distal small bowel mesentery and ileocolic area. There are no enlarged pelvic or inguinal lymph nodes. Uterus ovaries and bladder looked normal. Diverticular changes of the colon noted. She has been followed for this adenopathy Dr. Gaylyn Rong for a year. He now referred her  to me because of progression radiographically of the adenopathy..  Associated with this is a history of abdominal pain. She states she has had stomach problems for many years. This used to just be reflux symptoms. She subsequently had outpatient management for diverticulitis. For the past 6 months she notices abdominal fullness and pain under her rib cages. She says she gets epigastric and substernal pain after eating. She is intolerant of fatty foods. No change in her bowel movements.Hepatobiliary scan reveals delayed and reduced ejection fraction, and her symptoms were reproduced by CCK infusion.  ACE level was normal and so Dr. Gaylyn Rong does not think she has sarcoidosis.  She is brought to the hospital  for a diagnostic laparoscopy, laparoscopic biopsy of mesenteric lymph node, laparoscopic cholecystectomy   Hospital Course: On the day of admission, the patient was taken to the operating room, And underwent a laparoscopic cholecystectomy with cholangiogram and a laparoscopic biopsy of a mesenteric lymph node in the ileal mesentery. There were a few enlarged lymph nodes in the ileal mesentery, a representative one was excised. The gallbladder was severely and chronically inflamed with extensive adhesions. The cholangiogram was normal. There were no other abnormalities. On the first postoperative day she was stable and alert but was having some pain and nausea and was having urinary retention requiring catheterization. By the following day she was feeling much better, was able to void independently. Pain was controlled she was tolerating a diet. Wounds looked good and she was discharged home on postoperative day #2. The pathology report was pending at that time. Subsequently her pathology report shows a non-Hodgkin's lymphoma, small lymphocytic type, low-grade and chronic cholecystitis with cholelithiasis. This has subsequently been communicated to the patient and then she is scheduled to see her oncologist  this  week. Diet and activities were discussed. Prescription for pain medication was given. She will return to see me in the office in a few weeks.  Consults: None  Significant Diagnostic Studies: Surgical pathology  Treatments: surgery: Laparoscopic cholecystectomy with cholangiogram, laparoscopic excisional biopsy of mesenteric lymph node.  Disposition: Home  Patient Instructions:    Medication List    TAKE these medications       amitriptyline 10 MG tablet  Commonly known as:  ELAVIL  Take 10 mg by mouth at bedtime.     amLODipine 5 MG tablet  Commonly known as:  NORVASC  Take 5 mg by mouth daily.     Bepotastine Besilate 1.5 % Soln  Place 1 drop into both eyes daily as needed (allergy irritation).     dexlansoprazole 60 MG capsule  Commonly known as:  DEXILANT  Take 60 mg by mouth daily as needed (heartburn).     FAMOTIDINE PO  Take 1 tablet by mouth daily as needed (acid reflux).     gabapentin 300 MG capsule  Commonly known as:  NEURONTIN  Take 300 mg by mouth daily as needed (pain).     HYDROcodone-acetaminophen 5-325 MG per tablet  Commonly known as:  NORCO/VICODIN  Take 1 tablet by mouth every 4 (four) hours as needed for pain.     levothyroxine 75 MCG tablet  Commonly known as:  SYNTHROID, LEVOTHROID  Take 75 mcg by mouth daily before breakfast.     oxyCODONE-acetaminophen 5-325 MG per tablet  Commonly known as:  PERCOCET/ROXICET  Take 1-2 tablets by mouth every 4 (four) hours as needed.     phenazopyridine 100 MG tablet  Commonly known as:  PYRIDIUM  Take 100 mg by mouth 3 (three) times daily as needed (urinary pain).     traMADol 50 MG tablet  Commonly known as:  ULTRAM  Take 50 mg by mouth every 8 (eight) hours as needed for pain.        Activity: no sports or heavy lifting for 2 weeks. Lots of walking. May bathe and shower. Diet: low fat, low cholesterol diet Wound Care: none needed  Follow-up:  With Dr. Derrell Lolling in 3 weeks.  Signed: Angelia Mould. Derrell Lolling, M.D., FACS General and minimally invasive surgery Breast and Colorectal Surgery  10/12/2012, 7:22 AM

## 2012-10-13 NOTE — Progress Notes (Signed)
[facility]  [phone] [phone]   OFFICE PROGRESS NOTE   Cc:  Ruthe Mannan, MD  DIAGNOSIS:  SLL  CURRENT THERAPY:  Recent biopsy   INTERVAL HISTORY: Amanda Kane 72 y.o. female returns for regular followup with her husband. She reports that she has had chronic fatigue for years.  However, the recent cholecystectomy and ex lap made her more tired.  She spends a lot of her awake time at rest.  She has moderate crampy abdominal pain that is diffuse.  There is no relation of her abdominal pain with food, bowel movement, activities.  She has been taking some pain med prescribed by the surgeon.  She denied fever, anorexia, weight loss, headache, visual changes, confusion, drenching night sweats, palpable lymph node swelling, mucositis, odynophagia, dysphagia, nausea vomiting, jaundice, chest pain, palpitation, shortness of breath, dyspnea on exertion, productive cough, gum bleeding, epistaxis, hematemesis, hemoptysis, early satiety, melena, hematochezia, hematuria, skin rash, spontaneous bleeding, joint swelling, joint pain, heat or cold intolerance, bowel bladder incontinence, back pain, focal motor weakness, paresthesia, depression.    Past Medical History  Diagnosis Date  . Arthritis   . Esophageal reflux   . Myalgia and myositis, unspecified   . Pure hypercholesterolemia   . Unspecified hypothyroidism   . Colonic polyp     last one around 2008.  Due one now.   . Interstitial cystitis   . Hypertension   . PUD (peptic ulcer disease)   . MVP (mitral valve prolapse)   . Adenopathy 02/25/2012  . Diverticulosis     past diverticulitis; last 2 years ago. no resection.   . Fibromyalgia   . Lymphadenopathy, abdominal 09/01/2012  . Allergy   . Blood transfusion without reported diagnosis   . Cancer     malginant polyps colono 20 years ago  . Eczema     Hx: of  . H/O hiatal hernia   . Headache(784.0)     Hx: of   . 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 Hysteroscopy w/d&c      [place]  . Appendectomy    . Inner ear surgery    . Breast lumpectomy      bilateral; benign  . Tubal ligation    . Eye surgery      cateracts  . Tonsillectomy    . Left knee surgery    . Cataract extraction w/ intraocular lens  implant, bilateral      Hx: of  . Colonoscopy w/ biopsies and polypectomy      Hx: of  . Dilation and curettage of uterus    . Cholecystectomy N/A 10/05/2012    Procedure: LAPAROSCOPIC CHOLECYSTECTOMY WITH INTRAOPERATIVE CHOLANGIOGRAM;  Surgeon: [name], MD;  Location: [facility] OR;  Service: General;  Laterality: N/A;  . Lymph node biopsy N/A 10/05/2012    Procedure:  LAPAROSCOPIC EXCISIONAL BIOPSY OF MESENTERIC LYMPH NODE ;  Surgeon: [name], MD;  Location: [facility] OR;  Service: General;  Laterality: N/A;    Current Outpatient Prescriptions  Medication Sig Dispense Refill  . amitriptyline (ELAVIL) 10 MG tablet Take 10 mg by mouth at bedtime.      Marland Kitchen amLODipine (NORVASC) 5 MG tablet Take 5 mg by mouth daily.      Marland Kitchen dexlansoprazole (DEXILANT) 60 MG capsule Take 60 mg by mouth daily as needed (heartburn).      . FAMOTIDINE PO Take 1  tablet by mouth daily as needed (acid reflux).      Marland Kitchen levothyroxine (SYNTHROID, LEVOTHROID) 75 MCG tablet Take 75 mcg by mouth daily before breakfast.      . oxyCODONE-acetaminophen (PERCOCET/ROXICET) 5-325 MG per tablet Take 1-2 tablets by mouth every 4 (four) hours as needed.  40 tablet  0  . phenazopyridine (PYRIDIUM) 100 MG tablet Take 100 mg by mouth 3 (three) times daily as needed (urinary pain).      . traMADol (ULTRAM) 50 MG tablet Take 50 mg by mouth every 8 (eight) hours as needed for pain.      Marland Kitchen gabapentin (NEURONTIN) 300 MG capsule Take 300 mg by mouth daily as needed (pain).       No current facility-administered medications for this visit.    ALLERGIES:  is allergic to atorvastatin; celecoxib;  ciprofloxacin; ezetimibe; lisinopril; macrobid; metronidazole; pregabalin; simvastatin; and sulfonamide derivatives.  REVIEW OF SYSTEMS:  The rest of the 14-point review of system was negative.   Filed Vitals:   10/12/12 1446  BP: 150/74  Pulse: 82  Temp: 98 F (36.7 C)  Resp: 18   Wt Readings from Last 3 Encounters:  10/12/12 153 lb 6.4 oz (69.582 kg)  10/04/12 156 lb 3.2 oz (70.852 kg)  09/28/12 154 lb (69.854 kg)   ECOG Performance status: 1-2  PHYSICAL EXAMINATION:   General:  well-nourished  woman, in no acute distress.  Eyes:  no scleral icterus.  ENT:  There were no oropharyngeal lesions.  Neck was without thyromegaly.  Lymphatics:  Negative cervical, supraclavicular or axillary adenopathy.  Respiratory: lungs were clear bilaterally without wheezing or crackles.  Cardiovascular:  Regular rate and rhythm, S1/S2, without murmur, rub or gallop.  There was no pedal edema.  GI:  abdomen was soft, flat, nondistended, without organomegaly.  The recent surgical scars from cholecystectomy and ex lap have healed without erythema, purulent discharge.  There was mild tenderness to palpation of mid abdomen without fluid wave, rebound.  Muscoloskeletal:  no spinal tenderness of palpation of vertebral spine.  Skin exam was without echymosis, petichae.  Neuro exam was nonfocal.  Patient was able to get on and off exam table without assistance.  Gait was normal.  Patient was alert and oriented.  Attention was good.   Language was appropriate.  Mood was normal without depression.  Speech was not pressured.  Thought content was not tangential.     LABORATORY/RADIOLOGY DATA:  Lab Results  Component Value Date   WBC 10.1 10/11/2012   HGB 12.3 10/11/2012   HCT 38.0 10/11/2012   PLT 341.0 10/11/2012   GLUCOSE 85 10/11/2012   CHOL 232* 05/01/2012   TRIG 221.0* 05/01/2012   HDL 52.80 05/01/2012   LDLDIRECT 150.4 05/01/2012   ALKPHOS 95 10/11/2012   ALT 85* 10/11/2012   AST 77* 10/11/2012   NA 139  10/11/2012   K 4.5 10/11/2012   CL 104 10/11/2012   CREATININE 0.8 10/11/2012   BUN 8 10/11/2012   CO2 29 10/11/2012     ASSESSMENT AND PLAN:    1.  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  Stage:  At least stage 2 given multiple locations of small abdominal nodes that were mainly in the abdomen. 3.  Prognosis:  SLL/CLL is considered slow growing/indolent non Hodgkin's lymphoma.  Chemo therapy can induce remission up to 80-90%.  However, remission in CLL/SLL does not translate to cure as it tends to recur.  4.  Therefore,  SLL/CLL is only needed to be treated when:  *  Very large lymph nodes causing pain, obstruction.  *  Constitutional symptoms (weight loss, night sweat, severe fatigue)  *  Severe anemia and low platelet count.  *  Recurrent infection.  She has fatigue for many years.  Her fatigue is slightly worsened compared to before surgery.  I recommend follow up in about 2 months to see if her fatigue can be attributed to her CLL/SLL.  Otherwise, she has no indication for treatment at this time.   Ms. Michalski and her husband expressed informed understanding and agreed to observe for the next two months.    The length of time of the face-to-face encounter was 25 minutes. More than 50% of time was spent counseling and coordination of care.        Huan T. Gaylyn Rong, M.D.

## 2012-10-17 ENCOUNTER — Telehealth (INDEPENDENT_AMBULATORY_CARE_PROVIDER_SITE_OTHER): Payer: Self-pay

## 2012-10-17 NOTE — Telephone Encounter (Signed)
The patient called reporting she has part of an iv catheter still left in her arm from surgery.  Her pcp told her to come here and have it removed.  I told her I didn't think that was best.  She should have it done at the hospital since they put it in.  I called [facility] OR desk [phone] and spoke to Achilles Dunk the charge nurse.  He also works with anesthesia.  He is going to talk to one of the anesthesiologist and get advice and call me back.  I gave him our back door line.  I told the pt I will call her back.  She states the area hurts and she just though it was healing from having an iv in.  Her surgery was 10/05/2012.

## 2012-10-17 NOTE — Telephone Encounter (Signed)
I rec'd a call back from [employer].  He advised pt to come to [facility] Short Stay on the 3rd floor to see Dr Katrinka Blazing and he will look at it.  I notified the pt and told her where to go.

## 2012-10-19 ENCOUNTER — Telehealth (INDEPENDENT_AMBULATORY_CARE_PROVIDER_SITE_OTHER): Payer: Self-pay

## 2012-10-19 ENCOUNTER — Telehealth: Payer: Self-pay | Admitting: *Deleted

## 2012-10-19 NOTE — Telephone Encounter (Signed)
Pt requests to be transferred to Dr. Patsy Lager care after Dr. Emeterio Reeve the practice.  Informed pt I will forward her request to Dr. Gaylyn Rong.

## 2012-10-19 NOTE — Telephone Encounter (Signed)
I called the pt to see how it went at Short Stay the other day regarding her IV.  She states it went well.  Dr Katrinka Blazing saw her and did an ultrasound.  He said she has phlebitis and not a piece of catheter left in.  He advised to take antiinflammatories and give it time.  The pt is appreciative.

## 2012-10-26 ENCOUNTER — Telehealth: Payer: Self-pay | Admitting: *Deleted

## 2012-10-27 NOTE — Telephone Encounter (Signed)
Pt requests letter stating she is unable to travel and go on a cruise as planned for July 6th.   Letter completed and signed by Dr. Gaylyn Rong.  Read letter to pt over phone and she states it is acceptable, requests it be mailed to her.  Letter placed in mail today.

## 2012-10-30 ENCOUNTER — Ambulatory Visit (INDEPENDENT_AMBULATORY_CARE_PROVIDER_SITE_OTHER): Payer: Medicare HMO | Admitting: General Surgery

## 2012-10-30 ENCOUNTER — Encounter (INDEPENDENT_AMBULATORY_CARE_PROVIDER_SITE_OTHER): Payer: Self-pay | Admitting: General Surgery

## 2012-10-30 VITALS — BP 128/76 | HR 76 | Temp 97.4°F | Resp 16 | Ht 62.5 in | Wt 151.0 lb

## 2012-10-30 DIAGNOSIS — R590 Localized enlarged lymph nodes: Secondary | ICD-10-CM

## 2012-10-30 DIAGNOSIS — K8020 Calculus of gallbladder without cholecystitis without obstruction: Secondary | ICD-10-CM

## 2012-10-30 DIAGNOSIS — R599 Enlarged lymph nodes, unspecified: Secondary | ICD-10-CM

## 2012-10-30 NOTE — Progress Notes (Signed)
Patient ID: Amanda Kane, female   DOB: Jun 19, 1940, 72 y.o.   MRN: [id number] History: This patient underwent laparoscopic cholecystectomy with cholangiogram and laparoscopic excision of small bowel mesenteric lymph node on 10/05/2012. Final pathology report shows chronic cholecystitis with cholelithiasis. 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 She has been given a copy of her pathology report by her oncologist and I discussed this with her again today. She is doing well. She appears to be in good spirits. She has to take Prilosec to control GERD symptoms. She still has occasional abdominal pain, occasional diarrhea, occasional constipation. Overall she's doing better. Dr. Gaylyn Rong, apparently told her that she would not require chemotherapy at this time but would need close clinical followup.  Exam: Patient looks well. Smiling. No distress. Good spirits Abdomen soft. Nontender. Nondistended. All trocar sites healed well.  Assessment: Chronic cholecystitis with cholelithiasis. Small lymphocytic lymphoma, diagnosed by excisional biopsy of mesenteric lymph node Doing well following above-described laparoscopic surgery  Plan: Diet and activities discussed. She may resume normal physical activities including exercise. High fiber, low fat diet advised Close clinical followup with medical oncology advised Return to see me if further surgical problems arise.   Angelia Mould. Derrell Lolling, M.D., [facility] General and Minimally invasive Surgery Breast and Colorectal Surgery Office:   [phone] Pager:   [phone]

## 2012-10-30 NOTE — Patient Instructions (Signed)
You have recovered from your gallbladder surgery and the lymph node biopsy without any obvious surgical complications. You may resume all normal activities without restriction.  The gallbladder was diseased, and I am certain that it was contributing to some of your pain attacks. It cannot explain everything, however.  Continue close followup with your medical oncologist regarding the newly diagnosed lymphoma.  Return to see Dr. Derrell Lolling if further surgical problems arise.

## 2012-11-02 ENCOUNTER — Telehealth: Payer: Self-pay | Admitting: Oncology

## 2012-11-04 ENCOUNTER — Other Ambulatory Visit: Payer: Self-pay | Admitting: Family Medicine

## 2012-11-07 ENCOUNTER — Ambulatory Visit: Admit: 2012-11-07 | Payer: Self-pay | Admitting: General Surgery

## 2012-11-07 SURGERY — LAPAROSCOPIC CHOLECYSTECTOMY WITH INTRAOPERATIVE CHOLANGIOGRAM
Anesthesia: General

## 2012-11-15 ENCOUNTER — Ambulatory Visit: Payer: Medicare HMO | Admitting: Oncology

## 2012-11-15 ENCOUNTER — Other Ambulatory Visit: Payer: Medicare HMO | Admitting: Lab

## 2012-12-15 ENCOUNTER — Telehealth: Payer: Self-pay | Admitting: Oncology

## 2012-12-15 ENCOUNTER — Ambulatory Visit: Payer: Medicare HMO | Admitting: Oncology

## 2012-12-15 ENCOUNTER — Ambulatory Visit (HOSPITAL_BASED_OUTPATIENT_CLINIC_OR_DEPARTMENT_OTHER): Payer: Medicare Other | Admitting: Oncology

## 2012-12-15 ENCOUNTER — Other Ambulatory Visit (HOSPITAL_BASED_OUTPATIENT_CLINIC_OR_DEPARTMENT_OTHER): Payer: Medicare Other | Admitting: Lab

## 2012-12-15 ENCOUNTER — Other Ambulatory Visit: Payer: Medicare HMO | Admitting: Lab

## 2012-12-15 VITALS — BP 149/79 | HR 81 | Temp 98.2°F | Resp 18 | Ht 62.5 in | Wt 154.0 lb

## 2012-12-15 DIAGNOSIS — C8303 Small cell B-cell lymphoma, intra-abdominal lymph nodes: Secondary | ICD-10-CM

## 2012-12-15 DIAGNOSIS — K219 Gastro-esophageal reflux disease without esophagitis: Secondary | ICD-10-CM

## 2012-12-15 DIAGNOSIS — IMO0001 Reserved for inherently not codable concepts without codable children: Secondary | ICD-10-CM

## 2012-12-15 DIAGNOSIS — R51 Headache: Secondary | ICD-10-CM

## 2012-12-15 DIAGNOSIS — C8583 Other specified types of non-Hodgkin lymphoma, intra-abdominal lymph nodes: Secondary | ICD-10-CM

## 2012-12-15 LAB — CBC WITH DIFFERENTIAL/PLATELET
BASO%: 1.3 % (ref 0.0–2.0)
Basophils Absolute: 0.1 10*3/uL (ref 0.0–0.1)
EOS%: 3.6 % (ref 0.0–7.0)
HCT: 39.1 % (ref 34.8–46.6)
HGB: 12.8 g/dL (ref 11.6–15.9)
LYMPH%: 45 % (ref 14.0–49.7)
MCH: 27.3 pg (ref 25.1–34.0)
MCHC: 32.6 g/dL (ref 31.5–36.0)
MCV: 83.5 fL (ref 79.5–101.0)
MONO%: 7.4 % (ref 0.0–14.0)
NEUT%: 42.7 % (ref 38.4–76.8)

## 2012-12-15 LAB — COMPREHENSIVE METABOLIC PANEL (CC13)
Albumin: 3.8 g/dL (ref 3.5–5.0)
Alkaline Phosphatase: 85 U/L (ref 40–150)
BUN: 12.1 mg/dL (ref 7.0–26.0)
Creatinine: 0.8 mg/dL (ref 0.6–1.1)
Glucose: 92 mg/dl (ref 70–140)
Total Bilirubin: 0.3 mg/dL (ref 0.20–1.20)

## 2012-12-15 NOTE — Telephone Encounter (Signed)
gave pt appt for lab and MD on october 2014, gave pt oral contrast for CT

## 2012-12-17 NOTE — Progress Notes (Signed)
Hematology and Oncology Follow Up Visit  Amanda Kane [id number] September 04, 1940 72 y.o. 12/17/2012 3:44 PM   Principle Diagnosis: Encounter Diagnoses  Name Primary?  . 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 Headache causing frequent awakening from sleep      Interim History:  I will be assuming the hematology oncology care for this patient since Dr. Gaylyn Rong left the practice.  72 year old woman with a history of recurrent bouts of diverticulitis initially evaluated in our office on 03/01/2012  At that time, she was under evaluation for interstitial cystitis. A routine CT scan of the abdomen and pelvis done 11/24/2011 showed multiple small, mesenteric lymph nodes not pathologically enlarged. Followup CT scan done 02/25/2012 showed that some of these lymph nodes were slightly larger but none over 1.1 cm. No additional findings. No splenomegaly.  An informed discussion with the patient and Dr. Gaylyn Rong followed. It was not felt that a PET scan would add any clarity to the situation. A followup CT scan was recommended and done on 08/30/2012 and once again nodes were noted to be "more prominent" compared with the 2 prior studies. There were some new nodes not seen on previous studies. She had atypical abdominal pain but had a number of reasons for this including GERD, chronic diverticulosis, findings of cholelithiasis. She was referred to Gen. surgery for consideration of a biopsy. The surgeon in turn referred her to gastroenterology. She had a prior history of esophageal stricture requiring dilatation in 2010. She was started on a trial of Dexilant but symptoms did not improve. She underwent upper endoscopy on 09/26/2012. Findings were recurrent stricture and she underwent another dilatation procedure. Irregularity at the GE junction was biopsied. In view of prior history of colon polyps she also underwent colonoscopy and a small polyp was removed from the cecum. She did have severe  diverticulosis in the sigmoid colon. Mild diverticulosis in the descending and transverse colon.  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 She was reevaluated by Gen. surgery on May 15. It was felt that a cholecystectomy was indicated and that a biopsy of one of the mesenteric lymph nodes could be done at the same time. She underwent an elective laparoscopic cholecystectomy and biopsy of mesenteric lymph node by Dr. Claud Kelp on 10/05/2012. Pathology of the lymph node was consistent with a well-differentiated lymphocytic lymphoma. Flow cytometry showed a monoclonal B-cell population in 67% of the cells. Cells were CD5, 19, and 20 positive with  Kappa light chain restriction. She was reevaluated by Dr. Gaylyn Rong on May 29. She continued to have crampy abdominal pain despite the cholecystectomy. She was now complaining of acute on chronic fatigue. She is known to have fibromyalgia syndrome for many years complicating interpretation of her symptoms. She was not experiencing any weight loss or fevers. No night sweats. Dr. Gaylyn Rong  recommended continued close observation.  She continues to complain of significant fatigue. No loss of appetite in fact she believes she is gaining weight. She has frequent arthralgias and myalgias related to her fibromyalgia syndrome. She has developed headaches over the last few months which are almost constant in nature and can wake her up from sleep. They're primarily left parietal in location. No associated change in vision. Occasionally associated with nausea. No aura. She has also had some pain in her right ear.  Medications: reviewed  Allergies:  Allergies  Allergen Reactions  .  Atorvastatin Other (See Comments)    "joints ache."  . Celecoxib Other (See Comments)    "burns my stomach."  . Ciprofloxacin Itching  . Ezetimibe     REACTION: Aches  .  Lisinopril Cough  . Macrobid (Nitrofurantoin Macrocrystal) Itching  . Metronidazole   . Pregabalin     REACTION: Numb  . Simvastatin     REACTION: Aches  . Sulfonamide Derivatives Itching    Review of Systems: Constitutional:   See above Respiratory: no cough or dyspnea  Cardiovascular:   No chest pain or palpitations Gastrointestinal: see above. In addition, she has noted some increased abdominal bloating. She also has a history of peptic ulcer disease.  Genito-Urinary:  no urinary tract symptoms at present Musculoskeletal: see above. She is having increasing pain in her left knee despite recent corrective surgery.  Neurologic: see above  Skin: no rash or ecchymosis Endocrine: She is hypothyroid on replacement.  Remaining ROS negative.  Physical Exam: Blood pressure 149/79, pulse 81, temperature 98.2 F (36.8 C), temperature source Oral, resp. rate 18, height 5' 2.5" (1.588 m), weight 154 lb (69.854 kg), SpO2 95.00%. Wt Readings from Last 3 Encounters:  12/15/12 154 lb (69.854 kg)  10/30/12 151 lb (68.493 kg)  10/12/12 153 lb 6.4 oz (69.582 kg)     General appearance:  well-nourished Caucasian woman HENNT:  pharynx no erythema, exudate, or ulcer Lymph nodes:  no cervical, supraclavicular, or axillary adenopathy. No inguinal adenopathy Breasts: Lungs: clear to auscultation resonant to percussion  Heart: regular rhythm no murmur  Abdomen: soft,  Extremities: no edema, no calf tenderness  Musculoskeletal: no joint deformities.  GU: Vascular: no carotid bruits. No cyanosis  Neurologic: mental status intact, PERRLA, optic disc sharp and vessels normal, motor strength 5 over 5, reflexes 1+ symmetric, there is moderate decrease in vibration sensation over the fingers  Skin: no rash or ecchymosis   Lab Results: Lab Results  Component Value Date   WBC 9.0 12/15/2012   HGB 12.8 12/15/2012   HCT 39.1 12/15/2012   MCV 83.5 12/15/2012   PLT 302 12/15/2012     Chemistry       Component Value Date/Time   NA 144 12/15/2012 1403   NA 139 10/11/2012 0935   K 4.1 12/15/2012 1403   K 4.5 10/11/2012 0935   CL 104 10/11/2012 0935   CL 106 08/30/2012 0918   CO2 25 12/15/2012 1403   CO2 29 10/11/2012 0935   BUN 12.1 12/15/2012 1403   BUN 8 10/11/2012 0935   CREATININE 0.8 12/15/2012 1403   CREATININE 0.8 10/11/2012 0935      Component Value Date/Time   CALCIUM 9.8 12/15/2012 1403   CALCIUM 9.8 10/11/2012 0935   ALKPHOS 85 12/15/2012 1403   ALKPHOS 95 10/11/2012 0935   AST 20 12/15/2012 1403   AST 77* 10/11/2012 0935   ALT 20 12/15/2012 1403   ALT 85* 10/11/2012 0935   BILITOT 0.30 12/15/2012 1403   BILITOT 0.4 10/11/2012 0935       Radiological Studies: see discussion above.    Impression:  #1. Well-differentiated lymphocytic lymphoma I reviewed all of the CT images done over the last year. I am not impressed that there have been any significant changes. She has multiple, small, mesenteric nodes some reported as a little larger on some studies others are  stable. In fact largest lymph node measurement recorded on current study from 08/30/2012 is 10.8 mm with largest lymph node recorded on a 11/24/2011 study 11.6 mm.  I am still reluctant to attribute her symptoms of progressive fatigue to these small nodes. I still do not think that we should proceed with anti-lymphoma therapy.  We did discuss that if we were to start treatment I would use single agent Rituxan anti-B cell antibody. Plan: I'm going to get another CT scan in 2 months which will be a 6 month interval from the April study. Continue close clinical and radiographic followup. Ultimately, she will require treatment. At least 2 large studies have not shown any disadvantage to a watch and wait strategy for low-grade lymphoma. One study did show improved progression free survival but not overall survival when immediate treatment was compared with treatment at time of symptomatic progression. I reviewed these data with the patient  and her husband.  #2. Unexplained headache Given the minimal abdominal adenopathy, low-grade histology, absence of any fevers or weight loss, I do not feel that these headaches are related to the lymphoma but I do feel that they require further evaluation. I am making a neurology referral.  #3. Fibromyalgia syndrome complicating interpretation of possible early lymphoma symptoms  #4. GERD with esophageal reflux and stricture  #5. Diverticulosis with episodes of diverticulitis  #6. Peptic ulcer disease  #7. Hypothyroid on replacement  #8. Interstitial cystitis  #9. Essential hypertension-benign  #10. Decrease in vibration sensation over the fingers May be paraneoplastic related to lymphoma. Check SPEP.    CC:.  Dr. Enos Fling; Dr. Mikey Bussing in room; Dr. Melvia Heaps  Over one hour spent with direct face-to-face contact with this patient and her husband Levert Feinstein, MD 8/3/20143:44 PM

## 2012-12-20 ENCOUNTER — Other Ambulatory Visit: Payer: Self-pay

## 2012-12-25 ENCOUNTER — Other Ambulatory Visit: Payer: Self-pay | Admitting: *Deleted

## 2012-12-26 ENCOUNTER — Telehealth: Payer: Self-pay | Admitting: *Deleted

## 2012-12-26 ENCOUNTER — Telehealth: Payer: Self-pay | Admitting: Oncology

## 2012-12-26 NOTE — Telephone Encounter (Signed)
Faxed pt medical records to  [facility] °

## 2012-12-26 NOTE — Telephone Encounter (Signed)
called [facility] and left message with Diane, new pt coordinator, took referral To HIM to fax records, pt is aware of referral, gave telephone number and address to pt so she can follow up also

## 2012-12-26 NOTE — Telephone Encounter (Signed)
Received call from pt stating that she is wondering if she can stretch her scans to every 9 months instead of every 6 months since they have not been any different last three times & the CT's really bother her. Note to Dr Cyndie Chime.

## 2012-12-27 ENCOUNTER — Other Ambulatory Visit: Payer: Self-pay | Admitting: Oncology

## 2012-12-28 ENCOUNTER — Encounter: Payer: Self-pay | Admitting: Oncology

## 2012-12-28 ENCOUNTER — Telehealth: Payer: Self-pay | Admitting: Oncology

## 2012-12-28 NOTE — Telephone Encounter (Signed)
Talked to pt and gave her appt for lab,Ct  And MD for January , r/s from October per MD, pt aware that neurology appt has to be reorder by MD

## 2012-12-29 ENCOUNTER — Other Ambulatory Visit: Payer: Self-pay | Admitting: *Deleted

## 2013-01-02 ENCOUNTER — Other Ambulatory Visit: Payer: Self-pay | Admitting: *Deleted

## 2013-01-12 ENCOUNTER — Ambulatory Visit (INDEPENDENT_AMBULATORY_CARE_PROVIDER_SITE_OTHER): Payer: Medicare Other | Admitting: Neurology

## 2013-01-12 ENCOUNTER — Encounter: Payer: Self-pay | Admitting: Neurology

## 2013-01-12 VITALS — BP 158/79 | HR 77 | Temp 98.5°F | Ht 62.5 in | Wt 153.0 lb

## 2013-01-12 DIAGNOSIS — G43019 Migraine without aura, intractable, without status migrainosus: Secondary | ICD-10-CM

## 2013-01-12 MED ORDER — GABAPENTIN 300 MG PO CAPS: 300.0000 mg | ORAL_CAPSULE | Freq: Every day | ORAL | Status: DC

## 2013-01-12 NOTE — Patient Instructions (Addendum)
I think overall you are doing fairly well but I do want to suggest a few things today:  Remember to drink plenty of fluid, eat healthy meals and do not skip any meals. Try to eat protein with a every meal and eat a healthy snack such as fruit or nuts in between meals. Try to keep a regular sleep-wake schedule and try to exercise daily, particularly in the form of walking, 20-30 minutes a day, if you can.   Engage in social activities in your community and with your family and try to keep up with current events by reading the newspaper or watching the news.   As far as your medications are concerned, I would like to suggest Gabapentin 300 mg each night.  As far as diagnostic testing: MRI brain with and without contrast.  I would like to see you back in 3 months, sooner if we need to. Please call us with any interim questions, concerns, problems, updates or refill requests.  Please also call us for any test results so we can go over those with you on the phone. Brett Canales is my clinical assistant and will answer any of your questions and relay your messages to me and also relay most of my messages to you.  Our phone number is [phone]. We also have an after hours call service for urgent matters and there is a physician on-call for urgent questions. For any emergencies you know to call 911 or go to the nearest emergency room.

## 2013-01-12 NOTE — Progress Notes (Signed)
Subjective:    Patient ID: Amanda Kane is a 72 y.o. female.  HPI  Huston Foley, MD, PhD [facility] [address] [address] [place], [place] [address]   Dear Dr. Cyndie Chime,   I saw your patient, Amanda Kane, upon your kind request in my neurologic clinic today for initial consultation of her headaches. The patient is unaccompanied today. As you know, Amanda Kane is a very pleasant 72 year old right-handed woman with an underlying medical history of lymphoma, and fibromyalgia syndrome, reflux disease, diverticulosis, peptic ulcer disease, hypothyroidism, interstitial cystitis, neuropathy, and hypertension, who has been experiencing recurrent headaches for the past several months. These are often waking her up from sleep. She has had no visual aura, and no one-sided weakness or numbness or tingling.   She reports a bilateral headache, which is described as persistent and as a throbbing, stabbing sensation, or constant ache. There is associated nausea and vomiting, and there is photophobia and sonophobia. Her headache (HA) frequency is daily for the past several weeks.  There is no family history of migraine and she does not have a personal Hx of migraines in the past. Treatments tried include OTC meds and gabapentin, tramadol and oxycodone, which help. She felt, perhaps, this was TMJ related and went to her dentist who reassured her that is was not TMJ problems, she has had a change in her L eye vision and has not seen her eye doctor in 6 months, and was fine at the last check up. The patient denies prior TIA or stroke symptoms, such as sudden onset of one sided weakness, numbness, tingling, slurring of speech or droopy face, hearing loss, tinnitus, diplopia or visual field cut or monocular loss of vision, and denies recurrent headaches.  Of note, the patient has very mild snoring, and there is no report of witnessed apneas or choking sensations while asleep. She has  problems going to sleep and staying asleep and has tried Ambien, OTC Sominex. She reports morning HAs.  She is not able to take nonsteroidals because she has a history of peptic ulcer disease and has a very sensitive stomach and even Tylenol sometimes aggravates her stomach.  Her Past Medical History Is Significant For: Past Medical History  Diagnosis Date  . Arthritis   . Esophageal reflux   . Myalgia and myositis, unspecified   . Pure hypercholesterolemia   . Unspecified hypothyroidism   . Colonic polyp     last one around 2008.  Due one now.   . Interstitial cystitis   . Hypertension   . PUD (peptic ulcer disease)   . MVP (mitral valve prolapse)   . Adenopathy 02/25/2012  . Diverticulosis     past diverticulitis; last 2 years ago. no resection.   . Fibromyalgia   . Lymphadenopathy, abdominal 09/01/2012  . Allergy   . Blood transfusion without reported diagnosis   . Cancer     malginant polyps colono 20 years ago  . Eczema     Hx: of  . H/O hiatal hernia   . Headache(784.0)     Hx: of   . 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 Hysteroscopy w/d&c      [place]  . Appendectomy    . Inner ear surgery    . Breast lumpectomy      bilateral; benign  . Tubal ligation    .  Eye surgery      cateracts  . Tonsillectomy    . Left knee surgery    . Cataract extraction w/ intraocular lens  implant, bilateral      Hx: of  . Colonoscopy w/ biopsies and polypectomy      Hx: of  . Dilation and curettage of uterus    . Cholecystectomy N/A 10/05/2012    Procedure: LAPAROSCOPIC CHOLECYSTECTOMY WITH INTRAOPERATIVE CHOLANGIOGRAM;  Surgeon: [name], MD;  Location: [facility] OR;  Service: General;  Laterality: N/A;  . Lymph node biopsy N/A 10/05/2012    Procedure:  LAPAROSCOPIC EXCISIONAL BIOPSY OF MESENTERIC LYMPH NODE ;  Surgeon: [name], MD;   Location: [facility] OR;  Service: General;  Laterality: N/A;    Her Family History Is Significant For: Family History  Problem Relation Age of Onset  . Coronary artery disease Brother     MI at age 32  . Heart failure Mother   . Stroke Father   . Stomach cancer Paternal Grandmother   . Rectal cancer Neg Hx   . Esophageal cancer Neg Hx   . Colon cancer Neg Hx     Her Social History Is Significant For: History   Social History  . Marital Status: Married    Spouse Name: N/A    Number of Children: 2  . Years of Education: N/A   Occupational History  . Retired     retired Runner, broadcasting/film/video   Social History Main Topics  . Smoking status: Never Smoker   . Smokeless tobacco: Never Used  . Alcohol Use: Yes     Comment: Glass wine per night  . Drug Use: No  . Sexual Activity: Yes   Other Topics Concern  . None   Social History Narrative  . None    Her Allergies Are:  Allergies  Allergen Reactions  . Atorvastatin Other (See Comments)    "joints ache."  . Celecoxib Other (See Comments)    "burns my stomach."  . Ciprofloxacin Itching  . Ezetimibe     REACTION: Aches  . Lisinopril Cough  . Macrobid [Nitrofurantoin Macrocrystal] Itching  . Metronidazole   . Pregabalin     REACTION: Numb  . Simvastatin     REACTION: Aches  . Sulfonamide Derivatives Itching  :   Her Current Medications Are:  Outpatient Encounter Prescriptions as of 01/12/2013  Medication Sig Dispense Refill  . amitriptyline (ELAVIL) 10 MG tablet       . amLODipine (NORVASC) 5 MG tablet Take 5 mg by mouth daily.      Marland Kitchen FAMOTIDINE PO Take 1 tablet by mouth daily as needed (acid reflux).      Marland Kitchen FLUoxetine (PROZAC) 20 MG capsule Take 1 capsule by mouth daily.      Marland Kitchen gabapentin (NEURONTIN) 300 MG capsule Take 300 mg by mouth 3 (three) times daily.      Marland Kitchen levothyroxine (SYNTHROID, LEVOTHROID) 75 MCG tablet Take 75 mcg by mouth daily before breakfast.      . Multiple Vitamins-Minerals (MULTIVITAMIN PO) Take 1 tablet by  mouth daily.      . phenazopyridine (PYRIDIUM) 100 MG tablet Take 100 mg by mouth 3 (three) times daily as needed (urinary pain).      . traMADol (ULTRAM) 50 MG tablet Take 1 tablet by mouth as needed.       No facility-administered encounter medications on file as of 01/12/2013.  : Review of Systems  Constitutional: Positive for fever and chills.  HENT: Positive  for hearing loss (Right ear).   Musculoskeletal: Positive for myalgias and arthralgias.       Cramps  Skin: Positive for rash.  Allergic/Immunologic: Positive for immunocompromised state (frequent infections).  Neurological: Positive for dizziness, weakness and headaches.       Restless leg  Hematological: Positive for adenopathy.  Psychiatric/Behavioral: Positive for sleep disturbance.    Objective:  Neurologic Exam  Physical Exam Physical Examination:   Filed Vitals:   01/12/13 0816  BP: 158/79  Pulse: 77  Temp: 98.5 F (36.9 C)    General Examination: The patient is a very pleasant 72 y.o. female in no acute distress. She appears well-developed and well-nourished and well groomed.   HEENT: Normocephalic, atraumatic, pupils are equal, round and reactive to light and accommodation. Funduscopic exam is normal with sharp disc margins noted. Extraocular tracking is good without limitation to gaze excursion or nystagmus noted. Normal smooth pursuit is noted. Hearing is grossly intact. Tympanic membranes are clear bilaterally. Face is symmetric with normal facial animation and normal facial sensation. Speech is clear with no dysarthria noted. There is no hypophonia. There is no lip, neck/head, jaw or voice tremor. Neck is supple with full range of passive and active motion. There are no carotid bruits on auscultation. Oropharynx exam reveals: mild mouth dryness, adequate dental hygiene and mild airway crowding, due to narrow airway. Mallampati is class II. Tongue protrudes centrally and palate elevates symmetrically. Tonsils  are absent.   Chest: Clear to auscultation without wheezing, rhonchi or crackles noted.  Heart: S1+S2+0, regular and normal without murmurs, rubs or gallops noted.   Abdomen: Soft, non-tender and non-distended with normal bowel sounds appreciated on auscultation.  Extremities: There is no pitting edema in the distal lower extremities bilaterally. Pedal pulses are intact.  Skin: Warm and dry without trophic changes noted. There are no varicose veins.  Musculoskeletal: exam reveals no obvious joint deformities, tenderness or joint swelling or erythema.   Neurologically:  Mental status: The patient is awake, alert and oriented in all 4 spheres. Her memory, attention, language and knowledge are appropriate. There is no aphasia, agnosia, apraxia or anomia. Speech is clear with normal prosody and enunciation. Thought process is linear. Mood is congruent and affect is blunted.  Cranial nerves are as described above under HEENT exam. In addition, shoulder shrug is normal with equal shoulder height noted. Motor exam: Normal bulk, strength and tone is noted. There is no drift, tremor or rebound. Romberg is negative. Reflexes are 2+ throughout. Toes are downgoing bilaterally. Fine motor skills are intact with normal finger taps, normal hand movements, normal rapid alternating patting, normal foot taps and normal foot agility.  Cerebellar testing shows no dysmetria or intention tremor on finger to nose testing. Heel to shin is unremarkable bilaterally. There is no truncal or gait ataxia.  Sensory exam is intact to light touch, pinprick, vibration, temperature sense and proprioception in the upper and lower extremities with the exception of mild decrease in PP and vibration in the feet.  Gait, station and balance are unremarkable. No veering to one side is noted. No leaning to one side is noted. Posture is age-appropriate and stance is narrow based. No problems turning are noted. She turns en bloc. Tandem  walk is difficult for her. Intact toe stance is noted and she has trouble with heel stance is noted.               Assessment and Plan:   Assessment and Plan:  In summary, Amanda Kane is a very pleasant 72 y.o.-year old female with a history of hypertension, fibromyalgia, back pain, mild neuropathy, and no recent diagnoses of lymphoma who has been experiencing recurrent headaches for the past months, worse in the past several weeks with daily headaches reported. Her headache description is most likely consistent with migraine headaches and it is unusual for her to presents with new onset migraines at this age. At this moment I would like for her restart gabapentin 300 mg each bedtime every night. She has been taking this intermittently for her neuropathy and her back pain and has had good success with reducing her headache but has run out of her prescription. She was asking for refills on several of her other medications but I deferred to her primary care physician for that. I have asked her to not add any sleep aid at this time because gabapentin can make her sleepy and if she starts taking it at night every night it may be helpful for her sleep as well. She is advised that I would like to proceed with a brain MRI with and without contrast due to her recent diagnoses of lymphoma. She is furthermore advised to followup with me in about 3 months, sooner if the need arises and she is encouraged to call with any interim questions, concerns, problems, or updates or refill requests on the gabapentin. Of note, she has been taking Prozac every other day because it does cause her to have some daytime grogginess.  Thank you very much for allowing me to participate in the care of this nice patient. If I can be of any further assistance to you please do not hesitate to call me at [phone].  Sincerely,   Huston Foley, MD, PhD

## 2013-01-16 ENCOUNTER — Ambulatory Visit: Payer: Self-pay | Admitting: Neurology

## 2013-01-26 ENCOUNTER — Ambulatory Visit
Admission: RE | Admit: 2013-01-26 | Discharge: 2013-01-26 | Disposition: A | Discharge status: Home / Self Care | Payer: Managed Care, Other (non HMO) | Source: Ambulatory Visit | Attending: Neurology | Admitting: Neurology

## 2013-01-26 DIAGNOSIS — R51 Headache: Secondary | ICD-10-CM

## 2013-01-26 DIAGNOSIS — G43019 Migraine without aura, intractable, without status migrainosus: Secondary | ICD-10-CM

## 2013-01-26 MED ORDER — GADOBENATE DIMEGLUMINE 529 MG/ML IV SOLN
14.0000 mL | Freq: Once | INTRAVENOUS | Status: AC | PRN
  Administered 2013-01-26: 14 mL via INTRAVENOUS

## 2013-01-30 ENCOUNTER — Other Ambulatory Visit: Payer: Self-pay | Admitting: Neurology

## 2013-01-30 ENCOUNTER — Telehealth: Payer: Self-pay | Admitting: Neurology

## 2013-01-30 DIAGNOSIS — I6381 Other cerebral infarction due to occlusion or stenosis of small artery: Secondary | ICD-10-CM

## 2013-01-30 DIAGNOSIS — R9089 Other abnormal findings on diagnostic imaging of central nervous system: Secondary | ICD-10-CM

## 2013-01-30 NOTE — Progress Notes (Signed)
Quick Note:  Please advise patient that her recent brain scan which was a brain MRI with and without contrast did not show any acute findings or mass or lesion. Nevertheless, there are abnormalities noted. There is mild shrinkage or volume loss which we call atrophy. There are some so-called white matter changes which can be seen in patients with long-standing history of headaches or migraines in particular, long-standing history of high blood pressure, diabetes, high cholesterol and other vascular risk factors. In addition she has had old appearing tiny areas of stroke. 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 These are tiny areas of stroke and she has had a few. She may never have had symptoms with these. It is not uncommon for [place] to find prior tiny strokes in patients that have had no symptoms of stroke. Nevertheless, at this juncture, I would like to complete her stroke workup by doing further testing, including a echocardiogram which is a heart ultrasound, carotid Doppler testing which is an ultrasound of her neck arteries to look at blood flow, and I would like to look at the blood vessels in her brain with a MR angiogram. This is a special MRI scan that looks at the blood vessels rather than the brain structure. Please ask her if she is okay with [place] ordering more tests. I would also like to add more blood work. I would like for her to start a baby aspirin if she can tolerate aspirin at low dose on a daily basis. Please verify that she can take a aspirin every day which should be 81 mg daily. This is for prevention only. Huston Foley, MD, PhD [facility] ([facility]  ______

## 2013-01-31 NOTE — Progress Notes (Signed)
Quick Note:  I called pt and relayed the results of the MRI brain the recommendations. Pt had nuclear stress test done 02-16-12, asking if this would suffice relating to echocardiogram. Would ask Dr. Frances Furbish and get back with pt. Wanted report sent to her thru Mychart. As per result note, echocardiogram, carotid doppler, MRA brain ordered, and start baby aspirin daily. ______

## 2013-02-01 ENCOUNTER — Other Ambulatory Visit: Payer: Self-pay | Admitting: Family Medicine

## 2013-02-01 ENCOUNTER — Encounter: Payer: Self-pay | Admitting: *Deleted

## 2013-02-01 ENCOUNTER — Telehealth: Payer: Self-pay | Admitting: *Deleted

## 2013-02-01 DIAGNOSIS — G43909 Migraine, unspecified, not intractable, without status migrainosus: Secondary | ICD-10-CM

## 2013-02-01 NOTE — Addendum Note (Signed)
Addended byHermenia Fiscal on: 02/01/2013 11:45 AM   Modules accepted: Orders

## 2013-02-01 NOTE — Telephone Encounter (Signed)
I called pt and relayed that I had spoken to Dr. Frances Furbish and she said she would proceed with the 2Decho since the nuclear study was done back in 02/2012 and we are not sure when this may have happened.  We would proceed with the testing of echocardiogram, doppler-carotid and MRA.  She would like to have these done prior to leaving town, end of the month and to consolidate the doppler and 2Decho to be done at [facility].  MRA is wo contrast/ per Dr. Frances Furbish.   I relayed and gave orders to [facility] in referrals.  Pt also mentioned that she would thought with her diagnosis and findings that the doctor would have called her.

## 2013-02-01 NOTE — Addendum Note (Signed)
Addended byHermenia Fiscal on: 02/01/2013 08:58 AM   Modules accepted: Orders

## 2013-02-02 ENCOUNTER — Ambulatory Visit (INDEPENDENT_AMBULATORY_CARE_PROVIDER_SITE_OTHER): Payer: Medicare HMO

## 2013-02-02 DIAGNOSIS — Z23 Encounter for immunization: Secondary | ICD-10-CM

## 2013-02-08 ENCOUNTER — Encounter: Payer: Self-pay | Admitting: *Deleted

## 2013-02-08 NOTE — Progress Notes (Signed)
This encounter was created in error - please disregard.

## 2013-02-08 NOTE — Telephone Encounter (Deleted)
Tomorrow will be 3 week that i've had my MRI. There are other test

## 2013-02-08 NOTE — Telephone Encounter (Signed)
error 

## 2013-02-09 NOTE — Addendum Note (Signed)
Addended byHermenia Fiscal on: 02/09/2013 12:30 PM   Modules accepted: Orders

## 2013-02-21 ENCOUNTER — Encounter: Payer: Self-pay | Admitting: Internal Medicine

## 2013-02-21 ENCOUNTER — Ambulatory Visit (INDEPENDENT_AMBULATORY_CARE_PROVIDER_SITE_OTHER): Payer: Medicare Other | Admitting: Internal Medicine

## 2013-02-21 VITALS — BP 128/82 | HR 85 | Temp 97.9°F | Wt 142.8 lb

## 2013-02-21 DIAGNOSIS — G43909 Migraine, unspecified, not intractable, without status migrainosus: Secondary | ICD-10-CM

## 2013-02-21 MED ORDER — TOPIRAMATE 50 MG PO TABS: 50.0000 mg | ORAL_TABLET | Freq: Every day | ORAL | Status: DC

## 2013-02-21 MED ORDER — SUMATRIPTAN SUCCINATE 50 MG PO TABS: 50.0000 mg | ORAL_TABLET | ORAL | Status: DC | PRN

## 2013-02-21 NOTE — Patient Instructions (Signed)
Migraine Headache A migraine headache is an intense, throbbing pain on one or both sides of your head. A migraine can last for 30 minutes to several hours. CAUSES  The exact cause of a migraine headache is not always known. However, a migraine may be caused when nerves in the brain become irritated and release chemicals that cause inflammation. This causes pain. SYMPTOMS  Pain on one or both sides of your head.  Pulsating or throbbing pain.  Severe pain that prevents daily activities.  Pain that is aggravated by any physical activity.  Nausea, vomiting, or both.  Dizziness.  Pain with exposure to bright lights, loud noises, or activity.  General sensitivity to bright lights, loud noises, or smells. Before you get a migraine, you may get warning signs that a migraine is coming (aura). An aura may include:  Seeing flashing lights.  Seeing bright spots, halos, or zig-zag lines.  Having tunnel vision or blurred vision.  Having feelings of numbness or tingling.  Having trouble talking.  Having muscle weakness. MIGRAINE TRIGGERS  Alcohol.  Smoking.  Stress.  Menstruation.  Aged cheeses.  Foods or drinks that contain nitrates, glutamate, aspartame, or tyramine.  Lack of sleep.  Chocolate.  Caffeine.  Hunger.  Physical exertion.  Fatigue.  Medicines used to treat chest pain (nitroglycerine), birth control pills, estrogen, and some blood pressure medicines. DIAGNOSIS  A migraine headache is often diagnosed based on:  Symptoms.  Physical examination.  A CT scan or MRI of your head. TREATMENT Medicines may be given for pain and nausea. Medicines can also be given to help prevent recurrent migraines.  HOME CARE INSTRUCTIONS  Only take over-the-counter or prescription medicines for pain or discomfort as directed by your caregiver. The use of long-term narcotics is not recommended.  Lie down in a dark, quiet room when you have a migraine.  Keep a journal  to find out what may trigger your migraine headaches. For example, write down:  What you eat and drink.  How much sleep you get.  Any change to your diet or medicines.  Limit alcohol consumption.  Quit smoking if you smoke.  Get 7 to 9 hours of sleep, or as recommended by your caregiver.  Limit stress.  Keep lights dim if bright lights bother you and make your migraines worse. SEEK IMMEDIATE MEDICAL CARE IF:   Your migraine becomes severe.  You have a fever.  You have a stiff neck.  You have vision loss.  You have muscular weakness or loss of muscle control.  You start losing your balance or have trouble walking.  You feel faint or pass out.  You have severe symptoms that are different from your first symptoms. MAKE SURE YOU:   Understand these instructions.  Will watch your condition.  Will get help right away if you are not doing well or get worse. Document Released: 05/03/2005 Document Revised: 07/26/2011 Document Reviewed: 04/23/2011 [employer] Patient Information 2014 [employer].  

## 2013-02-21 NOTE — Progress Notes (Signed)
Subjective:    Patient ID: Amanda Kane, female    DOB: January 26, 1941, 72 y.o.   MRN: [id number]  HPI  Pt presents to the clinic today with c/o headache. She is followed by neurology for the same. She had a recent MRI which showed changes in white matter consistent with chronic headaches. The MRI also showed old lacunar strokes. The neurologist diagnosed her with migraine headaches and told her to take tylenol only. She is very frustrated by this. She did take some of her norco because the pain has been so bad. That is what she was taking for her headaches prior to seeing the neurologist. Tylenol is ineffective and she would like another opinion.  Review of Systems      Past Medical History  Diagnosis Date  . Arthritis   . Esophageal reflux   . Myalgia and myositis, unspecified   . Pure hypercholesterolemia   . Unspecified hypothyroidism   . Colonic polyp     last one around 2008.  Due one now.   . Interstitial cystitis   . Hypertension   . PUD (peptic ulcer disease)   . MVP (mitral valve prolapse)   . Adenopathy 02/25/2012  . Diverticulosis     past diverticulitis; last 2 years ago. no resection.   . Fibromyalgia   . Lymphadenopathy, abdominal 09/01/2012  . Allergy   . Blood transfusion without reported diagnosis   . Cancer     malginant polyps colono 20 years ago  . Eczema     Hx: of  . H/O hiatal hernia   . Headache(784.0)     Hx: of   . 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 amLODipine (NORVASC) 5 MG tablet Take 5 mg by mouth daily.      Marland Kitchen amLODipine (NORVASC) 5 MG tablet TAKE 1 TABLET (5 MG TOTAL) BY MOUTH DAILY.  90 tablet  0  . aspirin 81 MG tablet Take 81 mg by mouth daily.      Marland Kitchen FAMOTIDINE PO Take 1 tablet by mouth daily as needed (acid reflux).      Marland Kitchen FLUoxetine (PROZAC) 20 MG capsule Take 1 capsule by mouth as needed.       . gabapentin (NEURONTIN) 300 MG  capsule Take 1 capsule (300 mg total) by mouth at bedtime.  30 capsule  5  . levothyroxine (SYNTHROID, LEVOTHROID) 75 MCG tablet Take 75 mcg by mouth daily before breakfast.      . Multiple Vitamins-Minerals (MULTIVITAMIN PO) Take 1 tablet by mouth daily.      . phenazopyridine (PYRIDIUM) 100 MG tablet Take 100 mg by mouth 3 (three) times daily as needed (urinary pain).      . SYNTHROID 75 MCG tablet TAKE 1 TABLET (75 MCG TOTAL) BY MOUTH DAILY.  90 tablet  0  . traMADol (ULTRAM) 50 MG tablet Take 1 tablet by mouth as needed.       No current facility-administered medications for this visit.    Allergies  Allergen Reactions  . Atorvastatin Other (See Comments)    "joints ache."  . Celecoxib Other (See Comments)    "burns my stomach."  . Ciprofloxacin Itching  . Ezetimibe     REACTION: Aches  . Lisinopril Cough  . Macrobid [Nitrofurantoin Macrocrystal] Itching  . Metronidazole   . Pregabalin     REACTION: Numb  . Simvastatin     REACTION: Aches  .  Sulfonamide Derivatives Itching    Family History  Problem Relation Age of Onset  . Coronary artery disease Brother     MI at age 21  . Heart failure Mother   . Stroke Father   . Stomach cancer Paternal Grandmother   . Rectal cancer Neg Hx   . Esophageal cancer Neg Hx   . Colon cancer Neg Hx     History   Social History  . Marital Status: Married    Spouse Name: N/A    Number of Children: 2  . Years of Education: N/A   Occupational History  . Retired     retired Runner, broadcasting/film/video   Social History Main Topics  . Smoking status: Never Smoker   . Smokeless tobacco: Never Used  . Alcohol Use: Yes     Comment: Glass wine per night  . Drug Use: No  . Sexual Activity: Yes   Other Topics Concern  . Not on file   Social History Narrative  . No narrative on file     Constitutional: Pt reports headache. Denies fever, malaise, fatigue,  or abrupt weight changes.  HEENT: Denies eye pain, eye redness, ear pain, ringing in the  ears, wax buildup, runny nose, nasal congestion, bloody nose, or sore throat. Neurological: Pt does have mild sensitivity to light/sound. Denies dizziness, difficulty with memory, difficulty with speech or problems with balance and coordination.   No other specific complaints in a complete review of systems (except as listed in HPI above).  Objective:   Physical Exam   BP 128/82  Pulse 85  Temp(Src) 97.9 F (36.6 C) (Oral)  Wt 142 lb 12 oz (64.751 kg)  BMI 25.68 kg/m2  SpO2 97% Wt Readings from Last 3 Encounters:  02/21/13 142 lb 12 oz (64.751 kg)  01/12/13 153 lb (69.4 kg)  12/15/12 154 lb (69.854 kg)    General: Appears her stated age, well developed, well nourished in NAD. HEENT: Head: normal shape and size; Eyes: sclera white, no icterus, conjunctiva pink, PERRLA and EOMs intact; Ears: Tm's gray and intact, normal light reflex; Nose: mucosa pink and moist, septum midline; Throat/Mouth: Teeth present, mucosa pink and moist, no exudate, lesions or ulcerations noted.  Cardiovascular: Normal rate and rhythm. S1,S2 noted.  No murmur, rubs or gallops noted. No JVD or BLE edema. No carotid bruits noted. Pulmonary/Chest: Normal effort and positive vesicular breath sounds. No respiratory distress. No wheezes, rales or ronchi noted.  Neurological: Alert and oriented. Cranial nerves II-XII intact. Coordination normal. +DTRs bilaterally.  BMET    Component Value Date/Time   NA 144 12/15/2012 1403   NA 139 10/11/2012 0935   K 4.1 12/15/2012 1403   K 4.5 10/11/2012 0935   CL 104 10/11/2012 0935   CL 106 08/30/2012 0918   CO2 25 12/15/2012 1403   CO2 29 10/11/2012 0935   GLUCOSE 92 12/15/2012 1403   GLUCOSE 85 10/11/2012 0935   GLUCOSE 100* 08/30/2012 0918   BUN 12.1 12/15/2012 1403   BUN 8 10/11/2012 0935   CREATININE 0.8 12/15/2012 1403   CREATININE 0.8 10/11/2012 0935   CALCIUM 9.8 12/15/2012 1403   CALCIUM 9.8 10/11/2012 0935   GFRNONAA 90* 10/06/2012 0835   GFRAA >90 10/06/2012 0835    Lipid  Panel     Component Value Date/Time   CHOL 232* 05/01/2012 1009   TRIG 221.0* 05/01/2012 1009   HDL 52.80 05/01/2012 1009   CHOLHDL 4 05/01/2012 1009   VLDL 44.2* 05/01/2012 1009  CBC    Component Value Date/Time   WBC 9.0 12/15/2012 1403   WBC 10.1 10/11/2012 0935   RBC 4.69 12/15/2012 1403   RBC 4.54 10/11/2012 0935   HGB 12.8 12/15/2012 1403   HGB 12.3 10/11/2012 0935   HCT 39.1 12/15/2012 1403   HCT 38.0 10/11/2012 0935   PLT 302 12/15/2012 1403   PLT 341.0 10/11/2012 0935   MCV 83.5 12/15/2012 1403   MCV 83.6 10/11/2012 0935   MCH 27.3 12/15/2012 1403   MCH 26.5 10/06/2012 0835   MCHC 32.6 12/15/2012 1403   MCHC 32.4 10/11/2012 0935   RDW 15.6* 12/15/2012 1403   RDW 17.3* 10/11/2012 0935   LYMPHSABS 4.1* 12/15/2012 1403   LYMPHSABS 3.4 10/11/2012 0935   MONOABS 0.7 12/15/2012 1403   MONOABS 1.0 10/11/2012 0935   EOSABS 0.3 12/15/2012 1403   EOSABS 0.3 10/11/2012 0935   BASOSABS 0.1 12/15/2012 1403   BASOSABS 0.1 10/11/2012 0935    Hgb A1C No results found for this basename: HGBA1C         Assessment & Plan:

## 2013-02-22 DIAGNOSIS — G43909 Migraine, unspecified, not intractable, without status migrainosus: Secondary | ICD-10-CM | POA: Insufficient documentation

## 2013-02-22 NOTE — Assessment & Plan Note (Signed)
Will start Topomax for prophylactic migraine  Will start Imitrex as needed to abortice therapy  Will refer to [facility]

## 2013-02-23 ENCOUNTER — Encounter: Payer: Self-pay | Admitting: Family Medicine

## 2013-02-23 ENCOUNTER — Ambulatory Visit (INDEPENDENT_AMBULATORY_CARE_PROVIDER_SITE_OTHER): Payer: Medicare Other | Admitting: Family Medicine

## 2013-02-23 VITALS — BP 128/64 | HR 75 | Temp 98.1°F | Wt 152.5 lb

## 2013-02-23 DIAGNOSIS — G43909 Migraine, unspecified, not intractable, without status migrainosus: Secondary | ICD-10-CM

## 2013-02-23 DIAGNOSIS — K143 Hypertrophy of tongue papillae: Secondary | ICD-10-CM

## 2013-02-23 MED ORDER — FIRST-DUKES MOUTHWASH MT SUSP
OROMUCOSAL | Status: DC
Start: 2013-02-23 — End: 2013-03-23

## 2013-02-23 NOTE — Progress Notes (Signed)
Subjective:    Patient ID: Amanda Kane, female    DOB: May 13, 1941, 72 y.o.   MRN: [id number]  HPI  73 yo pleasant female with history of lymphoma, fibromyalgia syndrome, reflux disease, diverticulosis, peptic ulcer disease, hypothyroidism, interstitial cystitis, neuropathy, and hypertension presents to the clinic today to discuss her neurology appt.  Saw Athar last month.    MRI which showed changes in white matter consistent with chronic headaches. The MRI also showed old lacunar strokes. She diagnosed her with migraine headaches and told her to tylenol only, ASA and Gabapentin. She is very frustrated by this. She reports never having "any stroke symptoms" and would like to know more about this.  HA are daily now, associated with nausea, no vomiting.  No photophobia.  Neuro was also pursing work up with MRA/ doppler/echo for further TIA evaluation given changes on MRI.  She was unpleased with her interaction with neurology.  Saw [facility] yesterday who placed her on topamax for prophylaxis and Imitrex for abortive therapy and referred her to [facility] neurology for a second opinion. Imitrex has not seemed to help yet.  She has also noted that her tongue has been black for two weeks- no changes in medications prior to this starting. Tongue is not painful.  Patient Active Problem List   Diagnosis Date Noted  . Black tongue 02/23/2013  . Migraine 02/22/2013  . ERRONEOUS ENCOUNTER--DISREGARD 02/08/2013  . 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 Gallstones 09/28/2012  . Lymphadenopathy, abdominal 09/01/2012  . Elevated blood pressure 02/03/2012  . Hypertension 02/03/2012  . Mesenteric lymphadenopathy 11/29/2011  . Interstitial cystitis   . MAMMOGRAM, ABNORMAL, LEFT 02/10/2010  . BREAST MASS, RIGHT 01/28/2010  . UNSPECIFIED VITAMIN D DEFICIENCY 11/25/2009  . DIVERTICULOSIS-COLON 06/24/2009  . PERSONAL HX COLONIC POLYPS 06/24/2009  . DYSPHAGIA UNSPECIFIED  11/06/2008  . DYSURIA, CHRONIC 10/16/2008  . BACK PAIN, LUMBAR 10/15/2008  . COLONIC POLYPS 07/02/2008  . DIVERTICULITIS, COLON 10/31/2007  . MALIGNANT NEOPLASM OF COLON UNSPECIFIED SITE 06/21/2007  . HEARING LOSS 06/21/2007  . ARTHRITIS 06/13/2007  . HYPERCHOLESTEROLEMIA, PURE 11/10/2006  . HYPOTHYROIDISM NOS 09/27/2006  . GASTROPARESIS 08/10/2006  . ESOPHAGITIS, MILD 06/08/2006  . ESOPHAGEAL STRICTURE 06/08/2006  . OSTEOPOROSIS 03/17/2000  . HYPERLIPIDEMIA 10/16/1998  . GERD 11/14/1997  . FIBROMYALGIA 05/18/1983   Past Medical History  Diagnosis Date  . Arthritis   . Esophageal reflux   . Myalgia and myositis, unspecified   . Pure hypercholesterolemia   . Unspecified hypothyroidism   . Colonic polyp     last one around 2008.  Due one now.   . Interstitial cystitis   . Hypertension   . PUD (peptic ulcer disease)   . MVP (mitral valve prolapse)   . Adenopathy 02/25/2012  . Diverticulosis     past diverticulitis; last 2 years ago. no resection.   . Fibromyalgia   . Lymphadenopathy, abdominal 09/01/2012  . Allergy   . Blood transfusion without reported diagnosis   . Cancer     malginant polyps colono 20 years ago  . Eczema     Hx: of  . H/O hiatal hernia   . Headache(784.0)     Hx: of   . 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 Hysteroscopy w/d&c      [place]  . Appendectomy    . Inner ear surgery    . Breast lumpectomy  bilateral; benign  . Tubal ligation    . Eye surgery      cateracts  . Tonsillectomy    . Left knee surgery    . Cataract extraction w/ intraocular lens  implant, bilateral      Hx: of  . Colonoscopy w/ biopsies and polypectomy      Hx: of  . Dilation and curettage of uterus    . Cholecystectomy N/A 10/05/2012    Procedure: LAPAROSCOPIC CHOLECYSTECTOMY WITH INTRAOPERATIVE CHOLANGIOGRAM;  Surgeon: [name], MD;  Location: [facility] OR;  Service: General;   Laterality: N/A;  . Lymph node biopsy N/A 10/05/2012    Procedure:  LAPAROSCOPIC EXCISIONAL BIOPSY OF MESENTERIC LYMPH NODE ;  Surgeon: [name], MD;  Location: [facility] OR;  Service: General;  Laterality: N/A;   History  Substance Use Topics  . Smoking status: Never Smoker   . Smokeless tobacco: Never Used  . Alcohol Use: Yes     Comment: Glass wine per night   Family History  Problem Relation Age of Onset  . Coronary artery disease Brother     MI at age 84  . Heart failure Mother   . Stroke Father   . Stomach cancer Paternal Grandmother   . Rectal cancer Neg Hx   . Esophageal cancer Neg Hx   . Colon cancer Neg Hx    Allergies  Allergen Reactions  . Atorvastatin Other (See Comments)    "joints ache."  . Celecoxib Other (See Comments)    "burns my stomach."  . Ciprofloxacin Itching  . Ezetimibe     REACTION: Aches  . Lisinopril Cough  . Macrobid [Nitrofurantoin Macrocrystal] Itching  . Metronidazole   . Pregabalin     REACTION: Numb  . Simvastatin     REACTION: Aches  . Sulfonamide Derivatives Itching   Current Outpatient Prescriptions on File Prior to Visit  Medication Sig Dispense Refill  . amLODipine (NORVASC) 5 MG tablet Take 5 mg by mouth daily.      Marland Kitchen amLODipine (NORVASC) 5 MG tablet TAKE 1 TABLET (5 MG TOTAL) BY MOUTH DAILY.  90 tablet  0  . aspirin 81 MG tablet Take 81 mg by mouth daily.      Marland Kitchen FAMOTIDINE PO Take 1 tablet by mouth daily as needed (acid reflux).      Marland Kitchen FLUoxetine (PROZAC) 20 MG capsule Take 1 capsule by mouth as needed.       . gabapentin (NEURONTIN) 300 MG capsule Take 1 capsule (300 mg total) by mouth at bedtime.  30 capsule  5  . levothyroxine (SYNTHROID, LEVOTHROID) 75 MCG tablet Take 75 mcg by mouth daily before breakfast.      . Multiple Vitamins-Minerals (MULTIVITAMIN PO) Take 1 tablet by mouth daily.      . phenazopyridine (PYRIDIUM) 100 MG tablet Take 100 mg by mouth 3 (three) times daily as needed (urinary pain).      . SUMAtriptan  (IMITREX) 50 MG tablet Take 1 tablet (50 mg total) by mouth every 2 (two) hours as needed for migraine. May repeat in 2 hours if headache persists or recurs.  60 tablet  0  . SYNTHROID 75 MCG tablet TAKE 1 TABLET (75 MCG TOTAL) BY MOUTH DAILY.  90 tablet  0  . topiramate (TOPAMAX) 50 MG tablet Take 1 tablet (50 mg total) by mouth daily.  90 tablet  0  . traMADol (ULTRAM) 50 MG tablet Take 1 tablet by mouth as needed.  No current facility-administered medications on file prior to visit.   The PMH, PSH, Social History, Family History, Medications, and allergies have been reviewed in [facility], and have been updated if relevant.  Review of Systems No focal neurological issues   No difficulty swallowing Does have a headache today- but "better."  Past Medical History  Diagnosis Date  . Arthritis   . Esophageal reflux   . Myalgia and myositis, unspecified   . Pure hypercholesterolemia   . Unspecified hypothyroidism   . Colonic polyp     last one around 2008.  Due one now.   . Interstitial cystitis   . Hypertension   . PUD (peptic ulcer disease)   . MVP (mitral valve prolapse)   . Adenopathy 02/25/2012  . Diverticulosis     past diverticulitis; last 2 years ago. no resection.   . Fibromyalgia   . Lymphadenopathy, abdominal 09/01/2012  . Allergy   . Blood transfusion without reported diagnosis   . Cancer     malginant polyps colono 20 years ago  . Eczema     Hx: of  . H/O hiatal hernia   . Headache(784.0)     Hx: of   . 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 amLODipine (NORVASC) 5 MG tablet Take 5 mg by mouth daily.      Marland Kitchen amLODipine (NORVASC) 5 MG tablet TAKE 1 TABLET (5 MG TOTAL) BY MOUTH DAILY.  90 tablet  0  . aspirin 81 MG tablet Take 81 mg by mouth daily.      Marland Kitchen FAMOTIDINE PO Take 1 tablet by mouth daily as needed (acid reflux).      Marland Kitchen FLUoxetine (PROZAC) 20 MG capsule  Take 1 capsule by mouth as needed.       . gabapentin (NEURONTIN) 300 MG capsule Take 1 capsule (300 mg total) by mouth at bedtime.  30 capsule  5  . levothyroxine (SYNTHROID, LEVOTHROID) 75 MCG tablet Take 75 mcg by mouth daily before breakfast.      . Multiple Vitamins-Minerals (MULTIVITAMIN PO) Take 1 tablet by mouth daily.      . phenazopyridine (PYRIDIUM) 100 MG tablet Take 100 mg by mouth 3 (three) times daily as needed (urinary pain).      . SUMAtriptan (IMITREX) 50 MG tablet Take 1 tablet (50 mg total) by mouth every 2 (two) hours as needed for migraine. May repeat in 2 hours if headache persists or recurs.  60 tablet  0  . SYNTHROID 75 MCG tablet TAKE 1 TABLET (75 MCG TOTAL) BY MOUTH DAILY.  90 tablet  0  . topiramate (TOPAMAX) 50 MG tablet Take 1 tablet (50 mg total) by mouth daily.  90 tablet  0  . traMADol (ULTRAM) 50 MG tablet Take 1 tablet by mouth as needed.       No current facility-administered medications for this visit.    Allergies  Allergen Reactions  . Atorvastatin Other (See Comments)    "joints ache."  . Celecoxib Other (See Comments)    "burns my stomach."  . Ciprofloxacin Itching  . Ezetimibe     REACTION: Aches  . Lisinopril Cough  . Macrobid [Nitrofurantoin Macrocrystal] Itching  . Metronidazole   . Pregabalin     REACTION: Numb  . Simvastatin     REACTION: Aches  . Sulfonamide Derivatives Itching    Family History  Problem Relation Age of Onset  . Coronary  artery disease Brother     MI at age 55  . Heart failure Mother   . Stroke Father   . Stomach cancer Paternal Grandmother   . Rectal cancer Neg Hx   . Esophageal cancer Neg Hx   . Colon cancer Neg Hx     History   Social History  . Marital Status: Married    Spouse Name: N/A    Number of Children: 2  . Years of Education: N/A   Occupational History  . Retired     retired Runner, broadcasting/film/video   Social History Main Topics  . Smoking status: Never Smoker   . Smokeless tobacco: Never Used  .  Alcohol Use: Yes     Comment: Glass wine per night  . Drug Use: No  . Sexual Activity: Yes   Other Topics Concern  . Not on file   Social History Narrative  . No narrative on file     Objective:   Physical Exam BP 128/64  Pulse 75  Temp(Src) 98.1 F (36.7 C) (Oral)  Wt 152 lb 8 oz (69.174 kg)  BMI 27.43 kg/m2  SpO2 97%   There were no vitals taken for this visit. Wt Readings from Last 3 Encounters:  02/21/13 142 lb 12 oz (64.751 kg)  01/12/13 153 lb (69.4 kg)  12/15/12 154 lb (69.854 kg)    General: Appears her stated age, well developed, well nourished in NAD. HEENT: Head: normal shape and size; Eyes: sclera white, no icterus, conjunctiva pink, PERRLA and EOMs intact; Ears: Tm's gray and intact, normal light reflex; Nose: mucosa pink and moist, septum midline; Throat/Mouth: tongue does appear to have a black film on it, otherwise unremarkable. Cardiovascular: Normal rate and rhythm. S1,S2 noted.  No murmur, rubs or gallops noted. No JVD or BLE edema. No carotid bruits noted. Pulmonary/Chest: Normal effort and positive vesicular breath sounds. No respiratory distress. No wheezes, rales or ronchi noted.  Neurological: Alert and oriented. Cranial nerves II-XII intact. Coordination normal. +DTRs bilaterally.     Assessment & Plan:  1. Migraine Agree migraine prophylaxis with topamax and abortive therapy with triptan. Advised to give this time. Also discussed her MRI with her- agree with TIA work up and continued ASA. The patient indicates understanding of these issues and agrees with the plan.   2. Black tongue Unclear etiology-?bacterial or fungal. No recent abx or pepto bismol use.  No changes in medications prior to this starting.  Will try magic mouthwash for 1 week and she will follow up if no resolution.

## 2013-02-26 ENCOUNTER — Telehealth: Payer: Self-pay | Admitting: Family Medicine

## 2013-02-26 NOTE — Telephone Encounter (Signed)
Noted  

## 2013-02-26 NOTE — Telephone Encounter (Signed)
Patient called to let you know her tongue is no longer black.  Patient talked to her pharmacist and the pharmacist asked her if she was taking Pepto Bismol.  She was taking Pepto Bismol and the pharmacist told her that some people have a reaction to one of the ingredients in Pepto Bismol.  Patient stopped taking it and her tongue cleared up.  Patient said she still has the heartburn, but her tongue is better.

## 2013-02-27 ENCOUNTER — Ambulatory Visit (HOSPITAL_COMMUNITY)
Admission: RE | Admit: 2013-02-27 | Discharge: 2013-02-27 | Disposition: A | Discharge status: Home / Self Care | Payer: Medicare Other | Source: Ambulatory Visit | Attending: Oncology | Admitting: Oncology

## 2013-02-27 ENCOUNTER — Other Ambulatory Visit: Payer: Medicare HMO | Admitting: Lab

## 2013-02-27 ENCOUNTER — Other Ambulatory Visit (HOSPITAL_COMMUNITY): Payer: Medicare HMO

## 2013-02-27 DIAGNOSIS — C8589 Other specified types of non-Hodgkin lymphoma, extranodal and solid organ sites: Secondary | ICD-10-CM | POA: Insufficient documentation

## 2013-02-27 DIAGNOSIS — C8303 Small cell B-cell lymphoma, intra-abdominal lymph nodes: Secondary | ICD-10-CM

## 2013-02-27 DIAGNOSIS — I10 Essential (primary) hypertension: Secondary | ICD-10-CM | POA: Insufficient documentation

## 2013-02-28 ENCOUNTER — Other Ambulatory Visit (HOSPITAL_COMMUNITY): Payer: Self-pay | Admitting: Neurology

## 2013-02-28 ENCOUNTER — Ambulatory Visit (HOSPITAL_COMMUNITY): Payer: Medicare Other | Attending: Cardiology

## 2013-02-28 ENCOUNTER — Ambulatory Visit (HOSPITAL_BASED_OUTPATIENT_CLINIC_OR_DEPARTMENT_OTHER): Payer: Medicare Other | Admitting: Cardiology

## 2013-02-28 DIAGNOSIS — I6350 Cerebral infarction due to unspecified occlusion or stenosis of unspecified cerebral artery: Secondary | ICD-10-CM

## 2013-02-28 DIAGNOSIS — I6529 Occlusion and stenosis of unspecified carotid artery: Secondary | ICD-10-CM

## 2013-02-28 DIAGNOSIS — R9089 Other abnormal findings on diagnostic imaging of central nervous system: Secondary | ICD-10-CM

## 2013-02-28 DIAGNOSIS — I6381 Other cerebral infarction due to occlusion or stenosis of small artery: Secondary | ICD-10-CM

## 2013-02-28 DIAGNOSIS — R42 Dizziness and giddiness: Secondary | ICD-10-CM

## 2013-02-28 DIAGNOSIS — Z8673 Personal history of transient ischemic attack (TIA), and cerebral infarction without residual deficits: Secondary | ICD-10-CM | POA: Insufficient documentation

## 2013-02-28 DIAGNOSIS — I10 Essential (primary) hypertension: Secondary | ICD-10-CM | POA: Insufficient documentation

## 2013-02-28 NOTE — Progress Notes (Signed)
Echo performed. 

## 2013-03-01 ENCOUNTER — Telehealth: Payer: Self-pay | Admitting: *Deleted

## 2013-03-01 NOTE — Telephone Encounter (Signed)
Message copied by Gala Romney on Thu Mar 01, 2013 10:31 AM ------      Message from: Levert Feinstein      Created: Tue Feb 27, 2013  8:42 PM       Call pt CXR normal ------

## 2013-03-01 NOTE — Telephone Encounter (Signed)
Per Dr. Cyndie Chime; notified pt's husband CXR normal.  Pt's husband verbalized understanding and states he would tell pt.

## 2013-03-02 NOTE — Progress Notes (Signed)
Quick Note:  Please call patient and advise her that her cardiac echo was reported as normal. We primarily were looking for any evidence of a clot in her heart or wall motion abnormalities or abnormalities in her pump function, all of which were in the normal range which is very reassuring. No other action needs to be done on this test. Please remind patient to keep any other pending appointment tests. Huston Foley, MD, PhD [facility] ([facility]  ______

## 2013-03-05 ENCOUNTER — Ambulatory Visit: Payer: Medicare HMO | Admitting: Oncology

## 2013-03-07 ENCOUNTER — Telehealth: Payer: Self-pay | Admitting: Neurology

## 2013-03-07 NOTE — Telephone Encounter (Signed)
Patient has been informed of normal 2 D echo/dobbler results per patient by Alverda Skeans

## 2013-03-07 NOTE — Progress Notes (Signed)
Quick Note:  I called pt and gave her the results of 2D echo and carotid doppler study results. (both normal). Pt verbalized understanding. ______

## 2013-03-12 ENCOUNTER — Telehealth: Payer: Self-pay | Admitting: Family Medicine

## 2013-03-12 NOTE — Telephone Encounter (Signed)
Caller: Amanda Kane/Patient; Phone: [phone]; Reason for Call: Patient calling to inform Dr Dayton Martes that she stopped taking Amlodopine due to headaches and wants to know what else she can take for her BP.

## 2013-03-16 ENCOUNTER — Ambulatory Visit (INDEPENDENT_AMBULATORY_CARE_PROVIDER_SITE_OTHER): Payer: Medicare Other | Admitting: Neurology

## 2013-03-16 ENCOUNTER — Encounter: Payer: Self-pay | Admitting: Neurology

## 2013-03-16 ENCOUNTER — Other Ambulatory Visit (INDEPENDENT_AMBULATORY_CARE_PROVIDER_SITE_OTHER): Payer: Medicare Other

## 2013-03-16 VITALS — BP 160/84 | HR 78 | Temp 98.2°F | Ht 62.5 in | Wt 155.0 lb

## 2013-03-16 DIAGNOSIS — G43909 Migraine, unspecified, not intractable, without status migrainosus: Secondary | ICD-10-CM

## 2013-03-16 DIAGNOSIS — E7800 Pure hypercholesterolemia, unspecified: Secondary | ICD-10-CM

## 2013-03-16 LAB — LIPID PANEL
HDL: 51.5 mg/dL (ref >39.00)
Total CHOL/HDL Ratio: 4

## 2013-03-16 LAB — LDL CHOLESTEROL, DIRECT: Direct LDL: 136.6 mg/dL

## 2013-03-16 MED ORDER — PREDNISONE 10 MG PO TABS: ORAL_TABLET | ORAL | Status: DC

## 2013-03-16 NOTE — Patient Instructions (Addendum)
1.  We start a prednisone taper to try and break cycle of daily headaches.  Take 6 pills x 1day, then 5 pills x 1 day then 4 pills for 1 day, then 3 pills for 1 day, then 2 pills for 1 day, then 1 pill for 1 day, then 1/2 pill for one day, then STOP.  Take Nexium or Pepcid during this taper to protect your stomach.  2.  Continue topamax 50mg  at bedtime.  Take daily.  Call in 3 weeks with update.  Possible side effects include: impaired thinking, sedation, paresthesias (numbness and tingling) and weight loss.  It may cause dehydration and there is a small risk for kidney stones, so make sure to stay hydrated with water during the day.  There is also a very small risk for glaucoma, so if you notice any change in your vision while taking this medication, see an ophthalmologist.  There is also a very small risk of possible suicidal ideation, as it the case with all antiepileptic medications.  3.  Limit use of pain relievers to no more than 2 to 3 days out of the week.  You may slowly go down frequency each week (take no more than 6 days for 1 week, then no more than 5 days for one week, etc)  These medications include acetaminophen, ibuprofen, triptans and narcotics.  This will help reduce risk of rebound headaches.  You may continue tramadol and tylenol, but do not take Imitrex as these class of medications are contraindicated in patients with history of stroke. 4.  Stay adequately hydrated. 5.  Maintain good sleep hygiene. 6.  Maintain proper stress management. 7.  We will check your cholesterol as part of work up for stroke.  Continue aspirin 81mg  daily. 8.  Call in 3 weeks with update.  Follow up in clinic in 3 months.

## 2013-03-16 NOTE — Progress Notes (Signed)
NEUROLOGY CONSULTATION NOTE  Amanda Kane MRN: [id number] DOB: [birth year]  Referring provider: Dr. Dayton Martes Primary care provider: Dr. Dayton Martes  Reason for consult:  Daily headache.  HISTORY OF PRESENT ILLNESS: Amanda Kane is a 72 year old right-handed woman with fibromyalgia, GERD, PUD, hypertension, small cell B-cell lymphoma, hypercholesterolemia and hypothyroidism who presents for headache.  Records and images were personally reviewed where available.    Onset:  One year ago (around the time she was diagnosed with Non-Hodgkin's lymphoma Location:  Bifrontal, retro-orbital, neck pain, fullness in ears Quality:  Constant dull nonthrobbing with occasional stabbing pain Intensity:  Usually 5/10 but can fluctuate to 9-10/10 Aura: no Associated symptoms:  Nausea and vomiting (has longstanding history of nausea but possibly worse with headache), photophobia, phonophobia. Duration:  Constant but fluctuates Frequency:  daily Triggers/exacerbating factors:  none Relieving factors:  none  Past abortive therapy:  tramadol, oxycodone.  Unable to take NSAIDs due to PUD.  Tylenol (upsets stomach and ineffective) Past preventative therapy:  gabapentin  Current abortive therapy:  Switches between Tylenol and Tramadol (takes once daily).  Tried Imitrex recently but made her sick.  Unable to take NSAIDs due to PUD.  Unable to take Excedrin (makes sick).  Used to take several medications daily. Current preventative therapy:  Topamax 50mg  qd (started about 1.5 weeks ago.  Has been missing doses).  Caffeine:  1 cup coffee per day Sleep hygiene:  Does not sleep well.   Goes to bed at 2am and gets up at 8am.  But doesn't really fall asleep.  Also has periodic limb movements. Stress/depression:  Not now. 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  There is also chronic small vessel ischemic changes.  No acute pathology or abnormal enhancement.  Due to these results, she had a stroke workup.  2D Echo revealed LVEF 60-65% with mild focal basal septal hypertrophy.  Carotid doppler revealed 1-39% bilateral ICA stenosis.  PAST MEDICAL HISTORY: Past Medical History  Diagnosis Date  . Arthritis   . Esophageal reflux   . Myalgia and myositis, unspecified   . Pure hypercholesterolemia   . Unspecified hypothyroidism   . Colonic polyp     last one around 2008.  Due one now.   . Interstitial cystitis   . Hypertension   . PUD (peptic ulcer disease)   . MVP (mitral valve prolapse)   . Adenopathy 02/25/2012  . Diverticulosis     past diverticulitis; last 2 years ago. no resection.   . Fibromyalgia   . Lymphadenopathy, abdominal 09/01/2012  . Allergy   . Blood transfusion without reported diagnosis   . Cancer     malginant polyps colono 20 years ago  . Eczema     Hx: of  . H/O hiatal hernia   . Headache(784.0)     Hx: of   . 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 Hysteroscopy w/d&c      [place]  . Appendectomy    . Inner ear surgery    . Breast lumpectomy      bilateral; benign  . Tubal ligation    . Eye surgery      cateracts  . Tonsillectomy    . Left knee surgery    .  Cataract extraction w/ intraocular lens  implant, bilateral      Hx: of  . Colonoscopy w/ biopsies and polypectomy      Hx: of  . Dilation and curettage of uterus    . Cholecystectomy N/A 10/05/2012    Procedure: LAPAROSCOPIC CHOLECYSTECTOMY WITH INTRAOPERATIVE CHOLANGIOGRAM;  Surgeon: [name], MD;  Location: [facility] OR;  Service: General;  Laterality: N/A;  . Lymph node biopsy N/A 10/05/2012    Procedure:  LAPAROSCOPIC EXCISIONAL BIOPSY OF MESENTERIC LYMPH NODE ;  Surgeon: [name], MD;  Location: [facility] OR;  Service:  General;  Laterality: N/A;    MEDICATIONS: Current Outpatient Prescriptions on File Prior to Visit  Medication Sig Dispense Refill  . aspirin 81 MG tablet Take 81 mg by mouth daily.      Marland Kitchen FLUoxetine (PROZAC) 20 MG capsule Take 1 capsule by mouth as needed.       . Multiple Vitamins-Minerals (MULTIVITAMIN PO) Take 1 tablet by mouth daily.      . phenazopyridine (PYRIDIUM) 100 MG tablet Take 100 mg by mouth 3 (three) times daily as needed (urinary pain).      . SYNTHROID 75 MCG tablet TAKE 1 TABLET (75 MCG TOTAL) BY MOUTH DAILY.  90 tablet  0  . traMADol (ULTRAM) 50 MG tablet Take 1 tablet by mouth as needed.      Marland Kitchen amLODipine (NORVASC) 5 MG tablet Take 5 mg by mouth daily.      . Diphenhyd-Hydrocort-Nystatin (FIRST-DUKES MOUTHWASH) SUSP 15 mL swished in the mouth for 30 sec, then spit out. May be used every 4 to 6 hours.  1 Bottle  0  . FAMOTIDINE PO Take 1 tablet by mouth daily as needed (acid reflux).      . gabapentin (NEURONTIN) 300 MG capsule Take 1 capsule (300 mg total) by mouth at bedtime.  30 capsule  5  . SUMAtriptan (IMITREX) 50 MG tablet Take 1 tablet (50 mg total) by mouth every 2 (two) hours as needed for migraine. May repeat in 2 hours if headache persists or recurs.  60 tablet  0  . topiramate (TOPAMAX) 50 MG tablet Take 1 tablet (50 mg total) by mouth daily.  90 tablet  0   No current facility-administered medications on file prior to visit.    ALLERGIES: Allergies  Allergen Reactions  . Atorvastatin Other (See Comments)    "joints ache."  . Celecoxib Other (See Comments)    "burns my stomach."  . Ciprofloxacin Itching  . Ezetimibe     REACTION: Aches  . Lisinopril Cough  . Macrobid [Nitrofurantoin Macrocrystal] Itching  . Metronidazole   . Pregabalin     REACTION: Numb  . Simvastatin     REACTION: Aches  . Sulfonamide Derivatives Itching    FAMILY HISTORY: Family History  Problem Relation Age of Onset  . Coronary artery disease Brother     MI at age 58   . Heart failure Mother   . Stroke Father   . Stomach cancer Paternal Grandmother   . Rectal cancer Neg Hx   . Esophageal cancer Neg Hx   . Colon cancer Neg Hx     SOCIAL HISTORY: History   Social History  . Marital Status: Married    Spouse Name: N/A    Number of Children: 2  . Years of Education: N/A   Occupational History  . Retired     retired Runner, broadcasting/film/video   Social History Main Topics  . Smoking status: Never Smoker   .  Smokeless tobacco: Never Used  . Alcohol Use: Yes     Comment: Glass wine per night  . Drug Use: No  . Sexual Activity: Yes   Other Topics Concern  . Not on file   Social History Narrative  . No narrative on file    REVIEW OF SYSTEMS: Constitutional: No fevers, chills, or sweats, no generalized fatigue, change in appetite Eyes: No visual changes, double vision, eye pain Ear, nose and throat: No hearing loss, ear pain, nasal congestion, sore throat Cardiovascular: No chest pain, palpitations Respiratory:  No shortness of breath at rest or with exertion, wheezes GastrointestinaI: No nausea, vomiting, diarrhea, abdominal pain, fecal incontinence Genitourinary:  No dysuria, urinary retention or frequency Musculoskeletal:  No neck pain, back pain Integumentary: No rash, pruritus, skin lesions Neurological: as above Psychiatric: No depression, insomnia, anxiety Endocrine: No palpitations, fatigue, diaphoresis, mood swings, change in appetite, change in weight, increased thirst Hematologic/Lymphatic:  No anemia, purpura, petechiae. Allergic/Immunologic: no itchy/runny eyes, nasal congestion, recent allergic reactions, rashes  PHYSICAL EXAM: Filed Vitals:   03/16/13 0931  BP: 160/84  Pulse: 78  Temp: 98.2 F (36.8 C)   General: No acute distress Head:  Normocephalic/atraumatic Neck: supple, no paraspinal tenderness, full range of motion Back: No paraspinal tenderness Heart: regular rate and rhythm Lungs: Clear to auscultation  bilaterally. Vascular: No carotid bruits. Neurological Exam: Mental status: alert and oriented to person, place, and time, speech fluent and not dysarthric, language intact. Cranial nerves: CN I: not tested CN II: pupils equal, round and reactive to light, visual fields intact, fundi unremarkable. CN III, IV, VI:  full range of motion, no nystagmus, no ptosis CN V: facial sensation intact CN VII: upper and lower face symmetric CN VIII: hearing intact CN IX, X: gag intact, uvula midline CN XI: sternocleidomastoid and trapezius muscles intact CN XII: tongue midline Bulk & Tone: normal, no fasciculations. Motor: 5/5 throughout Sensation: temperature and vibration intact Deep Tendon Reflexes: 2+ throughout, toes down Finger to nose testing: normal Heel to shin: normal Gait: normal stance and stride.  Able to walk in tandem. Romberg negative.  IMPRESSION: 1.  Chronic daily headache.  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  Unusual age for onset. 2.  Cerebrovascular disease.  PLAN: 1.  I would continue Topamax 50mg  qhs for preventative therapy and instructed to take daily.  Call in 3 weeks with update. 2. Unfortunately, we are limited in regards to abortive therapy.  NSAIDs are contraindicated due to PUD.  I would stop Imitrex, as triptans are also contraindicated in patients with cerebrovascular disease.  Continue Tylenol or Tramadol for now. 3. Limit use of pain relievers to no more than 2-3 days out of week to prevent rebound headache. 4. 7 day prednisone taper to break cycle.  Advised to use her PPI daily during this taper. 5. Keep hydrated. 6. Discuss with PCP regarding ways to optimize sleep problem. 7. Continue ASA 81mg  daily for secondary stroke prevention. 8. Check fasting lipid panel. 9. Follow up in 3 months.  Thank you for allowing me to take part in the care of this patient.  Shon Millet, DO  CC:  Ruthe Mannan, MD

## 2013-03-19 ENCOUNTER — Encounter: Payer: Self-pay | Admitting: Internal Medicine

## 2013-03-19 ENCOUNTER — Ambulatory Visit (INDEPENDENT_AMBULATORY_CARE_PROVIDER_SITE_OTHER): Payer: Medicare Other | Admitting: Internal Medicine

## 2013-03-19 VITALS — BP 160/90 | HR 64 | Temp 98.0°F | Ht 62.5 in | Wt 155.0 lb

## 2013-03-19 DIAGNOSIS — R300 Dysuria: Secondary | ICD-10-CM

## 2013-03-19 DIAGNOSIS — I10 Essential (primary) hypertension: Secondary | ICD-10-CM

## 2013-03-19 DIAGNOSIS — E785 Hyperlipidemia, unspecified: Secondary | ICD-10-CM

## 2013-03-19 MED ORDER — ATENOLOL 25 MG PO TABS: 25.0000 mg | ORAL_TABLET | Freq: Every day | ORAL | Status: DC

## 2013-03-19 MED ORDER — PHENAZOPYRIDINE HCL 100 MG PO TABS: 100.0000 mg | ORAL_TABLET | Freq: Three times a day (TID) | ORAL | Status: AC | PRN

## 2013-03-19 NOTE — Assessment & Plan Note (Addendum)
Elevated today Will start Atenolol 25 mg daily, D/C norvasc Work on diet and exercise  RTC in 2 weeks to recheck BP

## 2013-03-19 NOTE — Patient Instructions (Signed)

## 2013-03-19 NOTE — Assessment & Plan Note (Signed)
Take Fish oil supplement OTC Work on a low fat, low cholesterol diet  Lets recheck triglycerides in 3 months

## 2013-03-19 NOTE — Progress Notes (Signed)
Subjective:    Patient ID: Amanda Kane, female    DOB: 04/20/1941, 72 y.o.   MRN: [id number]  HPI  Pt presents to the clinic today for HTN f/u. She reports that she stopped taking her Amlodipine on secondary to headaches. She did stop it and her headaches have resolved. Her BP today in 160/90. Amlodipine was the only medication she was on for her BP. She would like to discuss other treatment options. She denies dizziness, chest pain, chest tightness or shortness of breath. She has been on atenolol in the past which worked very well for her. She would like to go back on that.] Of note, her triglycerides are > 300. She is statin intolerant. She cannot take zetia and niacin upsets her stomach. She would like to know what she can take to get this down. Additionally, she would like a refill today of her pyridium. This helps with her chronic dysuria secondary to interstitial cystis.  Review of Systems      Past Medical History  Diagnosis Date  . Arthritis   . Esophageal reflux   . Myalgia and myositis, unspecified   . Pure hypercholesterolemia   . Unspecified hypothyroidism   . Colonic polyp     last one around 2008.  Due one now.   . Interstitial cystitis   . Hypertension   . PUD (peptic ulcer disease)   . MVP (mitral valve prolapse)   . Adenopathy 02/25/2012  . Diverticulosis     past diverticulitis; last 2 years ago. no resection.   . Fibromyalgia   . Lymphadenopathy, abdominal 09/01/2012  . Allergy   . Blood transfusion without reported diagnosis   . Cancer     malginant polyps colono 20 years ago  . Eczema     Hx: of  . H/O hiatal hernia   . Headache(784.0)     Hx: of   . 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 aspirin 81 MG tablet Take 81 mg by mouth daily.      Marland Kitchen FAMOTIDINE PO Take 1 tablet by mouth daily as needed (acid reflux).      Marland Kitchen FLUoxetine (PROZAC) 20 MG capsule  Take 1 capsule by mouth as needed.       . Multiple Vitamins-Minerals (MULTIVITAMIN PO) Take 1 tablet by mouth daily.      . phenazopyridine (PYRIDIUM) 100 MG tablet Take 100 mg by mouth 3 (three) times daily as needed (urinary pain).      . predniSONE (DELTASONE) 10 MG tablet Take 6tbs x1d, then 5tbs x1d, then 4tbs x1d, then 3tbs x1d, then 2tbs x1d, then 1tb x1d, then 0.5tb x1d, then STOP  22 tablet  0  . SYNTHROID 75 MCG tablet TAKE 1 TABLET (75 MCG TOTAL) BY MOUTH DAILY.  90 tablet  0  . topiramate (TOPAMAX) 50 MG tablet Take 1 tablet (50 mg total) by mouth daily.  90 tablet  0  . traMADol (ULTRAM) 50 MG tablet Take 1 tablet by mouth as needed.      Marland Kitchen amLODipine (NORVASC) 5 MG tablet Take 5 mg by mouth daily.      . Diphenhyd-Hydrocort-Nystatin (FIRST-DUKES MOUTHWASH) SUSP 15 mL swished in the mouth for 30 sec, then spit out. May be used every 4 to 6 hours.  1 Bottle  0  . gabapentin (NEURONTIN) 300 MG capsule Take 1 capsule (300 mg total) by mouth at bedtime.  30 capsule  5   No current facility-administered medications for this visit.    Allergies  Allergen Reactions  . Atorvastatin Other (See Comments)    "joints ache."  . Celecoxib Other (See Comments)    "burns my stomach."  . Ciprofloxacin Itching  . Ezetimibe     REACTION: Aches  . Lisinopril Cough  . Macrobid [Nitrofurantoin Macrocrystal] Itching  . Metronidazole   . Pregabalin     REACTION: Numb  . Simvastatin     REACTION: Aches  . Sulfonamide Derivatives Itching    Family History  Problem Relation Age of Onset  . Coronary artery disease Brother     MI at age 39  . Heart failure Mother   . Stroke Father   . Stomach cancer Paternal Grandmother   . Rectal cancer Neg Hx   . Esophageal cancer Neg Hx   . Colon cancer Neg Hx     History   Social History  . Marital Status: Married    Spouse Name: N/A    Number of Children: 2  . Years of Education: N/A   Occupational History  . Retired     retired Runner, broadcasting/film/video    Social History Main Topics  . Smoking status: Never Smoker   . Smokeless tobacco: Never Used  . Alcohol Use: Yes     Comment: Glass wine per night  . Drug Use: No  . Sexual Activity: Yes   Other Topics Concern  . Not on file   Social History Narrative  . No narrative on file     Constitutional: Denies fever, malaise, fatigue, or abrupt weight changes.  Respiratory: Denies difficulty breathing, shortness of breath, cough or sputum production.   Cardiovascular: Denies chest pain, chest tightness, palpitations or swelling in the hands or feet.  Neurological: Denies dizziness, difficulty with memory, difficulty with speech or problems with balance and coordination.   No other specific complaints in a complete review of systems (except as listed in HPI above).  Objective:   Physical Exam   BP 160/90  Pulse 64  Temp(Src) 98 F (36.7 C) (Oral)  Ht 5' 2.5" (1.588 m)  Wt 155 lb (70.308 kg)  BMI 27.88 kg/m2 Wt Readings from Last 3 Encounters:  03/19/13 155 lb (70.308 kg)  03/16/13 155 lb (70.308 kg)  02/23/13 152 lb 8 oz (69.174 kg)    General: Appears her stated age, well developed, well nourished in NAD. Cardiovascular: Normal rate and rhythm. S1,S2 noted.  No murmur, rubs or gallops noted. No JVD or BLE edema. No carotid bruits noted. Pulmonary/Chest: Normal effort and positive vesicular breath sounds. No respiratory distress. No wheezes, rales or ronchi noted.  Neurological: Alert and oriented. Cranial nerves II-XII intact. Coordination normal. +DTRs bilaterally.   BMET    Component Value Date/Time   NA 144 12/15/2012 1403   NA 139 10/11/2012 0935   K 4.1 12/15/2012 1403   K 4.5 10/11/2012 0935   CL 104 10/11/2012 0935   CL 106 08/30/2012 0918   CO2 25 12/15/2012 1403   CO2 29 10/11/2012 0935   GLUCOSE 92 12/15/2012 1403   GLUCOSE 85 10/11/2012 0935   GLUCOSE 100* 08/30/2012 0918   BUN 12.1 12/15/2012 1403   BUN 8 10/11/2012 0935   CREATININE 0.8 12/15/2012 1403   CREATININE  0.8 10/11/2012 0935   CALCIUM 9.8 12/15/2012 1403   CALCIUM 9.8 10/11/2012 0935   GFRNONAA 90* 10/06/2012 0835   GFRAA >90 10/06/2012 0835    Lipid Panel  Component Value Date/Time   CHOL 230* 03/16/2013 1441   TRIG 290.0* 03/16/2013 1441   HDL 51.50 03/16/2013 1441   CHOLHDL 4 03/16/2013 1441   VLDL 58.0* 03/16/2013 1441    CBC    Component Value Date/Time   WBC 9.0 12/15/2012 1403   WBC 10.1 10/11/2012 0935   RBC 4.69 12/15/2012 1403   RBC 4.54 10/11/2012 0935   HGB 12.8 12/15/2012 1403   HGB 12.3 10/11/2012 0935   HCT 39.1 12/15/2012 1403   HCT 38.0 10/11/2012 0935   PLT 302 12/15/2012 1403   PLT 341.0 10/11/2012 0935   MCV 83.5 12/15/2012 1403   MCV 83.6 10/11/2012 0935   MCH 27.3 12/15/2012 1403   MCH 26.5 10/06/2012 0835   MCHC 32.6 12/15/2012 1403   MCHC 32.4 10/11/2012 0935   RDW 15.6* 12/15/2012 1403   RDW 17.3* 10/11/2012 0935   LYMPHSABS 4.1* 12/15/2012 1403   LYMPHSABS 3.4 10/11/2012 0935   MONOABS 0.7 12/15/2012 1403   MONOABS 1.0 10/11/2012 0935   EOSABS 0.3 12/15/2012 1403   EOSABS 0.3 10/11/2012 0935   BASOSABS 0.1 12/15/2012 1403   BASOSABS 0.1 10/11/2012 0935    Hgb A1C No results found for this basename: HGBA1C        Assessment & Plan:

## 2013-03-20 ENCOUNTER — Telehealth: Payer: Self-pay

## 2013-03-20 NOTE — Telephone Encounter (Signed)
Pt was seen 03/19/13 started on atenolol, pt took atenolol before bedtime last night; pt said woke up this morning with migraine h/a; that was the reason pt stopped amlodipine because it gave pt h/a. Advised pt not to take anymore of atenolol until hears from Nicki Reaper NP. Pt request a different med that will not cause h/a. Please advise. [facility].

## 2013-03-20 NOTE — Telephone Encounter (Signed)
Her headaches may be coming for her elevated blood pressure. She needs to continue with the atenolol x 2 weeks and then we can address if continues to have headaches.

## 2013-03-21 NOTE — Telephone Encounter (Signed)
Pt has just taken the one atenolol; pt did not take atenolol last night but woke up this morning with h/a (h/a not as bad as yesterday morning). Pt does not want to continue atenolol and wants something different than beta blocker.[facility].Please advise.

## 2013-03-22 ENCOUNTER — Other Ambulatory Visit: Payer: Self-pay | Admitting: Internal Medicine

## 2013-03-22 MED ORDER — HYDROCHLOROTHIAZIDE 25 MG PO TABS: 25.0000 mg | ORAL_TABLET | Freq: Every day | ORAL | Status: DC

## 2013-03-22 NOTE — Telephone Encounter (Signed)
Changed to hctz 25 mg daily

## 2013-03-22 NOTE — Telephone Encounter (Signed)
Patient notified as instructed by telephone. [facility] willget med at [facility] and will call back if needed.

## 2013-03-23 ENCOUNTER — Encounter: Payer: Self-pay | Admitting: Family Medicine

## 2013-03-23 ENCOUNTER — Ambulatory Visit (INDEPENDENT_AMBULATORY_CARE_PROVIDER_SITE_OTHER): Payer: Medicare Other | Admitting: Family Medicine

## 2013-03-23 VITALS — BP 142/88 | HR 64 | Temp 98.3°F | Ht 62.5 in | Wt 152.8 lb

## 2013-03-23 DIAGNOSIS — I10 Essential (primary) hypertension: Secondary | ICD-10-CM

## 2013-03-23 MED ORDER — LOSARTAN POTASSIUM 50 MG PO TABS: 50.0000 mg | ORAL_TABLET | Freq: Every day | ORAL | Status: DC

## 2013-03-23 NOTE — Progress Notes (Signed)
Pre-visit discussion using our clinic review tool. No additional management support is needed unless otherwise documented below in the visit note.  

## 2013-03-23 NOTE — Patient Instructions (Signed)
Drink lots of water and avoid sodium  Stop the hctz  Start losartan 1 pill daily for high blood pressure  Follow up in 2-4 weeks   If any side effects or problems please call and let us know

## 2013-03-23 NOTE — Progress Notes (Signed)
Subjective:    Patient ID: Amanda Kane, female    DOB: 21-Nov-1940, 72 y.o.   MRN: [id number]  HPI Here for bp issues  Lisinopril caused cough  Amlodipine - made migraines worse  Atenolol -made migraines worse    hctz tried next - took yesterday and this am -- and has a headache came back   She also has IC - and those symptoms also get worse with higher bp   Has had a cardiac work up - and everything looks good  Also new dx of non hodgkins lymphoma   Has hx of high cholesterol Told to take   Patient Active Problem List   Diagnosis Date Noted  . Migraine 02/22/2013  . 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 Gallstones 09/28/2012  . Hypertension 02/03/2012  . Mesenteric lymphadenopathy 11/29/2011  . BREAST MASS, RIGHT 01/28/2010  . UNSPECIFIED VITAMIN D DEFICIENCY 11/25/2009  . DIVERTICULOSIS-COLON 06/24/2009  . BACK PAIN, LUMBAR 10/15/2008  . COLONIC POLYPS 07/02/2008  . HEARING LOSS 06/21/2007  . ARTHRITIS 06/13/2007  . HYPOTHYROIDISM NOS 09/27/2006  . GASTROPARESIS 08/10/2006  . ESOPHAGEAL STRICTURE 06/08/2006  . OSTEOPOROSIS 03/17/2000  . HYPERLIPIDEMIA 10/16/1998  . GERD 11/14/1997  . FIBROMYALGIA 05/18/1983   Past Medical History  Diagnosis Date  . Arthritis   . Esophageal reflux   . Myalgia and myositis, unspecified   . Pure hypercholesterolemia   . Unspecified hypothyroidism   . Colonic polyp     last one around 2008.  Due one now.   . Interstitial cystitis   . Hypertension   . PUD (peptic ulcer disease)   . MVP (mitral valve prolapse)   . Adenopathy 02/25/2012  . Diverticulosis     past diverticulitis; last 2 years ago. no resection.   . Fibromyalgia   . Lymphadenopathy, abdominal 09/01/2012  . Allergy   . Blood transfusion without reported diagnosis   . Cancer     malginant polyps colono 20 years ago  . Eczema     Hx: of  . H/O hiatal hernia   . Headache(784.0)     Hx: of   . Small cell B-cell lymphoma of  intra-abdominal lymph nodes 10/12/2012   Past Surgical History  Procedure Laterality Date  . Hysteroscopy w/d&c      [facility]  . Appendectomy    . Inner ear surgery    . Breast lumpectomy      bilateral; benign  . Tubal ligation    . Eye surgery      cateracts  . Tonsillectomy    . Left knee surgery    . Cataract extraction w/ intraocular lens  implant, bilateral      Hx: of  . Colonoscopy w/ biopsies and polypectomy      Hx: of  . Dilation and curettage of uterus    . Cholecystectomy N/A 10/05/2012    Procedure: LAPAROSCOPIC CHOLECYSTECTOMY WITH INTRAOPERATIVE CHOLANGIOGRAM;  Surgeon: [name], MD;  Location: [facility] OR;  Service: General;  Laterality: N/A;  . Lymph node biopsy N/A 10/05/2012    Procedure:  LAPAROSCOPIC EXCISIONAL BIOPSY OF MESENTERIC LYMPH NODE ;  Surgeon: [name], MD;  Location: [facility] OR;  Service: General;  Laterality: N/A;   History  Substance Use Topics  . Smoking status: Never Smoker   . Smokeless tobacco: Never Used  . Alcohol Use: Yes     Comment: Glass wine per night   Family History  Problem Relation Age of Onset  .  Coronary artery disease Brother     MI at age 26  . Heart failure Mother   . Stroke Father   . Stomach cancer Paternal Grandmother   . Rectal cancer Neg Hx   . Esophageal cancer Neg Hx   . Colon cancer Neg Hx    Allergies  Allergen Reactions  . Atorvastatin Other (See Comments)    "joints ache."  . Celecoxib Other (See Comments)    "burns my stomach."  . Ciprofloxacin Itching  . Ezetimibe     REACTION: Aches  . Lisinopril Cough  . Macrobid [Nitrofurantoin Macrocrystal] Itching  . Metronidazole   . Pregabalin     REACTION: Numb  . Simvastatin     REACTION: Aches  . Sulfonamide Derivatives Itching   Current Outpatient Prescriptions on File Prior to Visit  Medication Sig Dispense Refill  . aspirin 81 MG tablet Take 81 mg by mouth daily.      Marland Kitchen FAMOTIDINE PO Take 1 tablet by mouth daily as needed (acid reflux).       Marland Kitchen FLUoxetine (PROZAC) 20 MG capsule Take 1 capsule by mouth as needed.       . hydrochlorothiazide (HYDRODIURIL) 25 MG tablet Take 1 tablet (25 mg total) by mouth daily.  30 tablet  0  . Multiple Vitamins-Minerals (MULTIVITAMIN PO) Take 1 tablet by mouth daily.      . phenazopyridine (PYRIDIUM) 100 MG tablet Take 1 tablet (100 mg total) by mouth 3 (three) times daily as needed (urinary pain).  10 tablet  1  . predniSONE (DELTASONE) 10 MG tablet Take 6tbs x1d, then 5tbs x1d, then 4tbs x1d, then 3tbs x1d, then 2tbs x1d, then 1tb x1d, then 0.5tb x1d, then STOP  22 tablet  0  . SYNTHROID 75 MCG tablet TAKE 1 TABLET (75 MCG TOTAL) BY MOUTH DAILY.  90 tablet  0  . topiramate (TOPAMAX) 50 MG tablet Take 1 tablet (50 mg total) by mouth daily.  90 tablet  0  . traMADol (ULTRAM) 50 MG tablet Take 1 tablet by mouth as needed.       No current facility-administered medications on file prior to visit.      Review of Systems Review of Systems  Constitutional: Negative for fever, appetite change, fatigue and unexpected weight change.  Eyes: Negative for pain and visual disturbance.  Respiratory: Negative for cough and shortness of breath.   Cardiovascular: Negative for cp or palpitations    Gastrointestinal: Negative for nausea, diarrhea and constipation.  Genitourinary: Negative for urgency and frequency. (pos for intermittent dysuria from IC) Skin: Negative for pallor or rash   Neurological: Negative for weakness, light-headedness, numbness and pos for chronic headaches  Hematological: Negative for adenopathy. Does not bruise/bleed easily.  Psychiatric/Behavioral: Negative for dysphoric mood. The patient is not nervous/anxious.         Objective:   Physical Exam  Constitutional: She appears well-developed and well-nourished. No distress.  overwt and well app  HENT:  Head: Normocephalic and atraumatic.  Mouth/Throat: Oropharynx is clear and moist.  Eyes: Conjunctivae and EOM are normal. Pupils  are equal, round, and reactive to light. Right eye exhibits no discharge. Left eye exhibits no discharge. No scleral icterus.  Neck: Normal range of motion. Neck supple. No JVD present. Carotid bruit is not present. No thyromegaly present.  Cardiovascular: Normal rate, regular rhythm, normal heart sounds and intact distal pulses.  Exam reveals no gallop.   Pulmonary/Chest: Effort normal and breath sounds normal. No respiratory distress. She has  no wheezes. She has no rales.  Abdominal: Soft. Bowel sounds are normal. She exhibits no abdominal bruit.  Musculoskeletal: She exhibits no edema.  Lymphadenopathy:    She has no cervical adenopathy.  Neurological: She is alert. She has normal reflexes. No cranial nerve deficit. She exhibits normal muscle tone. Coordination normal.  Skin: Skin is warm and dry. No pallor.  Psychiatric: She has a normal mood and affect.          Assessment & Plan:

## 2013-03-25 NOTE — Assessment & Plan Note (Signed)
So far intolerant of amlodipine/ atenolol and hctz Gets cough with ace BP: 142/88 mmHg   Rev last labs wil try losartan to see if we can get bp control without cough F/u planned Disc lifestyle change for HTN

## 2013-04-02 ENCOUNTER — Telehealth: Payer: Self-pay

## 2013-04-02 DIAGNOSIS — I10 Essential (primary) hypertension: Secondary | ICD-10-CM

## 2013-04-02 MED ORDER — VERAPAMIL HCL ER 100 MG PO CP24: 100.0000 mg | ORAL_CAPSULE | Freq: Every day | ORAL | Status: DC

## 2013-04-02 NOTE — Telephone Encounter (Signed)
I want to try one more medicine - in low dose (verapamil) - watch out for low bp or dizziness If any side eff please call If this does not work out -we may consider ref to cardiology to help with bp management since you are intolerant to several medicines  [facility]  I hope the headache improves soon  Stay of the losartan

## 2013-04-02 NOTE — Telephone Encounter (Signed)
Pt notified Rx sent to pharmacy and pt advise of Dr. Royden Purl comments, pt verbalized understanding

## 2013-04-02 NOTE — Telephone Encounter (Signed)
Pt left v/m; pt started losartan last week and for 3 days pt felt wonderful,no headaches; pt said h/a started and intensified so pt said on 03/31/13 stopped losartan due to intensity of h/as.pt request a different med for BP to [facility]. Pt still has h/a even though stopped losartan on Sat.Please advise.

## 2013-04-09 ENCOUNTER — Telehealth: Payer: Self-pay | Admitting: Neurology

## 2013-04-09 NOTE — Telephone Encounter (Signed)
Patient left me a vm message at 10:00 am re: HA coming from her BP medications she thought. I called her back and left her a message letting her know that I would let Dr. Everlena Cooper know that she called although he is out of the office until Dec 1st. Asked that she call me back if she needed to discuss anything further with me before his return. **Dr. Everlena Cooper just a FYI.Marland KitchenMarland KitchenMarland Kitchen

## 2013-04-10 NOTE — Telephone Encounter (Signed)
Stop it  Since she is intolerant to so many bp medicines -I want to refer her to cardiology for help with HTN management Let me know if agreeable and I will do the referral

## 2013-04-10 NOTE — Telephone Encounter (Signed)
Pt left v/m that the last BP med (Verapamil) caused migraine h/a and pt stopped medication. Pt request cb.

## 2013-04-11 NOTE — Telephone Encounter (Signed)
Pt agrees with referral to cardiologist, pt has seen [facility] in past and wants to go back there

## 2013-04-13 NOTE — Addendum Note (Signed)
Addended by: Roxy Manns A on: 04/13/2013 08:04 AM   Modules accepted: Orders

## 2013-04-13 NOTE — Telephone Encounter (Signed)
Referral to cardiology for difficult to manage HTN

## 2013-05-11 ENCOUNTER — Encounter: Payer: Self-pay | Admitting: Cardiology

## 2013-05-11 ENCOUNTER — Telehealth: Payer: Self-pay | Admitting: *Deleted

## 2013-05-11 ENCOUNTER — Ambulatory Visit (INDEPENDENT_AMBULATORY_CARE_PROVIDER_SITE_OTHER): Payer: Medicare Other | Admitting: Cardiology

## 2013-05-11 ENCOUNTER — Other Ambulatory Visit: Payer: Self-pay | Admitting: Family Medicine

## 2013-05-11 VITALS — BP 146/88 | HR 68 | Ht 62.5 in | Wt 156.0 lb

## 2013-05-11 DIAGNOSIS — I10 Essential (primary) hypertension: Secondary | ICD-10-CM

## 2013-05-11 DIAGNOSIS — E785 Hyperlipidemia, unspecified: Secondary | ICD-10-CM

## 2013-05-11 MED ORDER — LOSARTAN POTASSIUM 50 MG PO TABS: 50.0000 mg | ORAL_TABLET | Freq: Every day | ORAL | Status: AC

## 2013-05-11 NOTE — Assessment & Plan Note (Signed)
Management per primary care. 

## 2013-05-11 NOTE — Progress Notes (Signed)
HPI: followup Hypertension. I saw the patient initially in September of 2013 with chest pain. There was also a question history of mitral valve prolapse. Nuclear study in October of 2013 showed normal perfusion and an ejection fraction of 81%. Echocardiogram in October 2014 showed normal LV function, grade 1 diastolic dysfunction, mild left atrial enlargement and trace mitral regurgitation. Carotid Dopplers in October of 2014 showed 1-39% bilateral stenosis. Patient has been seen by primary care for hypertension. She has not tolerated multiple medications. Lisinopril apparently caused a cough. Amlodipine made migraines worse. Atenolol made migraines worse. HCTZ caused headaches. Cardiology now asked to evaluate. Patient has chronic chest tightness that is essentially continuous. She has dyspnea on exertion which is unchanged. She states her blood pressure typically runs 160/90.   Current Outpatient Prescriptions  Medication Sig Dispense Refill  . aspirin 81 MG tablet Take 81 mg by mouth daily.      Marland Kitchen FAMOTIDINE PO Take 1 tablet by mouth daily as needed (acid reflux).      Marland Kitchen FLUoxetine (PROZAC) 20 MG capsule Take 1 capsule by mouth.       . Multiple Vitamins-Minerals (MULTIVITAMIN PO) Take 1 tablet by mouth daily.      . phenazopyridine (PYRIDIUM) 100 MG tablet Take 1 tablet (100 mg total) by mouth 3 (three) times daily as needed (urinary pain).  10 tablet  1  . SYNTHROID 75 MCG tablet TAKE 1 TABLET (75 MCG TOTAL) BY MOUTH DAILY.  90 tablet  0  . traMADol (ULTRAM) 50 MG tablet Take 1 tablet by mouth as needed.       No current facility-administered medications for this visit.     Past Medical History  Diagnosis Date  . Arthritis   . Esophageal reflux   . Myalgia and myositis, unspecified   . Pure hypercholesterolemia   . Unspecified hypothyroidism   . Colonic polyp     last one around 2008.  Due one now.   . Interstitial cystitis   . Hypertension   . PUD (peptic ulcer disease)     . MVP (mitral valve prolapse)   . Adenopathy 02/25/2012  . Diverticulosis     past diverticulitis; last 2 years ago. no resection.   . Fibromyalgia   . Lymphadenopathy, abdominal 09/01/2012  . Allergy   . Blood transfusion without reported diagnosis   . Cancer     malginant polyps colono 20 years ago  . Eczema     Hx: of  . H/O hiatal hernia   . Headache(784.0)     Hx: of   . 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 Hysteroscopy w/d&c      [facility]  . Appendectomy    . Inner ear surgery    . Breast lumpectomy      bilateral; benign  . Tubal ligation    . Eye surgery      cateracts  . Tonsillectomy    . Left knee surgery    . Cataract extraction w/ intraocular lens  implant, bilateral      Hx: of  . Colonoscopy w/ biopsies and polypectomy      Hx: of  . Dilation and curettage of uterus    . Cholecystectomy N/A 10/05/2012    Procedure: LAPAROSCOPIC CHOLECYSTECTOMY WITH INTRAOPERATIVE CHOLANGIOGRAM;  Surgeon: [name], MD;  Location: [facility] OR;  Service: General;  Laterality: N/A;  . Lymph node  biopsy N/A 10/05/2012    Procedure:  LAPAROSCOPIC EXCISIONAL BIOPSY OF MESENTERIC LYMPH NODE ;  Surgeon: [name], MD;  Location: [facility] OR;  Service: General;  Laterality: N/A;    History   Social History  . Marital Status: Married    Spouse Name: N/A    Number of Children: 2  . Years of Education: N/A   Occupational History  . Retired     retired Runner, broadcasting/film/video   Social History Main Topics  . Smoking status: Never Smoker   . Smokeless tobacco: Never Used  . Alcohol Use: Yes     Comment: Glass wine per night  . Drug Use: No  . Sexual Activity: Yes   Other Topics Concern  . Not on file   Social History Narrative  . No narrative on file    ROS: chest tightness, dyspnea, arthritis but no fevers or chills, productive cough, hemoptysis, dysphasia, odynophagia, melena, hematochezia,  dysuria, hematuria, rash, seizure activity, orthopnea, PND, pedal edema, claudication. Remaining systems are negative.  Physical Exam: Well-developed well-nourished in no acute distress.  Skin is warm and dry.  HEENT is normal.  Neck is supple.  Chest is clear to auscultation with normal expansion.  Cardiovascular exam is regular rate and rhythm.  Abdominal exam nontender or distended. No masses palpated. Extremities show no edema. neuro grossly intact  ECG sinus rhythm at a rate of 68. Left ventricular hypertrophy. Cannot rule out prior septal infarct.

## 2013-05-11 NOTE — Telephone Encounter (Signed)
SPOKE TO MARIE IN THE LAB. PT. IS HAVING A CBC AND A CMET DRAWN ON 05/28/13. A BMET IS INCLUDED IN THE CMET WHICH DR.CRENSHAW MAY VIEW THE RESULTS IN EPIC. NOTIFIED SHARON. SHE WILL INFROM DR.CRENSHAW'S NURSE.

## 2013-05-11 NOTE — Patient Instructions (Addendum)
Your physician recommends that you schedule a follow-up appointment in: 8 WEEKS WITH DR CRENSHAW  START LOSARTAN 50 MG ONCE DAILY  Your physician recommends that you HAVE LAB WORK IN ONE WEEK

## 2013-05-11 NOTE — Assessment & Plan Note (Signed)
Patient's blood pressure is mildly elevated. She is having headaches regardless of medications although worse with antihypertensives. I will try Cozaar 50 mg daily. She will track her blood pressure at home and we will adjust as needed. I would like for her to try to take his medication longer than one day to see if she truly is having side effects. Check potassium and renal function in one week. We discussed lifestyle modification as well.

## 2013-05-28 ENCOUNTER — Ambulatory Visit (HOSPITAL_COMMUNITY)
Admission: RE | Admit: 2013-05-28 | Discharge: 2013-05-28 | Disposition: A | Discharge status: Home / Self Care | Payer: Medicare Other | Source: Ambulatory Visit | Attending: Oncology | Admitting: Oncology

## 2013-05-28 ENCOUNTER — Encounter (HOSPITAL_COMMUNITY): Payer: Self-pay

## 2013-05-28 ENCOUNTER — Other Ambulatory Visit (HOSPITAL_BASED_OUTPATIENT_CLINIC_OR_DEPARTMENT_OTHER): Payer: Medicare Other

## 2013-05-28 DIAGNOSIS — C8303 Small cell B-cell lymphoma, intra-abdominal lymph nodes: Secondary | ICD-10-CM

## 2013-05-28 DIAGNOSIS — C8583 Other specified types of non-Hodgkin lymphoma, intra-abdominal lymph nodes: Secondary | ICD-10-CM

## 2013-05-28 DIAGNOSIS — D739 Disease of spleen, unspecified: Secondary | ICD-10-CM | POA: Insufficient documentation

## 2013-05-28 DIAGNOSIS — K449 Diaphragmatic hernia without obstruction or gangrene: Secondary | ICD-10-CM | POA: Insufficient documentation

## 2013-05-28 DIAGNOSIS — C8589 Other specified types of non-Hodgkin lymphoma, extranodal and solid organ sites: Secondary | ICD-10-CM | POA: Insufficient documentation

## 2013-05-28 DIAGNOSIS — R109 Unspecified abdominal pain: Secondary | ICD-10-CM | POA: Insufficient documentation

## 2013-05-28 DIAGNOSIS — R599 Enlarged lymph nodes, unspecified: Secondary | ICD-10-CM | POA: Insufficient documentation

## 2013-05-28 DIAGNOSIS — Z9089 Acquired absence of other organs: Secondary | ICD-10-CM | POA: Insufficient documentation

## 2013-05-28 DIAGNOSIS — K5900 Constipation, unspecified: Secondary | ICD-10-CM | POA: Insufficient documentation

## 2013-05-28 LAB — COMPREHENSIVE METABOLIC PANEL (CC13)
ALK PHOS: 83 U/L (ref 40–150)
ALT: 25 U/L (ref 0–55)
AST: 18 U/L (ref 5–34)
Albumin: 4 g/dL (ref 3.5–5.0)
Anion Gap: 11 mEq/L (ref 3–11)
BILIRUBIN TOTAL: 0.46 mg/dL (ref 0.20–1.20)
BUN: 10.9 mg/dL (ref 7.0–26.0)
CO2: 26 mEq/L (ref 22–29)
CREATININE: 0.8 mg/dL (ref 0.6–1.1)
Calcium: 9.7 mg/dL (ref 8.4–10.4)
Chloride: 106 mEq/L (ref 98–109)
GLUCOSE: 91 mg/dL (ref 70–140)
Potassium: 4 mEq/L (ref 3.5–5.1)
Sodium: 143 mEq/L (ref 136–145)
Total Protein: 6.9 g/dL (ref 6.4–8.3)

## 2013-05-28 LAB — CBC WITH DIFFERENTIAL/PLATELET
BASO%: 1.2 % (ref 0.0–2.0)
BASOS ABS: 0.1 10*3/uL (ref 0.0–0.1)
EOS ABS: 0.2 10*3/uL (ref 0.0–0.5)
EOS%: 3 % (ref 0.0–7.0)
HCT: 39.4 % (ref 34.8–46.6)
HEMOGLOBIN: 13 g/dL (ref 11.6–15.9)
LYMPH%: 38.7 % (ref 14.0–49.7)
MCH: 28.7 pg (ref 25.1–34.0)
MCHC: 32.9 g/dL (ref 31.5–36.0)
MCV: 87.3 fL (ref 79.5–101.0)
MONO#: 0.6 10*3/uL (ref 0.1–0.9)
MONO%: 8.2 % (ref 0.0–14.0)
NEUT%: 48.9 % (ref 38.4–76.8)
NEUTROS ABS: 3.8 10*3/uL (ref 1.5–6.5)
PLATELETS: 374 10*3/uL (ref 145–400)
RBC: 4.51 10*6/uL (ref 3.70–5.45)
RDW: 15.1 % — ABNORMAL HIGH (ref 11.2–14.5)
WBC: 7.8 10*3/uL (ref 3.9–10.3)
lymph#: 3 10*3/uL (ref 0.9–3.3)

## 2013-05-28 LAB — LACTATE DEHYDROGENASE (CC13): LDH: 215 U/L (ref 125–245)

## 2013-05-28 MED ORDER — IOHEXOL 300 MG/ML  SOLN
100.0000 mL | Freq: Once | INTRAMUSCULAR | Status: AC | PRN
  Administered 2013-05-28: 100 mL via INTRAVENOUS

## 2013-05-29 ENCOUNTER — Ambulatory Visit: Payer: Medicare Other | Admitting: Neurology

## 2013-05-30 LAB — IMMUNOFIXATION ELECTROPHORESIS
IGA: 51 mg/dL — AB (ref 69–380)
IGG (IMMUNOGLOBIN G), SERUM: 714 mg/dL (ref 690–1700)
IGM, SERUM: 37 mg/dL — AB (ref 52–322)
TOTAL PROTEIN, SERUM ELECTROPHOR: 6.7 g/dL (ref 6.0–8.3)

## 2013-05-30 LAB — SEDIMENTATION RATE: SED RATE: 1 mm/h (ref 0–22)

## 2013-06-01 ENCOUNTER — Telehealth: Payer: Self-pay | Admitting: *Deleted

## 2013-06-01 ENCOUNTER — Ambulatory Visit (HOSPITAL_BASED_OUTPATIENT_CLINIC_OR_DEPARTMENT_OTHER): Payer: Medicare Other

## 2013-06-01 ENCOUNTER — Telehealth: Payer: Self-pay | Admitting: Oncology

## 2013-06-01 ENCOUNTER — Ambulatory Visit (HOSPITAL_BASED_OUTPATIENT_CLINIC_OR_DEPARTMENT_OTHER): Payer: Medicare Other | Admitting: Oncology

## 2013-06-01 VITALS — BP 169/74 | HR 77 | Temp 98.0°F | Resp 18 | Ht 62.5 in | Wt 156.2 lb

## 2013-06-01 DIAGNOSIS — N3010 Interstitial cystitis (chronic) without hematuria: Secondary | ICD-10-CM

## 2013-06-01 DIAGNOSIS — I10 Essential (primary) hypertension: Secondary | ICD-10-CM

## 2013-06-01 DIAGNOSIS — K219 Gastro-esophageal reflux disease without esophagitis: Secondary | ICD-10-CM

## 2013-06-01 DIAGNOSIS — C8303 Small cell B-cell lymphoma, intra-abdominal lymph nodes: Secondary | ICD-10-CM

## 2013-06-01 DIAGNOSIS — C8583 Other specified types of non-Hodgkin lymphoma, intra-abdominal lymph nodes: Secondary | ICD-10-CM

## 2013-06-01 DIAGNOSIS — IMO0001 Reserved for inherently not codable concepts without codable children: Secondary | ICD-10-CM

## 2013-06-01 LAB — HEPATITIS PANEL, ACUTE
HCV Ab: NEGATIVE
HEP B S AG: NEGATIVE
Hep A IgM: NONREACTIVE
Hep B C IgM: NONREACTIVE

## 2013-06-01 LAB — URIC ACID (CC13): URIC ACID, SERUM: 4.1 mg/dL (ref 2.6–7.4)

## 2013-06-01 NOTE — Telephone Encounter (Signed)
Gave pt appt for lab and MD for February 2015, emailed michelle regarding chemo, sent pt to labs today

## 2013-06-01 NOTE — Telephone Encounter (Signed)
Per staff message and POF I have scheduled appts.  JMW  

## 2013-06-02 ENCOUNTER — Telehealth: Payer: Self-pay | Admitting: Oncology

## 2013-06-02 NOTE — Telephone Encounter (Signed)
Talked to pt and gave her appt for next week and she had question regarding Rituxan , advised pt to save it for the chemo nurse who can answer her questions

## 2013-06-03 NOTE — Progress Notes (Signed)
Hematology and Oncology Follow Up Visit  Amanda Kane [id number] 05-22-1940 73 y.o. 06/03/2013 11:52 AM   Principle Diagnosis: Encounter Diagnosis  Name Primary?  . 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 She has chronic diverticulosis with intermittent episodes of diverticulitis. A CT scan of the abdomen and pelvis done during one of these episodes back in July of 2013 demonstrated multiple, borderline sized, increased number, of lymph nodes. At time of recent evaluations here she had no palpable external adenopathy and no organomegaly. Her main complaint is chronic fatigue and polymyalgia. I did not think that the mesenteric adenopathy was related. She had an initial evaluation in this office by one of my partners who has since left the practice. She had an elective laparoscopic cholecystectomy on 10/05/2012. At that time a biopsy of one of the mesenteric lymph nodes was done and pathology was consistent with a low-grade, well-differentiated, a B-cell, lymphocytic lymphoma.  Her symptom complex has not changed over time. I have elected for close observation alone with interval CT scans. She had a scan on 05/28/2013 in anticipation of today's visit. This was compared to previous scans most recently 08/30/2012. Mesenteric adenopathy has persisted but there is no significant change. Some of the nodes are more prominent some less prominent, most are unchanged. None of the nodes are greater than 1.3 cm. I spent a long time reviewing the images with the patient and her husband to convince everybody that there were no major changes.  On her exam today which will be detailed below, there are new findings with axillary lymphadenopathy.        Medications: reviewed  Allergies:  Allergies  Allergen Reactions  . Amlodipine     headache  .  Atenolol     Headache   . Atorvastatin Other (See Comments)    "joints ache."  . Celecoxib Other (See Comments)    "burns my stomach."  . Ciprofloxacin Itching  . Ezetimibe     REACTION: Aches  . Hctz [Hydrochlorothiazide]     headache  . Lisinopril Cough  . Losartan     headache  . Macrobid [Nitrofurantoin Macrocrystal] Itching  . Metronidazole   . Pregabalin     REACTION: Numb  . Simvastatin     REACTION: Aches  . Sulfonamide Derivatives Itching  . Verapamil Other (See Comments)    Migraine headache    Review of Systems: Constitutional: Persistent fatigue  Hematology:  No bleeding or bruising ENT ROS: No sore throat Breast ROS:  Respiratory ROS: No cough or dyspnea Cardiovascular ROS:   No chest pain or palpitations Gastrointestinal ROS:   No abdominal pain no change in bowel habit Genito-Urinary ROS: No urinary tract symptoms Musculoskeletal ROS: Chronic musculoskeletal pain Neurological ROS: No headache or change in vision Dermatological ROS: No rash Remaining ROS negative.  Physical Exam: Blood pressure 169/74, pulse 77, temperature 98 F (36.7 C), temperature source Oral, resp. rate 18, height 5' 2.5" (1.588 m), weight 156 lb 3.2 oz (70.852 kg). Wt Readings from Last 3 Encounters:  06/01/13 156 lb 3.2 oz (70.852 kg)  05/11/13 156 lb (70.761 kg)  03/23/13 152 lb 12 oz (69.287 kg)     General appearance: Well nourished Caucasian woman HENNT: Pharynx no erythema, exudate, mass, or ulcer. No thyromegaly or thyroid nodules Lymph nodes: New axillary lymphadenopathy with lymph nodes up to about  3 cm Breasts:  Lungs: Clear to auscultation, resonant to percussion throughout Heart: Regular rhythm, no murmur, no gallop, no rub, no click, no edema Abdomen: Soft, nontender, normal bowel sounds, no mass, no organomegaly Extremities: No edema, no calf tenderness Musculoskeletal: no joint deformities GU:  Vascular: Carotid pulses 2+, no bruits,  Neurologic: Alert,  oriented, PERRLA,   cranial nerves grossly normal, motor strength 5 over 5, reflexes 1+ symmetric, upper body coordination normal, gait normal, moderate decrease in vibration sensation over the fingertips by tuning fork exam.  Skin: No rash or ecchymosis  Lab Results: CBC W/Diff    Component Value Date/Time   WBC 7.8 05/28/2013 1114   WBC 10.1 10/11/2012 0935   RBC 4.51 05/28/2013 1114   RBC 4.54 10/11/2012 0935   HGB 13.0 05/28/2013 1114   HGB 12.3 10/11/2012 0935   HCT 39.4 05/28/2013 1114   HCT 38.0 10/11/2012 0935   PLT 374 05/28/2013 1114   PLT 341.0 10/11/2012 0935   MCV 87.3 05/28/2013 1114   MCV 83.6 10/11/2012 0935   MCH 28.7 05/28/2013 1114   MCH 26.5 10/06/2012 0835   MCHC 32.9 05/28/2013 1114   MCHC 32.4 10/11/2012 0935   RDW 15.1* 05/28/2013 1114   RDW 17.3* 10/11/2012 0935   LYMPHSABS 3.0 05/28/2013 1114   LYMPHSABS 3.4 10/11/2012 0935   MONOABS 0.6 05/28/2013 1114   MONOABS 1.0 10/11/2012 0935   EOSABS 0.2 05/28/2013 1114   EOSABS 0.3 10/11/2012 0935   BASOSABS 0.1 05/28/2013 1114   BASOSABS 0.1 10/11/2012 0935     Chemistry      Component Value Date/Time   NA 143 05/28/2013 1114   NA 139 10/11/2012 0935   K 4.0 05/28/2013 1114   K 4.5 10/11/2012 0935   CL 104 10/11/2012 0935   CL 106 08/30/2012 0918   CO2 26 05/28/2013 1114   CO2 29 10/11/2012 0935   BUN 10.9 05/28/2013 1114   BUN 8 10/11/2012 0935   CREATININE 0.8 05/28/2013 1114   CREATININE 0.8 10/11/2012 0935      Component Value Date/Time   CALCIUM 9.7 05/28/2013 1114   CALCIUM 9.8 10/11/2012 0935   ALKPHOS 83 05/28/2013 1114   ALKPHOS 95 10/11/2012 0935   AST 18 05/28/2013 1114   AST 77* 10/11/2012 0935   ALT 25 05/28/2013 1114   ALT 85* 10/11/2012 0935   BILITOT 0.46 05/28/2013 1114   BILITOT 0.4 10/11/2012 0935       Radiological Studies: Ct Abdomen Pelvis W Contrast  05/28/2013   ADDENDUM REPORT: 05/28/2013 14:40  ADDENDUM: In the initial report for this examination, the presence of some subtle stranding adjacent to  diverticulae in the descending colon was commented on, however, this was not referenced in the conclusion of that report. These findings are concerning for evidence of potential early or mild acute diverticulitis. Clinical correlation is recommended.  These results were called by telephone at the time of interpretation on 05/28/2013 at 2:38 PM to Dr. Murriel Hopper , who verbally acknowledged these results.   Electronically Signed   By: Vinnie Langton M.D.   On: 05/28/2013 14:40   05/28/2013   CLINICAL DATA:  Abdominal adenopathy. History of B-cell lymphoma diagnosed in 2014. Abdominal pain. Constipation.  EXAM: CT ABDOMEN AND PELVIS WITH CONTRAST  TECHNIQUE: Multidetector CT imaging of the abdomen and pelvis was performed using the standard protocol following bolus administration of intravenous contrast.  CONTRAST:  191mL OMNIPAQUE IOHEXOL 300 MG/ML  SOLN  COMPARISON:  CT of  the abdomen and pelvis 08/30/2012.  FINDINGS: Lung Bases: Small hiatal hernia.  Otherwise, unremarkable.  Abdomen/Pelvis: Status post cholecystectomy. 6 mm low attenuation lesion in the spleen (image 18 of series 2) is unchanged compared to the prior study and is nonspecific. The spleen does not appear enlarged. The appearance of the liver, pancreas, bilateral adrenal glands and bilateral kidneys is unremarkable. No significant volume of ascites. No pneumoperitoneum. Numerous colonic diverticulae are noted, most pronounced in the descending colon and sigmoid colon. Today's study demonstrates a small amount of soft tissue stranding adjacent to the descending colon, which could suggest early or mild diverticulitis. No definite diverticular abscess identified at this time. No signs to suggest frank perforation. Innumerable borderline enlarged and mildly enlarged mesenteric lymph nodes are similar to the prior study, with several ileocolic lymph nodes measuring up to 11 mm and lymph node in the a left side of the small bowel mesentery  measuring up to 13 mm (images 46 and 43 of series 2 respectively). Numerous borderline enlarged retroperitoneal lymph nodes are also noted, measuring up to 9 mm in short axis, also similar to the prior examination. Uterus and ovaries are unremarkable in appearance. Urinary bladder is normal in appearance.  Musculoskeletal: There are no aggressive appearing lytic or blastic lesions noted in the visualized portions of the skeleton.  IMPRESSION: 1. Mesenteric and retroperitoneal lymphadenopathy appears very similar to the prior study, presumably related to residual disease in this patient with history of B-cell lymphoma. 2. Tiny subcentimeter ill-defined low-attenuation splenic lesion appears similar to the prior study. This is nonspecific. No splenomegaly. 3. Status post cholecystectomy. 4. Small hiatal hernia.  Electronically Signed: By: Vinnie Langton M.D. On: 05/28/2013 14:14    Impression:  #1. Low-grade, B-cell, non-Hodgkin's lymphoma She has now developed by axillary lymphadenopathy. Borderline mesenteric nodes are unchanged and no other additional findings on CT scan of the abdomen. We discussed that it might be appropriate at this time to initiate treatment. Although we have a number of options ranging from antibody therapy alone, chemotherapy plus antibody therapy, or the use of one of the new growth signal inhibitors, in view of her age, and minimal disease, I am recommending single agent monoclonal antibody therapy with Rituxan. I obtained a hepatitis acute profile today since Rituxan may reactivate hepatitis B. She tested negative for hepatitis A, B., and C. Plan: Rituxan standard dose 375 mg per meter squared IV infusion weekly x4 doses. There is new data just presented within the last year which suggests  that maintenance Rituxan doses do not prolong overall survival in the majority of patients. It appears to be just as reasonable to not give maintenance doses but just treat at time of  progression, therefore I will just give the for induction doses. Approximately one hour spent with direct face-to-face patient contact, including exam, coordination of care, review of x-rays, discussion of treatment plan, and entering a Rituxan care plan into the computer.  #2. Diverticulosis with intermittent diverticulitis #3. Fibromyalgia syndrome. #4. GERD with esophageal reflux and stricture  #5. Diverticulosis with episodes of diverticulitis  #6. Peptic ulcer disease  #7. Hypothyroid on replacement  #8. Interstitial cystitis  #9. Essential hypertension-benign  #10. Decrease in vibration sensation over the fingers:         Decreased  total IgA and IgM immunoglobulin, normal IgG IgA, no monoclonal proteins on         immunofixation electrophoresis. ( 05/28/2013)  I will transition her care to Dr. Alvy Bimler following her next  visit here.    CC: Patient Care Team: Abner Greenspan, MD as PCP - General (Family Medicine) Nobie Putnam, MD (Hematology and Oncology) Reece Packer, MD as Attending Physician (Urology) Gearlean Alf, MD as Attending Physician (Orthopedic Surgery) Lelon Perla, MD (Cardiology) Inda Castle, MD (Gastroenterology)   Annia Belt, MD 1/18/201511:52 AM

## 2013-06-04 ENCOUNTER — Other Ambulatory Visit: Payer: Medicare Other

## 2013-06-06 ENCOUNTER — Telehealth: Payer: Self-pay | Admitting: *Deleted

## 2013-06-06 NOTE — Telephone Encounter (Signed)
Received vm call from pt asking about hair coloring/dyes with rituxan.  Returned call & informed pt that should be OK to use hair coloring/dyes b/c rituxan does not cause hair loss. She also wants to know the stage of her disease.  Informed that I would defer this to Dr Beryle Beams.  Note to Dr.

## 2013-06-07 ENCOUNTER — Telehealth: Payer: Self-pay | Admitting: *Deleted

## 2013-06-07 NOTE — Telephone Encounter (Signed)
Spoke with patient and let her know in answer to her question that she is clinically in Stage 2.  She did not have a bone marrow biopsy.  Dr. Beryle Beams said that stage is not very important in low grade lymphoma in patient who is not anemic.  She appreciated the call back.

## 2013-06-08 ENCOUNTER — Ambulatory Visit (HOSPITAL_BASED_OUTPATIENT_CLINIC_OR_DEPARTMENT_OTHER): Payer: Medicare Other

## 2013-06-08 VITALS — BP 104/77 | HR 96 | Temp 98.5°F | Resp 18

## 2013-06-08 DIAGNOSIS — C8303 Small cell B-cell lymphoma, intra-abdominal lymph nodes: Secondary | ICD-10-CM

## 2013-06-08 DIAGNOSIS — C8583 Other specified types of non-Hodgkin lymphoma, intra-abdominal lymph nodes: Secondary | ICD-10-CM

## 2013-06-08 DIAGNOSIS — Z5112 Encounter for antineoplastic immunotherapy: Secondary | ICD-10-CM

## 2013-06-08 MED ORDER — ACETAMINOPHEN 325 MG PO TABS
650.0000 mg | ORAL_TABLET | Freq: Once | ORAL | Status: AC
  Administered 2013-06-08: 650 mg via ORAL

## 2013-06-08 MED ORDER — ACETAMINOPHEN 325 MG PO TABS
ORAL_TABLET | ORAL | Status: AC
  Filled 2013-06-08: qty 2

## 2013-06-08 MED ORDER — DIPHENHYDRAMINE HCL 25 MG PO CAPS
ORAL_CAPSULE | ORAL | Status: AC
  Filled 2013-06-08: qty 2

## 2013-06-08 MED ORDER — SODIUM CHLORIDE 0.9 % IV SOLN
Freq: Once | INTRAVENOUS | Status: AC
  Administered 2013-06-08: 09:00:00 via INTRAVENOUS

## 2013-06-08 MED ORDER — DIPHENHYDRAMINE HCL 25 MG PO CAPS
50.0000 mg | ORAL_CAPSULE | Freq: Once | ORAL | Status: AC
  Administered 2013-06-08: 50 mg via ORAL

## 2013-06-08 MED ORDER — RITUXIMAB CHEMO INJECTION 10 MG/ML
375.0000 mg/m2 | Freq: Once | INTRAVENOUS | Status: AC
  Administered 2013-06-08: 700 mg via INTRAVENOUS
  Filled 2013-06-08: qty 70

## 2013-06-08 MED ORDER — DIPHENHYDRAMINE HCL 25 MG PO CAPS
ORAL_CAPSULE | ORAL | Status: AC
  Filled 2013-06-08: qty 1

## 2013-06-08 NOTE — Patient Instructions (Signed)
[facility] Discharge Instructions for Patients Receiving Chemotherapy  Today you received the following chemotherapy agents Rituxan To help prevent nausea and vomiting after your treatment, we encourage you to take your nausea medication as prescribed.   If you develop nausea and vomiting that is not controlled by your nausea medication, call the clinic.   BELOW ARE SYMPTOMS THAT SHOULD BE REPORTED IMMEDIATELY:  *FEVER GREATER THAN 100.5 F  *CHILLS WITH OR WITHOUT FEVER  NAUSEA AND VOMITING THAT IS NOT CONTROLLED WITH YOUR NAUSEA MEDICATION  *UNUSUAL SHORTNESS OF BREATH  *UNUSUAL BRUISING OR BLEEDING  TENDERNESS IN MOUTH AND THROAT WITH OR WITHOUT PRESENCE OF ULCERS  *URINARY PROBLEMS  *BOWEL PROBLEMS  UNUSUAL RASH Items with * indicate a potential emergency and should be followed up as soon as possible.  Feel free to call the clinic you have any questions or concerns. The clinic phone number is [phone].   Rituximab injection (Rituxan) What is this medicine? RITUXIMAB (ri TUX i mab) is a monoclonal antibody. This medicine changes the way the body's immune system works. It is used commonly to treat non-Hodgkin's lymphoma and other conditions. In cancer cells, this drug targets a specific protein within cancer cells and stops the cancer cells from growing. It is also used to treat rhuematoid arthritis (RA). In RA, this medicine slow the inflammatory process and help reduce joint pain and swelling. This medicine is often used with other cancer or arthritis medications. This medicine may be used for other purposes; ask your health care provider or pharmacist if you have questions. COMMON BRAND NAME(S): Rituxan What should I tell my health care provider before I take this medicine? They need to know if you have any of these conditions: -blood disorders -heart disease -history of hepatitis B -infection (especially a virus infection such as chickenpox, cold  sores, or herpes) -irregular heartbeat -kidney disease -lung or breathing disease, like asthma -lupus -an unusual or allergic reaction to rituximab, mouse proteins, other medicines, foods, dyes, or preservatives -pregnant or trying to get pregnant -breast-feeding How should I use this medicine? This medicine is for infusion into a vein. It is administered in a hospital or clinic by a specially trained health care professional. A special MedGuide will be given to you by the pharmacist with each prescription and refill. Be sure to read this information carefully each time. Talk to your pediatrician regarding the use of this medicine in children. This medicine is not approved for use in children. Overdosage: If you think you have taken too much of this medicine contact a poison control center or emergency room at once. NOTE: This medicine is only for you. Do not share this medicine with others. What if I miss a dose? It is important not to miss a dose. Call your doctor or health care professional if you are unable to keep an appointment. What may interact with this medicine? -cisplatin -medicines for blood pressure -some other medicines for arthritis -vaccines This list may not describe all possible interactions. Give your health care provider a list of all the medicines, herbs, non-prescription drugs, or dietary supplements you use. Also tell them if you smoke, drink alcohol, or use illegal drugs. Some items may interact with your medicine. What should I watch for while using this medicine? Report any side effects that you notice during your treatment right away, such as changes in your breathing, fever, chills, dizziness or lightheadedness. These effects are more common with the first dose. Visit your prescriber or   health care professional for checks on your progress. You will need to have regular blood work. Report any other side effects. The side effects of this medicine can continue after  you finish your treatment. Continue your course of treatment even though you feel ill unless your doctor tells you to stop. Call your doctor or health care professional for advice if you get a fever, chills or sore throat, or other symptoms of a cold or flu. Do not treat yourself. This drug decreases your body's ability to fight infections. Try to avoid being around people who are sick. This medicine may increase your risk to bruise or bleed. Call your doctor or health care professional if you notice any unusual bleeding. Be careful brushing and flossing your teeth or using a toothpick because you may get an infection or bleed more easily. If you have any dental work done, tell your dentist you are receiving this medicine. Avoid taking products that contain aspirin, acetaminophen, ibuprofen, naproxen, or ketoprofen unless instructed by your doctor. These medicines may hide a fever. Do not become pregnant while taking this medicine. Women should inform their doctor if they wish to become pregnant or think they might be pregnant. There is a potential for serious side effects to an unborn child. Talk to your health care professional or pharmacist for more information. Do not breast-feed an infant while taking this medicine. What side effects may I notice from receiving this medicine? Side effects that you should report to your doctor or health care professional as soon as possible: -allergic reactions like skin rash, itching or hives, swelling of the face, lips, or tongue -low blood counts - this medicine may decrease the number of white blood cells, red blood cells and platelets. You may be at increased risk for infections and bleeding. -signs of infection - fever or chills, cough, sore throat, pain or difficulty passing urine -signs of decreased platelets or bleeding - bruising, pinpoint red spots on the skin, black, tarry stools, blood in the urine -signs of decreased red blood cells - unusually weak or  tired, fainting spells, lightheadedness -breathing problems -confused, not responsive -chest pain -fast, irregular heartbeat -feeling faint or lightheaded, falls -mouth sores -redness, blistering, peeling or loosening of the skin, including inside the mouth -stomach pain -swelling of the ankles, feet, or hands -trouble passing urine or change in the amount of urine Side effects that usually do not require medical attention (report to your doctor or other health care professional if they continue or are bothersome): -anxiety -headache -loss of appetite -muscle aches -nausea -night sweats This list may not describe all possible side effects. Call your doctor for medical advice about side effects. You may report side effects to FDA at 1-800-FDA-1088. Where should I keep my medicine? This drug is given in a hospital or clinic and will not be stored at home. NOTE: This sheet is a summary. It may not cover all possible information. If you have questions about this medicine, talk to your doctor, pharmacist, or health care provider.  2014, [employer] Standard. (2008-01-01 14:04:59)  

## 2013-06-11 ENCOUNTER — Telehealth: Payer: Self-pay

## 2013-06-11 NOTE — Telephone Encounter (Signed)
Message copied by Baruch Merl on Mon Jun 11, 2013  1:37 PM ------      Message from: Perlie Gold      Created: Fri Jun 08, 2013  1:00 PM      Regarding: Chemo Follow up call      Contact: [phone]       First time Rituxan. Dr. Beryle Beams patient. Please call.            Thanks,      Barnetta Chapel, RN ------

## 2013-06-11 NOTE — Telephone Encounter (Addendum)
Spoke with Ms. Amanda Kane.  She has not had any problems with nausea, vomiting, diarrhea. She is eating well. She cc of burning in bladder and urethera. No foul odor, urgency , or frequency noted.  Onset yesterday.  She is taking Elmiron 100 mg daily and pyridium 100 mg daily.  Told her that this was ordered tid prn.  Pt. Only has 10 tablets prescribed and used refill. She has had a H/A.  She is using tylenol with some decrease. If she needs to give a urine specimen, cann she go to PCP at [facility]- Dr. Glori Bickers?  This nurse called pt back after message left that she was having some urinary problems after rituxan & states that her interstitial cystitis is bad now.  Informed pt that she should call her PCP or urologist to discuss b/c rituxan shouldn't cause this.  She also had some questions about billing/insurance & was directed to financial counselor & message was left for Lenise White to call her back.   [name] RN

## 2013-06-14 ENCOUNTER — Encounter: Payer: Self-pay | Admitting: Internal Medicine

## 2013-06-14 ENCOUNTER — Ambulatory Visit (INDEPENDENT_AMBULATORY_CARE_PROVIDER_SITE_OTHER): Payer: Medicare Other | Admitting: Internal Medicine

## 2013-06-14 VITALS — BP 102/70 | HR 86 | Temp 98.4°F | Wt 153.8 lb

## 2013-06-14 DIAGNOSIS — N318 Other neuromuscular dysfunction of bladder: Secondary | ICD-10-CM

## 2013-06-14 DIAGNOSIS — N3281 Overactive bladder: Secondary | ICD-10-CM

## 2013-06-14 DIAGNOSIS — R300 Dysuria: Secondary | ICD-10-CM

## 2013-06-14 DIAGNOSIS — N3090 Cystitis, unspecified without hematuria: Secondary | ICD-10-CM

## 2013-06-14 LAB — POCT URINALYSIS DIPSTICK
Blood, UA: NEGATIVE
GLUCOSE UA: NEGATIVE
KETONES UA: NEGATIVE
LEUKOCYTES UA: NEGATIVE
Nitrite, UA: NEGATIVE
PROTEIN UA: NEGATIVE
Spec Grav, UA: <=1.005
Urobilinogen, UA: 0.2
pH, UA: 7.5

## 2013-06-14 MED ORDER — OXYBUTYNIN CHLORIDE ER 5 MG PO TB24: 5.0000 mg | ORAL_TABLET | Freq: Every day | ORAL | Status: DC

## 2013-06-14 NOTE — Addendum Note (Signed)
Addended by: Lurlean Nanny on: 06/14/2013 12:57 PM   Modules accepted: Orders

## 2013-06-14 NOTE — Patient Instructions (Signed)

## 2013-06-14 NOTE — Progress Notes (Signed)
HPI  Pt presents to the clinic today with c/o dysuria. This started 1 week ago. She is c/o a burning and feeling of pressure in the vagina. She also c/o right flank pain. She has tried pyridium and elmiron. These do not seem to be helping. She does have a history of cystitis. She did start chemo 1 week ago for lymphoma   Review of Systems  Past Medical History  Diagnosis Date  . Arthritis   . Esophageal reflux   . Myalgia and myositis, unspecified   . Pure hypercholesterolemia   . Unspecified hypothyroidism   . Colonic polyp     last one around 2008.  Due one now.   . Interstitial cystitis   . Hypertension   . PUD (peptic ulcer disease)   . MVP (mitral valve prolapse)   . Adenopathy 02/25/2012  . Diverticulosis     past diverticulitis; last 2 years ago. no resection.   . Fibromyalgia   . Lymphadenopathy, abdominal 09/01/2012  . Allergy   . Blood transfusion without reported diagnosis   . Cancer     malginant polyps colono 20 years ago  . Eczema     Hx: of  . H/O hiatal hernia   . Headache(784.0)     Hx: of   . 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 Coronary artery disease Brother     MI at age 92  . Heart failure Mother   . Stroke Father   . Stomach cancer Paternal Grandmother   . Rectal cancer Neg Hx   . Esophageal cancer Neg Hx   . Colon cancer Neg Hx     History   Social History  . Marital Status: Married    Spouse Name: N/A    Number of Children: 2  . Years of Education: N/A   Occupational History  . Retired     retired Pharmacist, hospital   Social History Main Topics  . Smoking status: Never Smoker   . Smokeless tobacco: Never Used  . Alcohol Use: Yes     Comment: Glass wine per night  . Drug Use: No  . Sexual Activity: Yes   Other Topics Concern  . Not on file   Social History Narrative  . No narrative on file    Allergies  Allergen Reactions  . Amlodipine    headache  . Atenolol     Headache   . Atorvastatin Other (See Comments)    "joints ache."  . Celecoxib Other (See Comments)    "burns my stomach."  . Ciprofloxacin Itching  . Ezetimibe     REACTION: Aches  . Hctz [Hydrochlorothiazide]     headache  . Lisinopril Cough  . Losartan     headache  . Macrobid [Nitrofurantoin Macrocrystal] Itching  . Metronidazole   . Pregabalin     REACTION: Numb  . Simvastatin     REACTION: Aches  . Sulfonamide Derivatives Itching  . Verapamil Other (See Comments)    Migraine headache    Constitutional: Pt reports fatigue. Denies fever, malaise headache or abrupt weight changes.   GU: Pt reports urgency, frequency and pain with urination. She also c/o intermittent incontinence. Denies burning sensation, blood in urine, odor or discharge. Skin: Denies redness, rashes, lesions or ulcercations.   No other specific complaints in a complete review of systems (except as listed in HPI above).    Objective:   Physical  Exam  BP 102/70  Pulse 86  Temp(Src) 98.4 F (36.9 C) (Oral)  Wt 153 lb 12 oz (69.741 kg)  SpO2 98% Wt Readings from Last 3 Encounters:  06/14/13 153 lb 12 oz (69.741 kg)  06/01/13 156 lb 3.2 oz (70.852 kg)  05/11/13 156 lb (70.761 kg)    General: Appears her stated age, well developed, well nourished in NAD. Cardiovascular: Normal rate and rhythm. S1,S2 noted.  No murmur, rubs or gallops noted. No JVD or BLE edema. No carotid bruits noted. Pulmonary/Chest: Normal effort and positive vesicular breath sounds. No respiratory distress. No wheezes, rales or ronchi noted.  Abdomen: Soft and nontender. Normal bowel sounds, no bruits noted. No distention or masses noted. Liver, spleen and kidneys non palpable. Tender to palpation over the bladder area. No CVA tenderness. Pelvic exam: Defferred     Assessment & Plan:   Dysuria, abnormal vaginal sensation, incontinence:  She declines vaginal exam today- thinks her symptoms are  coming from chemo Urinalysis- mod bilirubin otherwise negative The elmiron and pyridium are not effective She is interested in treatment for OAB to see if this will help eRx for Oxytrol- advised her to check with her oncologist to make sure she is ok to start this medication while on chemo  RTC as needed or if symptoms persist.

## 2013-06-14 NOTE — Progress Notes (Signed)
Pre-visit discussion using our clinic review tool. No additional management support is needed unless otherwise documented below in the visit note.  

## 2013-06-15 ENCOUNTER — Ambulatory Visit (HOSPITAL_BASED_OUTPATIENT_CLINIC_OR_DEPARTMENT_OTHER): Payer: Medicare Other

## 2013-06-15 ENCOUNTER — Other Ambulatory Visit: Payer: Self-pay | Admitting: Oncology

## 2013-06-15 VITALS — BP 152/69 | HR 70 | Temp 97.9°F | Resp 16

## 2013-06-15 DIAGNOSIS — Z5112 Encounter for antineoplastic immunotherapy: Secondary | ICD-10-CM

## 2013-06-15 DIAGNOSIS — C8303 Small cell B-cell lymphoma, intra-abdominal lymph nodes: Secondary | ICD-10-CM

## 2013-06-15 DIAGNOSIS — C8583 Other specified types of non-Hodgkin lymphoma, intra-abdominal lymph nodes: Secondary | ICD-10-CM

## 2013-06-15 MED ORDER — DIPHENHYDRAMINE HCL 25 MG PO CAPS
ORAL_CAPSULE | ORAL | Status: AC
  Filled 2013-06-15: qty 2

## 2013-06-15 MED ORDER — ACETAMINOPHEN 325 MG PO TABS
650.0000 mg | ORAL_TABLET | Freq: Once | ORAL | Status: AC
  Administered 2013-06-15: 650 mg via ORAL

## 2013-06-15 MED ORDER — SODIUM CHLORIDE 0.9 % IV SOLN
Freq: Once | INTRAVENOUS | Status: AC
  Administered 2013-06-15: 09:00:00 via INTRAVENOUS

## 2013-06-15 MED ORDER — DIPHENHYDRAMINE HCL 25 MG PO CAPS
50.0000 mg | ORAL_CAPSULE | Freq: Once | ORAL | Status: AC
  Administered 2013-06-15: 50 mg via ORAL

## 2013-06-15 MED ORDER — ACETAMINOPHEN 325 MG PO TABS
ORAL_TABLET | ORAL | Status: AC
  Filled 2013-06-15: qty 2

## 2013-06-15 MED ORDER — SODIUM CHLORIDE 0.9 % IV SOLN
375.0000 mg/m2 | Freq: Once | INTRAVENOUS | Status: AC
  Administered 2013-06-15: 700 mg via INTRAVENOUS
  Filled 2013-06-15: qty 70

## 2013-06-15 NOTE — Patient Instructions (Signed)
[facility] Discharge Instructions for Patients Receiving Chemotherapy  Today you received the following chemotherapy agents Rituxan.  To help prevent nausea and vomiting after your treatment, we encourage you to take your nausea medication as prescribed.   If you develop nausea and vomiting that is not controlled by your nausea medication, call the clinic.   BELOW ARE SYMPTOMS THAT SHOULD BE REPORTED IMMEDIATELY:  *FEVER GREATER THAN 100.5 F  *CHILLS WITH OR WITHOUT FEVER  NAUSEA AND VOMITING THAT IS NOT CONTROLLED WITH YOUR NAUSEA MEDICATION  *UNUSUAL SHORTNESS OF BREATH  *UNUSUAL BRUISING OR BLEEDING  TENDERNESS IN MOUTH AND THROAT WITH OR WITHOUT PRESENCE OF ULCERS  *URINARY PROBLEMS  *BOWEL PROBLEMS  UNUSUAL RASH Items with * indicate a potential emergency and should be followed up as soon as possible.  Feel free to call the clinic you have any questions or concerns. The clinic phone number is [phone].    

## 2013-06-18 ENCOUNTER — Ambulatory Visit (INDEPENDENT_AMBULATORY_CARE_PROVIDER_SITE_OTHER): Payer: Medicare Other | Admitting: Neurology

## 2013-06-18 ENCOUNTER — Other Ambulatory Visit: Payer: Self-pay | Admitting: Neurology

## 2013-06-18 ENCOUNTER — Encounter: Payer: Self-pay | Admitting: Neurology

## 2013-06-18 VITALS — BP 148/90 | HR 84 | Ht 62.5 in | Wt 159.7 lb

## 2013-06-18 DIAGNOSIS — M2550 Pain in unspecified joint: Secondary | ICD-10-CM

## 2013-06-18 DIAGNOSIS — R6889 Other general symptoms and signs: Secondary | ICD-10-CM

## 2013-06-18 DIAGNOSIS — G2581 Restless legs syndrome: Secondary | ICD-10-CM

## 2013-06-18 MED ORDER — TOPIRAMATE 50 MG PO TABS: ORAL_TABLET | ORAL | Status: DC

## 2013-06-18 NOTE — Progress Notes (Signed)
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  Records and images were personally reviewed where available.    UPDATE: After 4 weeks, she stopped the topiramate because she didn't notice any improvement.  She also got off of several pain medications at that time too.  She continues to have a dull constant headache, now right-sided, non-throbbing and not associated with nausea.  She takes tylenol about 2 or 3 days out of the week.  She will take a tramadol less frequently than that.  She has significant stress and anxiety and has difficulty sleeping.  She takes prozac every other night for fibromyalgia.  She has severe pain in the legs.  She has also started rituximab therapy for her lymphoma.  She notes that her legs jerk in bed.  She has discomfort in the legs and needs to get up and walk in off to relieve the discomfort.  This affects her sleep, as well.  HISTORY: Onset:  One year ago (around the time she was diagnosed with Non-Hodgkin's lymphoma Location:  Bifrontal, retro-orbital, neck pain, fullness in ears Quality:  Constant dull nonthrobbing with occasional stabbing pain Intensity:  Usually 5/10 but can fluctuate to 9-10/10 Aura: no Associated symptoms:  Nausea and vomiting (has longstanding history of nausea but possibly worse with headache), photophobia, phonophobia. Duration:  Constant but fluctuates Frequency:  daily Triggers/exacerbating factors:  none Relieving factors:  none  Past abortive therapy:  tramadol, oxycodone.  Unable to take NSAIDs due to PUD.  Tylenol (upsets stomach and ineffective), Imitrex (made her sick but would not use triptans due to history of cerebrovascular disease).   Unable to take NSAIDs due to PUD.  Unable to take Excedrin (makes sick).   Past preventative therapy:  Gabapentin.  Tried amitripytline in past for something else and did not tolerate.  Current abortive therapy:  Switches between Tylenol and Tramadol (takes once daily).  Tried Imitrex recently but made her sick.  Used to take several medications daily. Current preventative therapy:  Topamax 50mg  qd  Caffeine:  1 cup coffee per day Sleep hygiene:  Does not sleep well.   Goes to bed at 2am and gets up at 8am.  But doesn't really fall asleep.  Also has periodic limb movements. Stress/depression:  Not now. 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  There is also chronic small vessel ischemic changes.  No acute pathology or abnormal enhancement. Due to these results, she had a stroke workup.  2D Echo revealed LVEF 60-65% with mild focal basal septal hypertrophy.  Carotid doppler revealed 1-39% bilateral ICA stenosis.  05/01/13 EKG:  NSR, QT/QTc 394/418.  PAST MEDICAL HISTORY: Past Medical History  Diagnosis Date  . Arthritis   . Esophageal reflux   . Myalgia and myositis, unspecified   . Pure hypercholesterolemia   . Unspecified hypothyroidism   . Colonic polyp     last one around 2008.  Due one now.   . Interstitial cystitis   . Hypertension   . PUD (peptic ulcer disease)   . MVP (mitral valve prolapse)   . Adenopathy  02/25/2012  . Diverticulosis     past diverticulitis; last 2 years ago. no resection.   . Fibromyalgia   . Lymphadenopathy, abdominal 09/01/2012  . Allergy   . Blood transfusion without reported diagnosis   . Cancer     malginant polyps colono 20 years ago  . Eczema     Hx: of  . H/O hiatal hernia   . Headache(784.0)     Hx: of   . Small cell B-cell lymphoma of intra-abdominal  lymph nodes 10/12/2012    MEDICATIONS: Current Outpatient Prescriptions on File Prior to Visit  Medication Sig Dispense Refill  . aspirin 81 MG tablet Take 81 mg by mouth daily.      Marland Kitchen FAMOTIDINE PO Take 1 tablet by mouth daily as needed (acid reflux).      Marland Kitchen FLUoxetine (PROZAC) 20 MG capsule Take 1 capsule by mouth every other day.       . gabapentin (NEURONTIN) 100 MG capsule Take 100 mg by mouth as needed.      Marland Kitchen losartan (COZAAR) 50 MG tablet Take 1 tablet (50 mg total) by mouth daily.  90 tablet  3  . Multiple Vitamins-Minerals (MULTIVITAMIN PO) Take 1 tablet by mouth daily.      . pentosan polysulfate (ELMIRON) 100 MG capsule Take 100 mg by mouth as needed.      . phenazopyridine (PYRIDIUM) 100 MG tablet Take 1 tablet (100 mg total) by mouth 3 (three) times daily as needed (urinary pain).  10 tablet  1  . SYNTHROID 75 MCG tablet TAKE 1 TABLET (75 MCG TOTAL) BY MOUTH DAILY.  90 tablet  0  . traMADol (ULTRAM) 50 MG tablet Take 1 tablet by mouth as needed.       No current facility-administered medications on file prior to visit.    ALLERGIES: Allergies  Allergen Reactions  . Amlodipine     headache  . Atenolol     Headache   . Atorvastatin Other (See Comments)    "joints ache."  . Celecoxib Other (See Comments)    "burns my stomach."  . Ciprofloxacin Itching  . Ezetimibe     REACTION: Aches  . Hctz [Hydrochlorothiazide]     headache  . Lisinopril Cough  . Losartan     headache  . Macrobid [Nitrofurantoin Macrocrystal] Itching  . Metronidazole   . Pregabalin     REACTION: Numb  . Simvastatin     REACTION: Aches  . Sulfonamide Derivatives Itching  . Verapamil Other (See Comments)    Migraine headache    FAMILY HISTORY: Family History  Problem Relation Age of Onset  . Coronary artery disease Brother     MI at age 25  . Heart failure Mother   . Stroke Father   . Stomach cancer Paternal Grandmother   . Rectal cancer Neg Hx   . Esophageal cancer Neg Hx   .  Colon cancer Neg Hx     SOCIAL HISTORY: History   Social History  . Marital Status: Married    Spouse Name: N/A    Number of Children: 2  . Years of Education: N/A   Occupational History  . Retired     retired Pharmacist, hospital   Social History Main Topics  . Smoking status: Never Smoker   . Smokeless tobacco: Never Used  . Alcohol Use: Yes     Comment: Glass wine per night  . Drug Use: No  . Sexual Activity: Yes   Other Topics Concern  .  Not on file   Social History Narrative  . No narrative on file    REVIEW OF SYSTEMS: Constitutional: No fevers, chills, or sweats, no generalized fatigue, change in appetite Eyes: No visual changes, double vision, eye pain Ear, nose and throat: No hearing loss, ear pain, nasal congestion, sore throat Cardiovascular: No chest pain, palpitations Respiratory:  No shortness of breath at rest or with exertion, wheezes GastrointestinaI: No nausea, vomiting, diarrhea, abdominal pain, fecal incontinence Genitourinary:  No dysuria, urinary retention or frequency Musculoskeletal: Leg pain. No neck pain, back pain Integumentary: No rash, pruritus, skin lesions Neurological: as above Psychiatric:anxiety, stress, insomnia Endocrine: No palpitations, fatigue, diaphoresis, mood swings, change in appetite, change in weight, increased thirst Hematologic/Lymphatic:  No anemia, purpura, petechiae. Allergic/Immunologic: no itchy/runny eyes, nasal congestion, recent allergic reactions, rashes  PHYSICAL EXAM: Filed Vitals:   06/18/13 1255  BP: 148/90  Pulse: 84   General: No acute distress Head:  Normocephalic/atraumatic Neck: supple, no paraspinal tenderness, full range of motion Heart:  Regular rate and rhythm Lungs:  Clear to auscultation bilaterally Back: No paraspinal tenderness Neurological Exam: alert and oriented to person, place, and time. Speech fluent and not dysarthric, language intact.  CN II-XII intact.  Bulk and tone normal, muscle strength  5/5 throughout.  Sensation to temperature reduced in feet.  Vibration intact.  Deep tendon reflexes 2+ throughout, toes downgoing.  Finger to nose intact.  Gait normal, able to walk in tandem, Romberg negative.  IMPRESSION: 1.  Chronic daily headaches, likely tension-type. 2.  Restless leg.  May be idiopathic or manifestation of fibromyalgia  PLAN: 1.  Stressed importance of giving medications a chance.  We will restart topamax and titrate to 100mg  at bedtime.  She will call after taking this dose for 3 weeks with update.  Other options include nortriptyline. 2.  Check B12, TSH and ferritin.  May consider starting a dopamine agonist but do not want to start two medications at same time. 3.  Discuss with Dr. Glori Bickers about increasing prozac to daily.  Stress, anxiety, and insomnia contributes to daily headaches. 4.  Follow up in 3 months.  Metta Clines, DO  CC:  Loura Pardon, MD

## 2013-06-18 NOTE — Patient Instructions (Signed)
1. We will restart the topamax.  I will prescribe you 50mg  tablets.  Take 1 tablet at bedtime for 7 days, then 1.5 tablets at bedtime for 7 days, then 2 tablets at bedtime.  Call three weeks after you have been on 2 tablets at bedtime and we can make adjustments as needed. 2.  Discuss with Dr. Glori Bickers about taking the Prozac daily to address stress and anxiety, which contributes to daily headache. 3.  We will check B12, TSH and ferritin levels to look for causes of restless leg. 4.  Follow up in 3 months.

## 2013-06-19 LAB — FERRITIN: FERRITIN: 10 ng/mL (ref 10–291)

## 2013-06-19 LAB — TSH: TSH: 1.273 u[IU]/mL (ref 0.350–4.500)

## 2013-06-19 LAB — VITAMIN B12: VITAMIN B 12: 516 pg/mL (ref 211–911)

## 2013-06-20 ENCOUNTER — Telehealth: Payer: Self-pay | Admitting: *Deleted

## 2013-06-20 LAB — METHYLMALONIC ACID, SERUM: Methylmalonic Acid, Quant: 0.09 umol/L (ref <0.40)

## 2013-06-20 NOTE — Telephone Encounter (Signed)
Patient is aware that lab work is normal.

## 2013-06-20 NOTE — Telephone Encounter (Signed)
No answer did not leave voicemail  

## 2013-06-22 ENCOUNTER — Ambulatory Visit (HOSPITAL_BASED_OUTPATIENT_CLINIC_OR_DEPARTMENT_OTHER): Payer: Medicare Other

## 2013-06-22 VITALS — BP 158/64 | HR 68 | Temp 98.0°F | Resp 16

## 2013-06-22 DIAGNOSIS — C8583 Other specified types of non-Hodgkin lymphoma, intra-abdominal lymph nodes: Secondary | ICD-10-CM

## 2013-06-22 DIAGNOSIS — Z5112 Encounter for antineoplastic immunotherapy: Secondary | ICD-10-CM

## 2013-06-22 DIAGNOSIS — C8303 Small cell B-cell lymphoma, intra-abdominal lymph nodes: Secondary | ICD-10-CM

## 2013-06-22 MED ORDER — DIPHENHYDRAMINE HCL 25 MG PO CAPS
ORAL_CAPSULE | ORAL | Status: AC
  Filled 2013-06-22: qty 2

## 2013-06-22 MED ORDER — ACETAMINOPHEN 325 MG PO TABS
650.0000 mg | ORAL_TABLET | Freq: Once | ORAL | Status: AC
  Administered 2013-06-22: 650 mg via ORAL

## 2013-06-22 MED ORDER — SODIUM CHLORIDE 0.9 % IV SOLN
375.0000 mg/m2 | Freq: Once | INTRAVENOUS | Status: AC
  Administered 2013-06-22: 700 mg via INTRAVENOUS
  Filled 2013-06-22: qty 70

## 2013-06-22 MED ORDER — ACETAMINOPHEN 325 MG PO TABS
ORAL_TABLET | ORAL | Status: AC
  Filled 2013-06-22: qty 2

## 2013-06-22 MED ORDER — DIPHENHYDRAMINE HCL 25 MG PO CAPS
50.0000 mg | ORAL_CAPSULE | Freq: Once | ORAL | Status: AC
  Administered 2013-06-22: 50 mg via ORAL

## 2013-06-22 MED ORDER — SODIUM CHLORIDE 0.9 % IV SOLN
Freq: Once | INTRAVENOUS | Status: AC
  Administered 2013-06-22: 09:00:00 via INTRAVENOUS

## 2013-06-22 NOTE — Patient Instructions (Signed)
[facility] Discharge Instructions for Patients Receiving Chemotherapy  Today you received the following chemotherapy agents; Rituxan.     BELOW ARE SYMPTOMS THAT SHOULD BE REPORTED IMMEDIATELY:  *FEVER GREATER THAN 100.5 F  *CHILLS WITH OR WITHOUT FEVER  NAUSEA AND VOMITING THAT IS NOT CONTROLLED WITH YOUR NAUSEA MEDICATION  *UNUSUAL SHORTNESS OF BREATH  *UNUSUAL BRUISING OR BLEEDING  TENDERNESS IN MOUTH AND THROAT WITH OR WITHOUT PRESENCE OF ULCERS  *URINARY PROBLEMS  *BOWEL PROBLEMS  UNUSUAL RASH Items with * indicate a potential emergency and should be followed up as soon as possible.  Feel free to call the clinic you have any questions or concerns. The clinic phone number is [phone].    

## 2013-06-29 ENCOUNTER — Ambulatory Visit (HOSPITAL_BASED_OUTPATIENT_CLINIC_OR_DEPARTMENT_OTHER): Payer: Medicare Other

## 2013-06-29 VITALS — BP 150/82 | HR 85 | Temp 98.0°F | Resp 18

## 2013-06-29 DIAGNOSIS — Z5112 Encounter for antineoplastic immunotherapy: Secondary | ICD-10-CM

## 2013-06-29 DIAGNOSIS — C8303 Small cell B-cell lymphoma, intra-abdominal lymph nodes: Secondary | ICD-10-CM

## 2013-06-29 DIAGNOSIS — C8583 Other specified types of non-Hodgkin lymphoma, intra-abdominal lymph nodes: Secondary | ICD-10-CM

## 2013-06-29 MED ORDER — ACETAMINOPHEN 325 MG PO TABS
ORAL_TABLET | ORAL | Status: AC
  Filled 2013-06-29: qty 1

## 2013-06-29 MED ORDER — SODIUM CHLORIDE 0.9 % IV SOLN
Freq: Once | INTRAVENOUS | Status: AC
Start: 2013-06-29 — End: 2013-06-29
  Administered 2013-06-29: 09:00:00 via INTRAVENOUS

## 2013-06-29 MED ORDER — DIPHENHYDRAMINE HCL 25 MG PO CAPS
50.0000 mg | ORAL_CAPSULE | Freq: Once | ORAL | Status: AC
  Administered 2013-06-29: 25 mg via ORAL

## 2013-06-29 MED ORDER — DIPHENHYDRAMINE HCL 25 MG PO CAPS
ORAL_CAPSULE | ORAL | Status: AC
  Filled 2013-06-29: qty 2

## 2013-06-29 MED ORDER — ACETAMINOPHEN 325 MG PO TABS
650.0000 mg | ORAL_TABLET | Freq: Once | ORAL | Status: AC
  Administered 2013-06-29: 650 mg via ORAL

## 2013-06-29 MED ORDER — SODIUM CHLORIDE 0.9 % IV SOLN
375.0000 mg/m2 | Freq: Once | INTRAVENOUS | Status: AC
  Administered 2013-06-29: 700 mg via INTRAVENOUS
  Filled 2013-06-29: qty 70

## 2013-06-29 NOTE — Patient Instructions (Signed)
[facility] Discharge Instructions for Patients Receiving Chemotherapy  Today you received the following chemotherapy agents: Rituxan   To help prevent nausea and vomiting after your treatment, we encourage you to take your nausea medication as prescribed by your physician.    If you develop nausea and vomiting that is not controlled by your nausea medication, call the clinic.   BELOW ARE SYMPTOMS THAT SHOULD BE REPORTED IMMEDIATELY:  *FEVER GREATER THAN 100.5 F  *CHILLS WITH OR WITHOUT FEVER  NAUSEA AND VOMITING THAT IS NOT CONTROLLED WITH YOUR NAUSEA MEDICATION  *UNUSUAL SHORTNESS OF BREATH  *UNUSUAL BRUISING OR BLEEDING  TENDERNESS IN MOUTH AND THROAT WITH OR WITHOUT PRESENCE OF ULCERS  *URINARY PROBLEMS  *BOWEL PROBLEMS  UNUSUAL RASH Items with * indicate a potential emergency and should be followed up as soon as possible.  Feel free to call the clinic you have any questions or concerns. The clinic phone number is [phone].    

## 2013-06-29 NOTE — Progress Notes (Signed)
0932- Rituxan started at 100 ml/hr x 50 ml--30 mins. VS and patient stable.  1002- Rituxan increased to 200 ml/hr for remainder of volume. No questions/concerns at this time. VS and patient stable.

## 2013-07-02 ENCOUNTER — Telehealth: Payer: Self-pay | Admitting: Neurology

## 2013-07-02 NOTE — Telephone Encounter (Signed)
PATIENT  STOP TAKING THE TOPAMAX BECAUSE IT WAS UPSETTING HER TUMMY AND SHE WANTED YOU TO KNOW

## 2013-07-03 ENCOUNTER — Ambulatory Visit: Payer: Medicare Other | Admitting: Cardiology

## 2013-07-04 NOTE — Telephone Encounter (Signed)
Patient reportedly did not tolerate topamax, so she stopped.  She tried amitriptyline in the past but also had side effects.  If agreeable, we can suggest that she try nortriptyline 10mg  at bedtime.  It is similar to amitriptyline but tends to be better tolerated.

## 2013-07-04 NOTE — Telephone Encounter (Signed)
Patient wishes to wait with the nortriptyline 10 mg at HS  She has just completed Chemo and would just like to have a break she will call me next week and discuss more

## 2013-07-05 ENCOUNTER — Encounter: Payer: Self-pay | Admitting: Physician Assistant

## 2013-07-05 ENCOUNTER — Ambulatory Visit (INDEPENDENT_AMBULATORY_CARE_PROVIDER_SITE_OTHER): Payer: Medicare Other | Admitting: Physician Assistant

## 2013-07-05 ENCOUNTER — Telehealth: Payer: Self-pay | Admitting: *Deleted

## 2013-07-05 ENCOUNTER — Telehealth: Payer: Self-pay | Admitting: Gastroenterology

## 2013-07-05 ENCOUNTER — Other Ambulatory Visit (INDEPENDENT_AMBULATORY_CARE_PROVIDER_SITE_OTHER): Payer: Medicare Other

## 2013-07-05 VITALS — BP 150/82 | HR 76 | Ht 62.0 in | Wt 155.4 lb

## 2013-07-05 DIAGNOSIS — K5732 Diverticulitis of large intestine without perforation or abscess without bleeding: Secondary | ICD-10-CM

## 2013-07-05 DIAGNOSIS — R1032 Left lower quadrant pain: Secondary | ICD-10-CM

## 2013-07-05 LAB — CBC WITH DIFFERENTIAL/PLATELET
BASOS ABS: 0.1 10*3/uL (ref 0.0–0.1)
BASOS PCT: 0.6 % (ref 0.0–3.0)
EOS PCT: 3.5 % (ref 0.0–5.0)
Eosinophils Absolute: 0.3 10*3/uL (ref 0.0–0.7)
HCT: 39.8 % (ref 36.0–46.0)
HEMOGLOBIN: 12.8 g/dL (ref 12.0–15.0)
Lymphocytes Relative: 32.6 % (ref 12.0–46.0)
Lymphs Abs: 2.8 10*3/uL (ref 0.7–4.0)
MCHC: 32.1 g/dL (ref 30.0–36.0)
MCV: 88.1 fl (ref 78.0–100.0)
MONO ABS: 0.9 10*3/uL (ref 0.1–1.0)
Monocytes Relative: 10.8 % (ref 3.0–12.0)
NEUTROS ABS: 4.6 10*3/uL (ref 1.4–7.7)
Neutrophils Relative %: 52.5 % (ref 43.0–77.0)
PLATELETS: 342 10*3/uL (ref 150.0–400.0)
RBC: 4.51 Mil/uL (ref 3.87–5.11)
RDW: 15.3 % — ABNORMAL HIGH (ref 11.5–14.6)
WBC: 8.7 10*3/uL (ref 4.5–10.5)

## 2013-07-05 MED ORDER — SACCHAROMYCES BOULARDII 250 MG PO CAPS: 250.0000 mg | ORAL_CAPSULE | Freq: Two times a day (BID) | ORAL | Status: DC

## 2013-07-05 MED ORDER — TRAMADOL HCL 50 MG PO TABS: ORAL_TABLET | ORAL | Status: DC

## 2013-07-05 MED ORDER — AMOXICILLIN-POT CLAVULANATE 875-125 MG PO TABS: ORAL_TABLET | ORAL | Status: DC

## 2013-07-05 NOTE — Patient Instructions (Signed)
Please go to the basement level to have your labs drawn.  We sent prescriptions to [place]. 1. Ultram for pain 2. Augmentin 875 mg.  3. Florastor  We have given you samples of Florastor.   Call back if your symptoms have not improved after 4-5 days or if symptoms worsen.

## 2013-07-05 NOTE — Telephone Encounter (Signed)
Pt states she is having pain in her left lower quadrant and states that it is now hurting on her right side also. States she thinks she is having a diverticulitis flare. Pt scheduled to see Nicoletta Ba PA today at 3:30pm. Pt aware of appt.

## 2013-07-05 NOTE — Telephone Encounter (Signed)
Received vm call this am from pt stating that she was having some problems with diverticulitis since Sun & not getting better & she thinks she needs an antibiotic.   Returned call @ 9:15 am & suggested she call her GI doc.

## 2013-07-05 NOTE — Progress Notes (Signed)
Subjective:    Patient ID: Amanda Kane, female    DOB: 1940-08-14, 73 y.o.   MRN: [id number]  HPI  Amanda Kane is a pleasant 73 year old white female known to Dr. Deatra Ina with history of diverticular disease. She also has history of interstitial cystitis fibromyalgia and GERD. She is currently being treated for a low-grade well-differentiated B cell lymphocytic lymphoma. She had CT scan on 05/28/2013 which showed mesenteric adenopathy persistent but without significant change, none of the nodes greater than 1.3 cm. She was seen  thereafter by Dr. Beryle Beams  She has developed axillary lymphadenopathy which is new and therefore decision was made to start treatment. She has  just completed 4 weekly infusions of Rituxan. Patient says that with the chemotherapy she did have increased symptoms with interstitial cystitis with burning in her bladder. That has resolved. Now over the past 5 days she has been having abdominal discomfort which she says started this past Sunday in her left mid abdomen and then spread throughout her abdomen. She has had some very low-grade fevers no chills or sweats. He sat a few episodes of mild diarrhea no melena or hematochezia. She feels her pain is increased postprandially and feels like her prior episodes of diverticulitis in quality because the pain as aching. Amanda Kane is in a different location than where she has had diverticulitis previously- more in her upper abdomen into the right.    Review of Systems  Constitutional: Positive for appetite change.  HENT: Negative.   Eyes: Negative.   Respiratory: Negative.   Cardiovascular: Negative.   Gastrointestinal: Positive for abdominal pain and diarrhea.  Endocrine: Negative.   Genitourinary: Negative.   Musculoskeletal: Negative.   Allergic/Immunologic: Negative.   Neurological: Negative.   Hematological: Negative.   Psychiatric/Behavioral: Negative.    Outpatient Prescriptions Prior to Visit  Medication Sig Dispense Refill   . aspirin 81 MG tablet Take 81 mg by mouth daily.      Marland Kitchen FAMOTIDINE PO Take 1 tablet by mouth daily as needed (acid reflux).      Marland Kitchen FLUoxetine (PROZAC) 20 MG capsule Take 1 capsule by mouth every other day.       . gabapentin (NEURONTIN) 100 MG capsule Take 100 mg by mouth as needed.      Marland Kitchen losartan (COZAAR) 50 MG tablet Take 1 tablet (50 mg total) by mouth daily.  90 tablet  3  . Multiple Vitamins-Minerals (MULTIVITAMIN PO) Take 1 tablet by mouth daily.      . pentosan polysulfate (ELMIRON) 100 MG capsule Take 100 mg by mouth as needed.      . phenazopyridine (PYRIDIUM) 100 MG tablet Take 1 tablet (100 mg total) by mouth 3 (three) times daily as needed (urinary pain).  10 tablet  1  . sodium chloride 0.9 % SOLN 250 mL with riTUXimab 10 MG/ML CONC Inject into the vein once.      Marland Kitchen SYNTHROID 75 MCG tablet TAKE 1 TABLET (75 MCG TOTAL) BY MOUTH DAILY.  90 tablet  0  . traMADol (ULTRAM) 50 MG tablet Take 1 tablet by mouth as needed.      . topiramate (TOPAMAX) 50 MG tablet Take 1tab qhs x7d, then 1.5tab qhs x7d, then 2tabs qhs.  60 tablet  0   No facility-administered medications prior to visit.   Allergies  Allergen Reactions  . Amlodipine     headache  . Atenolol     Headache   . Atorvastatin Other (See Comments)    "joints ache."  .  Celecoxib Other (See Comments)    "burns my stomach."  . Ciprofloxacin Itching  . Ezetimibe     REACTION: Aches  . Hctz [Hydrochlorothiazide]     headache  . Lisinopril Cough  . Losartan     headache  . Macrobid [Nitrofurantoin Macrocrystal] Itching  . Metronidazole   . Pregabalin     REACTION: Numb  . Simvastatin     REACTION: Aches  . Sulfonamide Derivatives Itching  . Verapamil Other (See Comments)    Migraine headache   Patient Active Problem List   Diagnosis Date Noted  . Migraine 02/22/2013  . 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 Gallstones 09/28/2012  . Hypertension 02/03/2012  . Mesenteric  lymphadenopathy 11/29/2011  . BREAST MASS, RIGHT 01/28/2010  . UNSPECIFIED VITAMIN D DEFICIENCY 11/25/2009  . DIVERTICULOSIS-COLON 06/24/2009  . BACK PAIN, LUMBAR 10/15/2008  . COLONIC POLYPS 07/02/2008  . HEARING LOSS 06/21/2007  . ARTHRITIS 06/13/2007  . HYPOTHYROIDISM NOS 09/27/2006  . GASTROPARESIS 08/10/2006  . ESOPHAGEAL STRICTURE 06/08/2006  . OSTEOPOROSIS 03/17/2000  . HYPERLIPIDEMIA 10/16/1998  . GERD 11/14/1997  . FIBROMYALGIA 05/18/1983   History  Substance Use Topics  . Smoking status: Never Smoker   . Smokeless tobacco: Never Used  . Alcohol Use: Yes     Comment: Glass wine per night   family history includes Coronary artery disease in her brother; Heart failure in her mother; Stomach cancer in her paternal grandmother; Stroke in her father. There is no history of Rectal cancer, Esophageal cancer, or Colon cancer.      Objective:   Physical Exam well-developed older white female in no acute distress, pleasant blood pressure 150/82 pulse 76 height 5 foot 2 weight 155. HEENT; nontraumatic normocephalic EOMI PERRLA sclera anicteric, Supple; no JVD, Cardiovascular; regular rate and rhythm with S1-S2 no murmur or gallop, Pulmonary; clear bilaterally, Abdomen; soft bowel sounds are present she is tender across the upper abdomen and into the right upper quadrant no guarding or rebound no palpable mass or hepatosplenomegaly, Rectal ;exam not done, Extremities; no clubbing cyanosis or edema skin warm and dry, Psych; mood and affect appropriate          Assessment & Plan:  #79 73 year old female with a five-day history of aching abdominal discomfort now located more in the upper abdomen into the right and constant in nature. Patient does have severe diverticular disease and has diverticuli previously documented in the transverse colon and hepatic flexure. Her current pain may represent diverticulitis, since she has just undergone chemotherapy may also have another inflammatory  process . #2 low-grade well-differentiated B-cell lymphoma 7 lymphoma #3 interstitial cystitis #4 fibromyalgia #5 prior history of colon polyps last colonoscopy 2014 with one tubular adenoma  Plan; CBC with differential today Start Augmentin 875 mg by mouth twice daily x14 days to be taken with food Florastor twice daily x2-3 weeks Ultram 50 mg every 4-6 hours as needed for pain #40 no refills Patient is asked to call at any point if her symptoms worsen or should she fail to improve after 4-5 days on antibiotics and at that time may need repeat imaging

## 2013-07-06 NOTE — Progress Notes (Signed)
Reviewed and agree with management. Hitoshi Werts D. Anaijah Augsburger, M.D., FACG  

## 2013-07-13 ENCOUNTER — Other Ambulatory Visit (HOSPITAL_BASED_OUTPATIENT_CLINIC_OR_DEPARTMENT_OTHER): Payer: Medicare Other

## 2013-07-13 ENCOUNTER — Ambulatory Visit (HOSPITAL_BASED_OUTPATIENT_CLINIC_OR_DEPARTMENT_OTHER): Payer: Medicare Other | Admitting: Oncology

## 2013-07-13 ENCOUNTER — Telehealth: Payer: Self-pay | Admitting: *Deleted

## 2013-07-13 VITALS — BP 171/77 | HR 65 | Temp 98.0°F | Resp 18 | Ht 62.0 in | Wt 157.1 lb

## 2013-07-13 DIAGNOSIS — R5382 Chronic fatigue, unspecified: Secondary | ICD-10-CM

## 2013-07-13 DIAGNOSIS — K5730 Diverticulosis of large intestine without perforation or abscess without bleeding: Secondary | ICD-10-CM

## 2013-07-13 DIAGNOSIS — C8583 Other specified types of non-Hodgkin lymphoma, intra-abdominal lymph nodes: Secondary | ICD-10-CM

## 2013-07-13 DIAGNOSIS — IMO0001 Reserved for inherently not codable concepts without codable children: Secondary | ICD-10-CM

## 2013-07-13 DIAGNOSIS — C8303 Small cell B-cell lymphoma, intra-abdominal lymph nodes: Secondary | ICD-10-CM

## 2013-07-13 DIAGNOSIS — G9332 Myalgic encephalomyelitis/chronic fatigue syndrome: Secondary | ICD-10-CM

## 2013-07-13 LAB — CBC WITH DIFFERENTIAL/PLATELET
BASO%: 1.9 % (ref 0.0–2.0)
Basophils Absolute: 0.1 10*3/uL (ref 0.0–0.1)
EOS ABS: 0.3 10*3/uL (ref 0.0–0.5)
EOS%: 4.8 % (ref 0.0–7.0)
HCT: 39.8 % (ref 34.8–46.6)
HEMOGLOBIN: 12.8 g/dL (ref 11.6–15.9)
LYMPH#: 2 10*3/uL (ref 0.9–3.3)
LYMPH%: 29.7 % (ref 14.0–49.7)
MCH: 27.8 pg (ref 25.1–34.0)
MCHC: 32.2 g/dL (ref 31.5–36.0)
MCV: 86.3 fL (ref 79.5–101.0)
MONO#: 0.8 10*3/uL (ref 0.1–0.9)
MONO%: 11.5 % (ref 0.0–14.0)
NEUT%: 52.1 % (ref 38.4–76.8)
NEUTROS ABS: 3.5 10*3/uL (ref 1.5–6.5)
Platelets: 319 10*3/uL (ref 145–400)
RBC: 4.61 10*6/uL (ref 3.70–5.45)
RDW: 14.9 % — AB (ref 11.2–14.5)
WBC: 6.8 10*3/uL (ref 3.9–10.3)

## 2013-07-13 LAB — COMPREHENSIVE METABOLIC PANEL (CC13)
ALBUMIN: 3.8 g/dL (ref 3.5–5.0)
ALT: 18 U/L (ref 0–55)
AST: 17 U/L (ref 5–34)
Alkaline Phosphatase: 81 U/L (ref 40–150)
Anion Gap: 9 mEq/L (ref 3–11)
BUN: 9.6 mg/dL (ref 7.0–26.0)
CALCIUM: 9.4 mg/dL (ref 8.4–10.4)
CHLORIDE: 108 meq/L (ref 98–109)
CO2: 26 mEq/L (ref 22–29)
Creatinine: 0.7 mg/dL (ref 0.6–1.1)
GLUCOSE: 92 mg/dL (ref 70–140)
POTASSIUM: 4 meq/L (ref 3.5–5.1)
Sodium: 143 mEq/L (ref 136–145)
TOTAL PROTEIN: 6.4 g/dL (ref 6.4–8.3)
Total Bilirubin: 0.32 mg/dL (ref 0.20–1.20)

## 2013-07-13 LAB — LACTATE DEHYDROGENASE (CC13): LDH: 204 U/L (ref 125–245)

## 2013-07-13 NOTE — Telephone Encounter (Signed)
appts made and printed. Pt is aware that cs will call w/ appt for CT ABD/PELVIS.Marland KitchenMarland KitchenTD

## 2013-07-13 NOTE — Progress Notes (Signed)
Hematology and Oncology Follow Up Visit  Amanda Kane [id number] 09/20/1940 73 y.o. 07/13/2013 3:11 PM   Principle Diagnosis: Encounter Diagnosis  Name Primary?  . 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 She has chronic abdominal complaints related to recurrent and bouts of diverticulitis and gastritis.A CT scan of the abdomen and pelvis done during one of these episodes back in July of 2013 demonstrated multiple, borderline sized, increased number, of lymph nodes. At time of  evaluations here she had no palpable external adenopathy and no organomegaly. Her main complaint is chronic fatigue and polymyalgia. I did not think that the mesenteric adenopathy was related. She had an initial evaluation in this office by one of my partners who has since left the practice.  She had an elective laparoscopic cholecystectomy on 10/05/2012. At that time a biopsy of one of the mesenteric lymph nodes was done and pathology was consistent with a low-grade, well-differentiated, a B-cell, lymphocytic lymphoma. I initially recommended observation alone. I have been following her with periodic CT scans and clinical exams. Most recent CT done 05/28/2013. Abdominal adenopathy had not changed at that point but she had developed new axillary adenopathy. She became increasingly uncomfortable with the watch and wait approach. We reviewed treatment options at great length along with her husband. I felt single agent Rituxan would be most reasonable initial treatment approach.  She tolerated the Rituxan well but feels that it caused increasing upper abdominal discomfort and nausea. 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 I personally don't think that any of her chronic complaints were related to the  Rituxan.   Medications: reviewed  Allergies:  Allergies  Allergen Reactions  . Amlodipine     headache  . Atenolol     Headache   . Atorvastatin Other (See Comments)    "joints ache."  . Celecoxib Other (See Comments)    "burns my stomach."  . Ciprofloxacin Itching  . Ezetimibe     REACTION: Aches  . Hctz [Hydrochlorothiazide]     headache  . Lisinopril Cough  . Losartan     headache  . Macrobid [Nitrofurantoin Macrocrystal] Itching  . Metronidazole   . Pregabalin     REACTION: Numb  . Simvastatin     REACTION: Aches  . Sulfonamide Derivatives Itching  . Verapamil Other (See Comments)    Migraine headache    Review of Systems: Hematology:  No bleeding or bruising ENT ROS: No sore throat Breast ROS:  Respiratory ROS: No cough or dyspnea  Cardiovascular ROS:  No chest pain or palpitations Gastrointestinal ROS:  Intermittent epigastric pain. Intermittent lower abdominal pain left greater than right. No hematochezia or melena.  Genito-Urinary ROS: Chronic interstitial cystitis Musculoskeletal ROS: Chronic fibromyalgia symptoms Neurological ROS: No new headache or change in vision Dermatological ROS: No rash or ecchymosis Remaining ROS negative:   Physical Exam: Blood pressure 171/77, pulse 65, temperature 98 F (36.7 C), temperature source Oral, resp. rate 18, height 5\' 2"  (1.575 m), weight 157 lb 1.6 oz (71.26 kg), SpO2 98.00%. Wt Readings from Last 3 Encounters:  07/13/13 157 lb 1.6 oz (71.26 kg)  07/05/13 155 lb 6 oz (70.478 kg)  06/18/13 159 lb 11.2 oz (72.439 kg)     General appearance: Well-nourished Caucasian woman HENNT: Pharynx no erythema, exudate, mass, or ulcer.  No thyromegaly or thyroid nodules Lymph nodes: No cervical, supraclavicular, or axillary lymphadenopathy Breasts:  Lungs: Clear to auscultation, resonant to percussion throughout Heart: Regular rhythm, no murmur, no gallop, no rub, no click, no edema Abdomen: Soft, nontender, normal  bowel sounds, no mass, no organomegaly Extremities: No edema, no calf tenderness Musculoskeletal: no joint deformities GU:  Vascular: Carotid pulses 2+ Neurologic: Alert, oriented, PERRLA,  , cranial nerves grossly normal, motor strength 5 over 5, reflexes 1+ symmetric, upper body coordination normal, gait normal, Skin: No rash or ecchymosis  Lab Results: CBC W/Diff    Component Value Date/Time   WBC 6.8 07/13/2013 0903   WBC 8.7 07/05/2013 1623   RBC 4.61 07/13/2013 0903   RBC 4.51 07/05/2013 1623   HGB 12.8 07/13/2013 0903   HGB 12.8 07/05/2013 1623   HCT 39.8 07/13/2013 0903   HCT 39.8 07/05/2013 1623   PLT 319 07/13/2013 0903   PLT 342.0 07/05/2013 1623   MCV 86.3 07/13/2013 0903   MCV 88.1 07/05/2013 1623   MCH 27.8 07/13/2013 0903   MCH 26.5 10/06/2012 0835   MCHC 32.2 07/13/2013 0903   MCHC 32.1 07/05/2013 1623   RDW 14.9* 07/13/2013 0903   RDW 15.3* 07/05/2013 1623   LYMPHSABS 2.0 07/13/2013 0903   LYMPHSABS 2.8 07/05/2013 1623   MONOABS 0.8 07/13/2013 0903   MONOABS 0.9 07/05/2013 1623   EOSABS 0.3 07/13/2013 0903   EOSABS 0.3 07/05/2013 1623   BASOSABS 0.1 07/13/2013 0903   BASOSABS 0.1 07/05/2013 1623     Chemistry      Component Value Date/Time   NA 143 07/13/2013 0904   NA 139 10/11/2012 0935   K 4.0 07/13/2013 0904   K 4.5 10/11/2012 0935   CL 104 10/11/2012 0935   CL 106 08/30/2012 0918   CO2 26 07/13/2013 0904   CO2 29 10/11/2012 0935   BUN 9.6 07/13/2013 0904   BUN 8 10/11/2012 0935   CREATININE 0.7 07/13/2013 0904   CREATININE 0.8 10/11/2012 0935      Component Value Date/Time   CALCIUM 9.4 07/13/2013 0904   CALCIUM 9.8 10/11/2012 0935   ALKPHOS 81 07/13/2013 0904   ALKPHOS 95 10/11/2012 0935   AST 17 07/13/2013 0904   AST 77* 10/11/2012 0935   ALT 18 07/13/2013 0904   ALT 85* 10/11/2012 0935   BILITOT 0.32 07/13/2013 0904   BILITOT 0.4 10/11/2012 0935         Impression:  #1. Low-grade B-cell non-Hodgkin's lymphoma  ( WDLL) Now status post one course of Rituxan. As stated  in my previous note, new data does not support routine use of maintenance  Rituxan. I again reviewed the available treatments for low-grade lymphoma if needed in the future with her and her husband. They have some family members who are in the medical field. They asked about a second opinion at [facility]. They were given the names of my 3 favorite hematologists  at [facility]: Dr. Sabas Sous, Dr. Oris Drone, and Dr. Donato Heinz. I will make her wishes for a referral.  I will transition her care to Dr. Alvy Bimler. Lab and CT scans in 3 months.    #2. Diverticulosis with intermittent diverticulitis  #3. Fibromyalgia syndrome.  #4. GERD with esophageal reflux and stricture  #5. Diverticulosis with episodes of diverticulitis  #6. Peptic ulcer disease  #7. Hypothyroid on replacement  #8. Interstitial cystitis  #9. Essential hypertension-benign  #10. Decrease in vibration sensation over the fingers:  Decreased total IgA and IgM  immunoglobulin, normal IgG IgA,   CC: Patient Care Team: Abner Greenspan, MD as PCP - General (Family Medicine) Nobie Putnam, MD (Hematology and Oncology) Reece Packer, MD as Attending Physician (Urology) Gearlean Alf, MD as Attending Physician (Orthopedic Surgery) Lelon Perla, MD (Cardiology) Inda Castle, MD (Gastroenterology)   Annia Belt, MD 2/27/20153:11 PM

## 2013-07-13 NOTE — Telephone Encounter (Signed)
appts made printed. Pt is aware that cs will call w/ appt for CT ABD/PELVIS.Marland KitchenMarland KitchenTD

## 2013-07-14 ENCOUNTER — Encounter: Payer: Self-pay | Admitting: Oncology

## 2013-07-16 ENCOUNTER — Encounter: Payer: Self-pay | Admitting: Oncology

## 2013-07-18 ENCOUNTER — Telehealth: Payer: Self-pay | Admitting: Oncology

## 2013-07-18 NOTE — Telephone Encounter (Signed)
Called pt with appt to see Dr. Gerald Dexter @ [facility] on 08/01/13@12 :00.  Pt stated she cannot make appt. She will call [facility] to schedule appt. Medical records faxed,slides will be fedex'ed.

## 2013-08-13 ENCOUNTER — Other Ambulatory Visit: Payer: Self-pay | Admitting: Family Medicine

## 2013-08-13 ENCOUNTER — Other Ambulatory Visit: Payer: Self-pay | Admitting: Physician Assistant

## 2013-08-13 ENCOUNTER — Telehealth: Payer: Self-pay | Admitting: *Deleted

## 2013-08-13 ENCOUNTER — Other Ambulatory Visit: Payer: Self-pay | Admitting: Hematology and Oncology

## 2013-08-13 ENCOUNTER — Other Ambulatory Visit: Payer: Self-pay | Admitting: Internal Medicine

## 2013-08-13 DIAGNOSIS — C8303 Small cell B-cell lymphoma, intra-abdominal lymph nodes: Secondary | ICD-10-CM

## 2013-08-13 NOTE — Telephone Encounter (Signed)
Done, I placed order

## 2013-08-13 NOTE — Telephone Encounter (Signed)
Last filled 05/11/14--please advise--Last office visit 02/23/13

## 2013-08-13 NOTE — Telephone Encounter (Signed)
Left Vm for pt to return nurse's call so I can inform her Dr. Alvy Bimler added the order she requested.

## 2013-08-13 NOTE — Telephone Encounter (Signed)
Former pt of Dr. Beryle Beams states she saw Dr. Gerald Dexter at [facility] a few weeks ago.  Dr. Gerald Dexter recommended pt have CT Chest added onto the CT Abd/Pelvis scheduled for 4/06.  She asks if Dr. Alvy Bimler willing to order CT Chest?

## 2013-08-14 ENCOUNTER — Other Ambulatory Visit: Payer: Self-pay | Admitting: *Deleted

## 2013-08-14 NOTE — Telephone Encounter (Signed)
Please refill for 6 months 

## 2013-08-14 NOTE — Telephone Encounter (Signed)
Please refill this medicine for 6 months ,thanks

## 2013-08-14 NOTE — Telephone Encounter (Signed)
Please refill for 6 mo 

## 2013-08-19 ENCOUNTER — Other Ambulatory Visit: Payer: Self-pay | Admitting: Hematology and Oncology

## 2013-08-19 DIAGNOSIS — C8303 Small cell B-cell lymphoma, intra-abdominal lymph nodes: Secondary | ICD-10-CM

## 2013-08-20 ENCOUNTER — Other Ambulatory Visit (HOSPITAL_BASED_OUTPATIENT_CLINIC_OR_DEPARTMENT_OTHER): Payer: Medicare Other

## 2013-08-20 ENCOUNTER — Encounter (HOSPITAL_COMMUNITY): Payer: Self-pay

## 2013-08-20 ENCOUNTER — Ambulatory Visit (HOSPITAL_COMMUNITY)
Admission: RE | Admit: 2013-08-20 | Discharge: 2013-08-20 | Disposition: A | Discharge status: Home / Self Care | Payer: Medicare Other | Source: Ambulatory Visit | Attending: Oncology | Admitting: Oncology

## 2013-08-20 DIAGNOSIS — R599 Enlarged lymph nodes, unspecified: Secondary | ICD-10-CM | POA: Insufficient documentation

## 2013-08-20 DIAGNOSIS — C8583 Other specified types of non-Hodgkin lymphoma, intra-abdominal lymph nodes: Secondary | ICD-10-CM

## 2013-08-20 DIAGNOSIS — C8303 Small cell B-cell lymphoma, intra-abdominal lymph nodes: Secondary | ICD-10-CM

## 2013-08-20 DIAGNOSIS — K5730 Diverticulosis of large intestine without perforation or abscess without bleeding: Secondary | ICD-10-CM | POA: Insufficient documentation

## 2013-08-20 DIAGNOSIS — C8589 Other specified types of non-Hodgkin lymphoma, extranodal and solid organ sites: Secondary | ICD-10-CM | POA: Insufficient documentation

## 2013-08-20 LAB — CBC WITH DIFFERENTIAL/PLATELET
BASO%: 1.5 % (ref 0.0–2.0)
BASOS ABS: 0.1 10*3/uL (ref 0.0–0.1)
EOS%: 3.6 % (ref 0.0–7.0)
Eosinophils Absolute: 0.3 10*3/uL (ref 0.0–0.5)
HCT: 40.9 % (ref 34.8–46.6)
HGB: 13.2 g/dL (ref 11.6–15.9)
LYMPH%: 26.8 % (ref 14.0–49.7)
MCH: 28 pg (ref 25.1–34.0)
MCHC: 32.3 g/dL (ref 31.5–36.0)
MCV: 86.5 fL (ref 79.5–101.0)
MONO#: 0.8 10*3/uL (ref 0.1–0.9)
MONO%: 11.5 % (ref 0.0–14.0)
NEUT#: 4.1 10*3/uL (ref 1.5–6.5)
NEUT%: 56.6 % (ref 38.4–76.8)
PLATELETS: 309 10*3/uL (ref 145–400)
RBC: 4.73 10*6/uL (ref 3.70–5.45)
RDW: 15.8 % — ABNORMAL HIGH (ref 11.2–14.5)
WBC: 7.2 10*3/uL (ref 3.9–10.3)
lymph#: 1.9 10*3/uL (ref 0.9–3.3)

## 2013-08-20 LAB — COMPREHENSIVE METABOLIC PANEL (CC13)
ALT: 16 U/L (ref 0–55)
ANION GAP: 11 meq/L (ref 3–11)
AST: 16 U/L (ref 5–34)
Albumin: 4.1 g/dL (ref 3.5–5.0)
Alkaline Phosphatase: 78 U/L (ref 40–150)
BUN: 11.9 mg/dL (ref 7.0–26.0)
CALCIUM: 9.3 mg/dL (ref 8.4–10.4)
CHLORIDE: 108 meq/L (ref 98–109)
CO2: 24 meq/L (ref 22–29)
CREATININE: 0.8 mg/dL (ref 0.6–1.1)
GLUCOSE: 91 mg/dL (ref 70–140)
Potassium: 4.1 mEq/L (ref 3.5–5.1)
Sodium: 143 mEq/L (ref 136–145)
Total Bilirubin: 0.43 mg/dL (ref 0.20–1.20)
Total Protein: 6.8 g/dL (ref 6.4–8.3)

## 2013-08-20 LAB — LACTATE DEHYDROGENASE (CC13): LDH: 226 U/L (ref 125–245)

## 2013-08-20 MED ORDER — IOHEXOL 300 MG/ML  SOLN
100.0000 mL | Freq: Once | INTRAMUSCULAR | Status: AC | PRN
Start: 2013-08-20 — End: 2013-08-20
  Administered 2013-08-20: 100 mL via INTRAVENOUS

## 2013-08-22 ENCOUNTER — Telehealth: Payer: Self-pay | Admitting: *Deleted

## 2013-08-22 NOTE — Telephone Encounter (Signed)
Message copied by [name] on Wed Aug 22, 2013 10:14 AM ------      Message from: Annia Belt      Created: Mon Aug 20, 2013  1:41 PM       Call pt: CT shows multiple small lymph nodes all a little smaller.  Keep appt w Dr Alvy Bimler for 4/13 ------

## 2013-08-22 NOTE — Telephone Encounter (Signed)
Informed pt of Dr Azucena Freed message regarding CT.  She does know about appt with Dr Alvy Bimler 4/13 & plans to see her.

## 2013-08-27 ENCOUNTER — Ambulatory Visit (HOSPITAL_BASED_OUTPATIENT_CLINIC_OR_DEPARTMENT_OTHER): Payer: Medicare Other | Admitting: Hematology and Oncology

## 2013-08-27 ENCOUNTER — Encounter: Payer: Self-pay | Admitting: Hematology and Oncology

## 2013-08-27 ENCOUNTER — Ambulatory Visit: Payer: Medicare Other | Admitting: Cardiology

## 2013-08-27 ENCOUNTER — Other Ambulatory Visit: Payer: Medicare Other

## 2013-08-27 VITALS — BP 150/68 | HR 75 | Temp 98.2°F | Resp 18 | Ht 62.0 in | Wt 153.9 lb

## 2013-08-27 DIAGNOSIS — M949 Disorder of cartilage, unspecified: Secondary | ICD-10-CM

## 2013-08-27 DIAGNOSIS — R599 Enlarged lymph nodes, unspecified: Secondary | ICD-10-CM

## 2013-08-27 DIAGNOSIS — C8303 Small cell B-cell lymphoma, intra-abdominal lymph nodes: Secondary | ICD-10-CM

## 2013-08-27 DIAGNOSIS — M899 Disorder of bone, unspecified: Secondary | ICD-10-CM

## 2013-08-27 DIAGNOSIS — K5732 Diverticulitis of large intestine without perforation or abscess without bleeding: Secondary | ICD-10-CM

## 2013-08-27 DIAGNOSIS — C8583 Other specified types of non-Hodgkin lymphoma, intra-abdominal lymph nodes: Secondary | ICD-10-CM

## 2013-08-27 MED ORDER — CIPROFLOXACIN HCL 500 MG PO TABS: 500.0000 mg | ORAL_TABLET | Freq: Two times a day (BID) | ORAL | Status: DC

## 2013-08-27 NOTE — Progress Notes (Signed)
[place] FOLLOW-UP progress notes  Patient Care Team: Abner Greenspan, MD as PCP - General (Family Medicine) Reece Packer, MD as Attending Physician (Urology) Gearlean Alf, MD as Attending Physician (Orthopedic Surgery) Lelon Perla, MD (Cardiology) Inda Castle, MD (Gastroenterology) Heath Lark, MD as Consulting Physician (Hematology and Oncology)  CHIEF COMPLAINTS/PURPOSE OF VISIT:  Small lymphocytic lymphoma  HISTORY OF PRESENTING ILLNESS:  Amanda Kane 73 y.o. female was transferred to my care after her prior physician has left. A review of her records and the summary of her oncology history is as follows:  She has chronic abdominal complaints related to recurrent and bouts of diverticulitis and gastritis. A CT scan of the abdomen and pelvis done during one of these episodes back in July of 2013 demonstrated multiple, borderline sized, increased number, of lymph nodes. At time of evaluations here she had no palpable external adenopathy and no organomegaly. Her main complaint is chronic fatigue and polymyalgia.   She had an elective laparoscopic cholecystectomy on 10/05/2012. At that time a biopsy of one of the mesenteric lymph nodes was done and pathology was consistent with a low-grade, well-differentiated, a B-cell, lymphocytic lymphoma. She was recommended observation alone, periodic CT scans and clinical exams. Most recent CT done 05/28/2013 showed abdominal adenopathy had not changed at that point but she had developed new axillary adenopathy. She became increasingly uncomfortable with the watch and wait approach. It was felt that single agent Rituxan would be most reasonable initial treatment approach.  She completed 4 weekly doses of Rituxan given between January to 06/30/2013 as first-line treatment for low-grade, well-differentiated lymphocytic lymphoma. She tolerated the Rituxan well but feels that it caused increasing upper abdominal discomfort  and nausea. 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   Repeat CT scan on 08/20/2013 show stable disease. The patient had second opinion at [facility] and was recommended to same treatment.  Last week, she started to have a flare of lower abdominal pain with associated diarrhea. The patient have recurrent diverticulitis many times in the past. She just completed a course of Augmentin approximately 6 weeks ago. She denies any nausea or vomiting. She denies any new lymphadenopathy. She complained of tenderness on palpation on her bones all over her body.  MEDICAL HISTORY:  Past Medical History  Diagnosis Date  . Arthritis   . Esophageal reflux   . Myalgia and myositis, unspecified   . Pure hypercholesterolemia   . Unspecified hypothyroidism   . Colonic polyp     last one around 2008.  Due one now.   . Interstitial cystitis   . Hypertension   . PUD (peptic ulcer disease)   . MVP (mitral valve prolapse)   . Adenopathy 02/25/2012  . Diverticulosis     past diverticulitis; last 2 years ago. no resection.   . Fibromyalgia   . Lymphadenopathy, abdominal 09/01/2012  . Allergy   . Blood transfusion without reported diagnosis   . Cancer     malginant polyps colono 20 years ago  . Eczema     Hx: of  . H/O hiatal hernia   . Headache(784.0)     Hx: of   . 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 History of cancer chemotherapy     SURGICAL HISTORY: Past Surgical History  Procedure Laterality Date  . Hysteroscopy w/d&c      [place]  . Appendectomy    . Inner ear surgery    .  Breast lumpectomy      bilateral; benign  . Tubal ligation    . Eye surgery      cateracts  . Tonsillectomy    . Left knee surgery    . Cataract extraction w/ intraocular lens  implant, bilateral      Hx: of  . Colonoscopy w/ biopsies and polypectomy      Hx: of  . Dilation and curettage of uterus    . Cholecystectomy N/A 10/05/2012    Procedure: LAPAROSCOPIC  CHOLECYSTECTOMY WITH INTRAOPERATIVE CHOLANGIOGRAM;  Surgeon: Adin Hector, MD;  Location: [place];  Service: General;  Laterality: N/A;  . Lymph node biopsy N/A 10/05/2012    Procedure:  LAPAROSCOPIC EXCISIONAL BIOPSY OF MESENTERIC LYMPH NODE ;  Surgeon: Adin Hector, MD;  Location: [place];  Service: General;  Laterality: N/A;    SOCIAL HISTORY: History   Social History  . Marital Status: Married    Spouse Name: N/A    Number of Children: 2  . Years of Education: N/A   Occupational History  . Retired     retired Pharmacist, hospital   Social History Main Topics  . Smoking status: Never Smoker   . Smokeless tobacco: Never Used  . Alcohol Use: Yes     Comment: Glass wine per night  . Drug Use: No  . Sexual Activity: Yes   Other Topics Concern  . Not on file   Social History Narrative  . No narrative on file    FAMILY HISTORY: Family History  Problem Relation Age of Onset  . Coronary artery disease Brother     MI at age 2  . Heart failure Mother   . Stroke Father   . Stomach cancer Paternal Grandmother   . Rectal cancer Neg Hx   . Esophageal cancer Neg Hx   . Colon cancer Neg Hx     ALLERGIES:  is allergic to amlodipine; atenolol; atorvastatin; celecoxib; ciprofloxacin; ezetimibe; hctz; lisinopril; losartan; macrobid; metronidazole; pregabalin; simvastatin; sulfonamide derivatives; and verapamil.  MEDICATIONS:  Current Outpatient Prescriptions  Medication Sig Dispense Refill  . aspirin 81 MG tablet Take 81 mg by mouth daily.      Marland Kitchen FAMOTIDINE PO Take 1 tablet by mouth daily as needed (acid reflux).      Marland Kitchen FLUoxetine (PROZAC) 20 MG capsule Take 1 capsule by mouth every other day.       Marland Kitchen FLUoxetine (PROZAC) 20 MG capsule TAKE 1 CAPSULE (20 MG TOTAL) BY MOUTH DAILY.  30 capsule  5  . gabapentin (NEURONTIN) 100 MG capsule Take 100 mg by mouth as needed.      Marland Kitchen losartan (COZAAR) 50 MG tablet Take 1 tablet (50 mg total) by mouth daily.  90 tablet  3  . Multiple  Vitamins-Minerals (MULTIVITAMIN PO) Take 1 tablet by mouth daily.      . pentosan polysulfate (ELMIRON) 100 MG capsule Take 100 mg by mouth as needed.      . phenazopyridine (PYRIDIUM) 100 MG tablet Take 1 tablet (100 mg total) by mouth 3 (three) times daily as needed (urinary pain).  10 tablet  1  . SYNTHROID 75 MCG tablet TAKE 1 TABLET (75 MCG TOTAL) BY MOUTH DAILY.  90 tablet  0  . traMADol (ULTRAM) 50 MG tablet Take 1 tab every 4-6 hours as needed for pain.  30 tablet  0  . ciprofloxacin (CIPRO) 500 MG tablet Take 1 tablet (500 mg total) by mouth 2 (two) times daily.  20 tablet  0   No current facility-administered medications for this visit.    REVIEW OF SYSTEMS:   Constitutional: Denies fevers, chills or abnormal night sweats Eyes: Denies blurriness of vision, double vision or watery eyes Ears, nose, mouth, throat, and face: Denies mucositis or sore throat Respiratory: Denies cough, dyspnea or wheezes Cardiovascular: Denies palpitation, chest discomfort or lower extremity swelling Skin: Denies abnormal skin rashes Lymphatics: Denies new lymphadenopathy or easy bruising Neurological:Denies numbness, tingling or new weaknesses Behavioral/Psych: Mood is stable, no new changes  All other systems were reviewed with the patient and are negative.  PHYSICAL EXAMINATION: ECOG PERFORMANCE STATUS: 1 - Symptomatic but completely ambulatory  Filed Vitals:   08/27/13 1223  BP: 150/68  Pulse: 75  Temp: 98.2 F (36.8 C)  Resp: 18   Filed Weights   08/27/13 1223  Weight: 153 lb 14.4 oz (69.809 kg)    GENERAL:alert, no distress and comfortable SKIN: skin color, texture, turgor are normal, no rashes or significant lesions EYES: normal, conjunctiva are pink and non-injected, sclera clear OROPHARYNX:no exudate, normal lips, buccal mucosa, and tongue  NECK: supple, thyroid normal size, non-tender, without nodularity LYMPH:  Palpable lymphadenopathy in the axillary region but not in the  cervical region or inguinal region  LUNGS: clear to auscultation and percussion with normal breathing effort HEART: regular rate & rhythm and no murmurs without lower extremity edema ABDOMEN:abdomen soft, mildly distended, mild tenderness in the periumbilical area with increased normal bowel sounds Musculoskeletal:no cyanosis of digits and no clubbing  PSYCH: alert & oriented x 3 with fluent speech NEURO: no focal motor/sensory deficits  LABORATORY DATA:  I have reviewed the data as listed Lab Results  Component Value Date   WBC 7.2 08/20/2013   HGB 13.2 08/20/2013   HCT 40.9 08/20/2013   MCV 86.5 08/20/2013   PLT 309 08/20/2013    Recent Labs  10/04/12 1505 10/06/12 0835 10/11/12 0935  05/28/13 1114 07/13/13 0904 08/20/13 1015  NA 142 140 139  < > 143 143 143  K 3.8 4.1 4.5  < > 4.0 4.0 4.1  CL 104 105 104  --   --   --   --   CO2 27 25 29   < > 26 26 24   GLUCOSE 102* 92 85  < > 91 92 91  BUN 15 8 8   < > 10.9 9.6 11.9  CREATININE 0.71 0.62 0.8  < > 0.8 0.7 0.8  CALCIUM 9.7 8.7 9.8  < > 9.7 9.4 9.3  GFRNONAA 86* 90*  --   --   --   --   --   GFRAA >90 >90  --   --   --   --   --   PROT 7.1 5.7* 6.6  < > 6.9 6.4 6.8  ALBUMIN 4.0 3.0* 3.8  < > 4.0 3.8 4.1  AST 23 48* 77*  < > 18 17 16   ALT 20 41* 85*  < > 25 18 16   ALKPHOS 88 73 95  < > 83 81 78  BILITOT 0.2* 0.4 0.4  < > 0.46 0.32 0.43  < > = values in this interval not displayed.  RADIOGRAPHIC STUDIES: I reviewed the imaging with the patient I have personally reviewed the radiological images as listed and agreed with the findings in the report. Ct Chest W Contrast  08/20/2013   CLINICAL DATA:  Restaging lymphoma.  Prior chemotherapy.  EXAM: CT CHEST, ABDOMEN, AND PELVIS WITH CONTRAST  TECHNIQUE: Multidetector CT imaging of  the chest, abdomen and pelvis was performed following the standard protocol during bolus administration of intravenous contrast.  CONTRAST:  154mL OMNIPAQUE IOHEXOL 300 MG/ML  SOLN  COMPARISON:  CT ABD/PELVIS W  CM dated 05/28/2013; CT ABD/PELVIS W CM dated 08/30/2012  FINDINGS: CT CHEST FINDINGS  Bilateral axillary adenopathy. Index left axillary node on image 12 has a short axis diameter of 12 mm. Index right axillary node on image 13 has a short axis diameter of 12 mm. Borderline subcarinal lymph node has a short axis diameter of 10 mm on image 27. Other small scattered non pathologically enlarged mediastinal lymph nodes. Pretracheal lymph node on image eighteen has a short axis diameter of 7 mm. No hilar adenopathy.  Lungs are clear. No focal airspace opacities or suspicious nodules. No effusions. Heart is normal size. Aorta is normal caliber. Chest wall soft tissues are unremarkable. No acute or focal bone lesion.  CT ABDOMEN AND PELVIS FINDINGS  Prior cholecystectomy. Liver, pancreas, adrenals and kidneys are unremarkable. Tiny hypodensity centrally within the spleen on image 47 measures 5 mm, nonspecific. This is stable.  Diffusely enlarged mesenteric lymph nodes. Index right lower quadrant mesenteric lymph node on image 80 measures 8 mm in short axis diameter compared with 11 mm previously. Index left abdominal mesenteric lymph node on image 74 has a short axis diameter of 10 mm compared with 13 mm previously.  Numerous borderline sized retroperitoneal lymph nodes. Index left periaortic lymph node on image 70 has a short axis diameter of 7 mm compared with 9 mm previously. No enlarging mesenteric or retroperitoneal lymph nodes.  Extensive diverticular disease in the descending colonic and sigmoid colon. No current evidence for active diverticulitis. Previously seen inflammation adjacent to the descending colon is no longer visualized. Small bowel is decompressed.  Aorta and iliac vessels are heavily calcified, non aneurysmal. No free fluid or free air.  Uterus, adnexae and urinary bladder are unremarkable.  No acute or focal bony abnormality.  IMPRESSION: Mild bilateral axillary adenopathy. Borderline sized subcarinal  lymph node.  Innumerable borderline sized and mildly enlarged mesenteric lymph nodes, slightly decreased in overall size. Innumerable small and borderline size retroperitoneal lymph nodes, also slightly decreased. No enlarging abdominal or pelvic adenopathy.  Extensive left colonic diverticulosis.   Electronically Signed   By: Rolm Baptise M.D.   On: 08/20/2013 12:50   Ct Abdomen Pelvis W Contrast  08/20/2013   CLINICAL DATA:  Restaging lymphoma.  Prior chemotherapy.  EXAM: CT CHEST, ABDOMEN, AND PELVIS WITH CONTRAST  TECHNIQUE: Multidetector CT imaging of the chest, abdomen and pelvis was performed following the standard protocol during bolus administration of intravenous contrast.  CONTRAST:  145mL OMNIPAQUE IOHEXOL 300 MG/ML  SOLN  COMPARISON:  CT ABD/PELVIS W CM dated 05/28/2013; CT ABD/PELVIS W CM dated 08/30/2012  FINDINGS: CT CHEST FINDINGS  Bilateral axillary adenopathy. Index left axillary node on image 12 has a short axis diameter of 12 mm. Index right axillary node on image 13 has a short axis diameter of 12 mm. Borderline subcarinal lymph node has a short axis diameter of 10 mm on image 27. Other small scattered non pathologically enlarged mediastinal lymph nodes. Pretracheal lymph node on image eighteen has a short axis diameter of 7 mm. No hilar adenopathy.  Lungs are clear. No focal airspace opacities or suspicious nodules. No effusions. Heart is normal size. Aorta is normal caliber. Chest wall soft tissues are unremarkable. No acute or focal bone lesion.  CT ABDOMEN AND PELVIS FINDINGS  Prior cholecystectomy.  Liver, pancreas, adrenals and kidneys are unremarkable. Tiny hypodensity centrally within the spleen on image 47 measures 5 mm, nonspecific. This is stable.  Diffusely enlarged mesenteric lymph nodes. Index right lower quadrant mesenteric lymph node on image 80 measures 8 mm in short axis diameter compared with 11 mm previously. Index left abdominal mesenteric lymph node on image 74 has a short  axis diameter of 10 mm compared with 13 mm previously.  Numerous borderline sized retroperitoneal lymph nodes. Index left periaortic lymph node on image 70 has a short axis diameter of 7 mm compared with 9 mm previously. No enlarging mesenteric or retroperitoneal lymph nodes.  Extensive diverticular disease in the descending colonic and sigmoid colon. No current evidence for active diverticulitis. Previously seen inflammation adjacent to the descending colon is no longer visualized. Small bowel is decompressed.  Aorta and iliac vessels are heavily calcified, non aneurysmal. No free fluid or free air.  Uterus, adnexae and urinary bladder are unremarkable.  No acute or focal bony abnormality.  IMPRESSION: Mild bilateral axillary adenopathy. Borderline sized subcarinal lymph node.  Innumerable borderline sized and mildly enlarged mesenteric lymph nodes, slightly decreased in overall size. Innumerable small and borderline size retroperitoneal lymph nodes, also slightly decreased. No enlarging abdominal or pelvic adenopathy.  Extensive left colonic diverticulosis.   Electronically Signed   By: Rolm Baptise M.D.   On: 08/20/2013 12:50    ASSESSMENT & PLAN:  #1 small lymphocytic lymphoma The patient have overall stable disease. I discussed with her the risks, benefits and side effects of observation and she agreed to proceed. I would not recommend stronger systemic chemotherapy due to recurrent infection #2 recurrent diverticulitis She had multiple bouts of infection many times. She just completed a course of Augmentin 6 weeks ago. The patient had multiple medication allergies but she states she tolerated ciprofloxacin well in the past. I will give her another 10 day course of ciprofloxacin but if she does not improve, I think she need to consider surgical management #3 diffuse lymphadenopathy I reassured the patient this related to her disease #4 severe bone pain I cannot explain this on the basis of the CT  findings. The patient have fibromyalgia and currently on medications for that. I recommend vitamin D supplement. #5 recurrent interstitial cystitis She is currently on medications for that.  Orders Placed This Encounter  Procedures  . CT Chest W Contrast    Standing Status: Future     Number of Occurrences:      Standing Expiration Date: 10/27/2014    Order Specific Question:  Reason for Exam (SYMPTOM  OR DIAGNOSIS REQUIRED)    Answer:  lymphoma s/p Rx, assess response    Order Specific Question:  Preferred imaging location?    Answer:  [facility]  . CT Abdomen Pelvis W Contrast    Standing Status: Future     Number of Occurrences:      Standing Expiration Date: 11/27/2014    Order Specific Question:  Reason for Exam (SYMPTOM  OR DIAGNOSIS REQUIRED)    Answer:  lymphoma s/p Rx, assess response    Order Specific Question:  Preferred imaging location?    Answer:  [facility]  . CBC with Differential    Standing Status: Future     Number of Occurrences:      Standing Expiration Date: 08/27/2014  . Comprehensive metabolic panel    Standing Status: Future     Number of Occurrences:      Standing Expiration Date:  08/27/2014  . Lactate dehydrogenase    Standing Status: Future     Number of Occurrences:      Standing Expiration Date: 08/27/2014    All questions were answered. The patient knows to call the clinic with any problems, questions or concerns. I spent 40 minutes counseling the patient face to face. The total time spent in the appointment was 55 minutes and more than 50% was on counseling.     Heath Lark, MD 08/27/2013 12:54 PM

## 2013-08-28 ENCOUNTER — Telehealth: Payer: Self-pay | Admitting: Hematology and Oncology

## 2013-08-28 ENCOUNTER — Telehealth: Payer: Self-pay | Admitting: Physician Assistant

## 2013-08-28 NOTE — Telephone Encounter (Signed)
Spoke with patient and she saw her oncologist yesterday. She had started having abdominal pain, bloating over the weekend. Her oncologist started her on Cipro 500 mg BID for diverticulitis. (she saw Nicoletta Ba, PA 6 weeks ago for this and took Augmentin) She had a CT scan on 08/20/13 for oncology. She was told to call GI to see if she needs to take anything else. Please, advise.

## 2013-08-28 NOTE — Telephone Encounter (Signed)
Yes, reviewed CT- no definite diverticulitis. Would complete course of Cipro,also add a probiotic for a couple weeks like florastor.She should call oncology if still hurting after finishes abx(she has lymphoma)

## 2013-08-28 NOTE — Telephone Encounter (Signed)
s.w. pt and advised on OCT appt....pt ok adn aware °

## 2013-08-28 NOTE — Telephone Encounter (Signed)
Spoke with patient and gave her Amy Esterwood, PA recommendations. 

## 2013-09-04 ENCOUNTER — Telehealth: Payer: Self-pay | Admitting: Physician Assistant

## 2013-09-04 NOTE — Telephone Encounter (Signed)
Patient calling to report she is still having symptoms after the extended Cipro. Instructed patient to call Oncology as per Nicoletta Ba, PA instructions on phone note from 08/28/13.

## 2013-09-05 NOTE — Telephone Encounter (Signed)
ok 

## 2013-09-06 ENCOUNTER — Telehealth: Payer: Self-pay | Admitting: *Deleted

## 2013-09-06 NOTE — Telephone Encounter (Signed)
Pt called to say she completed the cipro prescribed by Dr Alvy Bimler. She was to call GI if she continued to have problems. Pt called GI and they told her she needed to follow up with Dr Alvy Bimler. Pt states she is better, "can stand up straight, but still swollen and is having pain with movement, can tell is not completely healed"

## 2013-09-07 ENCOUNTER — Telehealth: Payer: Self-pay | Admitting: *Deleted

## 2013-09-07 NOTE — Telephone Encounter (Signed)
Spoke with patient. Dr Alvy Bimler wants her to be seen by GI, feels this pain is GI related, can place a referral if necessary.

## 2013-09-10 ENCOUNTER — Ambulatory Visit (INDEPENDENT_AMBULATORY_CARE_PROVIDER_SITE_OTHER): Payer: Medicare Other | Admitting: Gastroenterology

## 2013-09-10 ENCOUNTER — Telehealth: Payer: Self-pay | Admitting: Gastroenterology

## 2013-09-10 ENCOUNTER — Encounter: Payer: Self-pay | Admitting: Gastroenterology

## 2013-09-10 VITALS — BP 140/80 | HR 76 | Ht 62.0 in | Wt 155.0 lb

## 2013-09-10 DIAGNOSIS — R1032 Left lower quadrant pain: Secondary | ICD-10-CM

## 2013-09-10 DIAGNOSIS — K5732 Diverticulitis of large intestine without perforation or abscess without bleeding: Secondary | ICD-10-CM

## 2013-09-10 MED ORDER — METRONIDAZOLE 500 MG PO TABS: 500.0000 mg | ORAL_TABLET | Freq: Three times a day (TID) | ORAL | Status: AC

## 2013-09-10 MED ORDER — CIPROFLOXACIN HCL 500 MG PO TABS: 500.0000 mg | ORAL_TABLET | Freq: Two times a day (BID) | ORAL | Status: DC

## 2013-09-10 NOTE — Patient Instructions (Signed)
You have a follow up visit with Dr. Deatra Ina on 10-29-2013 at 845 am.  We have sent the following medications to your pharmacy for you to pick up at your convenience: Cipro Flagyl

## 2013-09-10 NOTE — Progress Notes (Signed)
09/10/2013 Amanda Kane [id number] 1940/12/28   History of Present Illness:  Amanda Kane is a 73 year old white female known to Dr. Deatra Ina with history of diverticular disease. She also has history of interstitial cystitis, fibromyalgia, and GERD. She is currently being treated for a low-grade well-differentiated B cell lymphocytic lymphoma. She had CT scan on 05/28/2013 which showed mesenteric adenopathy persistent but without significant change, none of the nodes greater than 1.3 cm.  There was an addendum made to that report stating that there was some findings concerning for evidence of potential early or mild acute diverticulitis at that time.  I am unsure if she is treated with antibiotics at that time, but she was then seen in our office by one of our PA-Cs on February 19 with complaints of abdominal pain and was treated for diverticulitis with Augmentin for 14 days. She states that following the treatment her symptoms did get better.  She had a repeat CT scan on April 6 for restaging of her lymphoma. At that time she was seen to have diverticulosis without any diverticulitis. She states at the time of that study she was not having any further abdominal pain, however, within a week following that study she again developed abdominal pain. She was seen by her oncologist on April 13 with these complaints and was placed on Cipro 500 mg twice daily for 10 days. Her oncologist note states that if she did not improve then she may need to consider surgical management, however, the patient states that she was told to return to our office for any further issues. She is here today stating that she continues to have the same abdominal pain, however, it did improve after taking the Cipro but did not completely resolve. The pain is greatest in her left lower quadrant but she has discomfort diffusely. She states it resembles her previous episodes of diverticulitis. She admits to very minimal nausea, but no vomiting, fevers, or  poor appetite.  She is convinced that she needs prolonged courses of antibiotics (for several months at a time) because she states her sister has the same problem and that is how she is treated.  I did mention to her about surgical evaluation, however, she declined as opposed to that at this time.   Current Medications, Allergies, Past Medical History, Past Surgical History, Family History and Social History were reviewed in [employer] record.   Physical Exam: BP 140/80  Pulse 76  Ht 5\' 2"  (1.575 m)  Wt 155 lb (70.308 kg)  BMI 28.34 kg/m2 General: Well developed white female in no acute distress Head: Normocephalic and atraumatic Eyes:  Sclerae anicteric, conjunctiva pink  Ears: Normal auditory acuity Lungs: Clear throughout to auscultation Heart: Regular rate and rhythm Abdomen: Soft, non-distended.  Normal bowel sounds.  Diffuse TTP > in the LLQ without R/R/G. Musculoskeletal: Symmetrical with no gross deformities  Extremities: No edema  Neurological: Alert oriented x 4, grossly non-focal Psychological:  Alert and cooperative. Normal mood and affect  Assessment and Recommendations: #1 Abdominal pain > in the LLQ that she believes is c/w previous diverticulitis episodes.  CT scan on 4/6 did not show diverticulitis, but she was not having pain at that time. #2 low-grade well-differentiated B-cell lymphocytic lymphoma #3 interstitial cystitis #4 fibromyalgia #5 prior history of colon polyps last colonoscopy 2014 with one tubular adenoma  *We will give her another course of cipro 500 mg BID with flagyl 500 mg TID x 14 days.  She is to continue florastor  probiotic as well.  Needs follow-up with Dr. Deatra Ina in 4-6 weeks.  She is to call back in the interim if her pain returns after this course of antibiotics so we can repeat CT scan and document recurrence.  If this continues to be an issue then she may need surgical evaluation although she is opposed to the idea at  this time.  **Just of note, she has several medications listed under allergies including Cipro and Flagyl, however, she states that she is not allergic to a lot of these medicines and just gets side effects from them.

## 2013-09-10 NOTE — Telephone Encounter (Signed)
Pt states she has been seen by Nicoletta Ba PA for diverticulitis/abdominal pain and then sent back to oncology. States the oncologist have told her they are not GI docs and she needs to follow-up with GI. Pt would like to see Dr. Deatra Ina but first appt is mid-May. Pt scheduled to see Alonza Bogus PA today at 1:30pm. Pt aware of appt.

## 2013-09-11 NOTE — Progress Notes (Signed)
Reviewed and agree with management. Robert D. Kaplan, M.D., FACG  

## 2013-09-13 ENCOUNTER — Telehealth: Payer: Self-pay | Admitting: Gastroenterology

## 2013-09-13 MED ORDER — AMOXICILLIN-POT CLAVULANATE 875-125 MG PO TABS: 1.0000 | ORAL_TABLET | Freq: Two times a day (BID) | ORAL | Status: DC

## 2013-09-13 NOTE — Telephone Encounter (Signed)
Pt states that the Flagyl is making her nauseated and she has been throwing up and having diarrhea. Spoke with Alonza Bogus PA and pt is going to be switched to Augmentin. Pt aware and script sent to pharmacy.

## 2013-09-18 ENCOUNTER — Encounter: Payer: Self-pay | Admitting: Neurology

## 2013-09-18 ENCOUNTER — Ambulatory Visit (INDEPENDENT_AMBULATORY_CARE_PROVIDER_SITE_OTHER): Payer: Medicare Other | Admitting: Neurology

## 2013-09-18 VITALS — BP 130/80 | HR 78 | Temp 97.3°F | Resp 16 | Ht 62.0 in | Wt 153.3 lb

## 2013-09-18 DIAGNOSIS — M79601 Pain in right arm: Secondary | ICD-10-CM

## 2013-09-18 DIAGNOSIS — C8303 Small cell B-cell lymphoma, intra-abdominal lymph nodes: Secondary | ICD-10-CM

## 2013-09-18 DIAGNOSIS — D509 Iron deficiency anemia, unspecified: Secondary | ICD-10-CM

## 2013-09-18 DIAGNOSIS — E611 Iron deficiency: Secondary | ICD-10-CM

## 2013-09-18 DIAGNOSIS — M79609 Pain in unspecified limb: Secondary | ICD-10-CM

## 2013-09-18 DIAGNOSIS — G43009 Migraine without aura, not intractable, without status migrainosus: Secondary | ICD-10-CM

## 2013-09-18 DIAGNOSIS — IMO0001 Reserved for inherently not codable concepts without codable children: Secondary | ICD-10-CM

## 2013-09-18 DIAGNOSIS — C8583 Other specified types of non-Hodgkin lymphoma, intra-abdominal lymph nodes: Secondary | ICD-10-CM

## 2013-09-18 DIAGNOSIS — M797 Fibromyalgia: Secondary | ICD-10-CM

## 2013-09-18 MED ORDER — GABAPENTIN 100 MG PO CAPS: 200.0000 mg | ORAL_CAPSULE | Freq: Every day | ORAL | Status: AC

## 2013-09-18 NOTE — Progress Notes (Signed)
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  Records and images were personally reviewed where available.    UPDATE: She is being treated over the past couple of months with antibiotics for diverticulitis.  Headaches improved:  No longer has severe headaches.  Now, 5/10.  Duration minutes to up to 30 minutes with Tylenol or Tramadol.  Frequency 4 to 5 days per month.  She thinks that the headaches are currently related to the antibiotics.  Leg jerks:  She has longstanding history of fibromyalgia and leg pain and leg jerking.  She previously described this as discomfort in the legs and needs to get up and walk in off to relieve the discomfort, which would clinically correlate with restless leg.  However, she now reports this more likely as pain.  Also, her legs will occasionally involuntarily jerk.  This affects her sleep.  She has tried several medications over the years, most with side effects, such as gabapentin, Lyrica, Cymbalta.  She uses Vytoren gel and heat.  06/18/13 LABS:  TSH 1.273, B12 516, ferritin 10  Right arm pain:  She reports an electric-like pain from the wrist up to the shoulder.  Her hand feels numb all over.  It occurs spontaneously.  She notes it is exacerbated with certain activities such as holding a phone or driving.  She has baseline neck pain related to fibromyalgia, however there is no shooting pain from the neck radiating down the arm.  HISTORY: Onset:  One year ago (around the time she was diagnosed with Non-Hodgkin's lymphoma Location:  Bifrontal, retro-orbital, neck pain, fullness in ears Quality:  Constant dull nonthrobbing with occasional stabbing  pain Intensity:  Usually 5/10 but can fluctuate to 9-10/10 Aura: no Associated symptoms:  Nausea and vomiting (has longstanding history of nausea but possibly worse with headache), photophobia, phonophobia. Duration:  Constant but fluctuates Frequency:  daily Triggers/exacerbating factors:  none Relieving factors:  none   Past abortive therapy:  tramadol, oxycodone.  Unable to take NSAIDs due to PUD.  Tylenol (upsets stomach and ineffective), Imitrex (made her sick but would not use triptans due to history of cerebrovascular disease).  Unable to take NSAIDs due to PUD.  Unable to take Excedrin (makes sick).    Past preventative therapy:  Gabapentin.  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  There is also chronic small vessel ischemic changes.  No acute pathology or abnormal enhancement. Due to these results, she had a stroke workup.  2D Echo revealed LVEF 60-65% with mild focal basal septal hypertrophy.  Carotid doppler revealed 1-39% bilateral ICA stenosis.  05/01/13 EKG:  NSR, QT/QTc 394/418.   PAST MEDICAL HISTORY: Past Medical History  Diagnosis Date  . Arthritis   . Esophageal reflux   . Myalgia and myositis, unspecified   . Pure hypercholesterolemia   . Unspecified hypothyroidism   . Colonic polyp     last one around 2008.  Due one now.   . Interstitial cystitis   . Hypertension   . PUD (  peptic ulcer disease)   . MVP (mitral valve prolapse)   . Adenopathy 02/25/2012  . Diverticulosis     past diverticulitis; last 2 years ago. no resection.   . Fibromyalgia   . Lymphadenopathy, abdominal 09/01/2012  . Allergy   . Blood transfusion without reported diagnosis   . Cancer     malginant polyps colono 20 years ago  . Eczema      Hx: of  . H/O hiatal hernia   . Headache(784.0)     Hx: of   . 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 History of cancer chemotherapy     MEDICATIONS: Current Outpatient Prescriptions on File Prior to Visit  Medication Sig Dispense Refill  . amoxicillin-clavulanate (AUGMENTIN) 875-125 MG per tablet Take 1 tablet by mouth 2 (two) times daily.  28 tablet  0  . aspirin 81 MG tablet Take 81 mg by mouth daily.      . ciprofloxacin (CIPRO) 500 MG tablet Take 1 tablet (500 mg total) by mouth 2 (two) times daily.  30 tablet  0  . FAMOTIDINE PO Take 1 tablet by mouth daily as needed (acid reflux).      Marland Kitchen FLUoxetine (PROZAC) 20 MG capsule Take 1 capsule by mouth every other day.       . losartan (COZAAR) 50 MG tablet Take 1 tablet (50 mg total) by mouth daily.  90 tablet  3  . metroNIDAZOLE (FLAGYL) 500 MG tablet Take 1 tablet (500 mg total) by mouth 3 (three) times daily.  42 tablet  0  . Multiple Vitamins-Minerals (MULTIVITAMIN PO) Take 1 tablet by mouth daily.      . pentosan polysulfate (ELMIRON) 100 MG capsule Take 100 mg by mouth as needed.      . phenazopyridine (PYRIDIUM) 100 MG tablet Take 1 tablet (100 mg total) by mouth 3 (three) times daily as needed (urinary pain).  10 tablet  1  . SYNTHROID 75 MCG tablet TAKE 1 TABLET (75 MCG TOTAL) BY MOUTH DAILY.  90 tablet  0  . traMADol (ULTRAM) 50 MG tablet Take 1 tab every 4-6 hours as needed for pain.  30 tablet  0   No current facility-administered medications on file prior to visit.    ALLERGIES: Allergies  Allergen Reactions  . Amlodipine     headache  . Atenolol     Headache   . Atorvastatin Other (See Comments)    "joints ache."  . Celecoxib Other (See Comments)    "burns my stomach."  . Ciprofloxacin Itching  . Ezetimibe     REACTION: Aches  . Hctz [Hydrochlorothiazide]     headache  . Lisinopril Cough  . Losartan     headache  . Macrobid [Nitrofurantoin Macrocrystal] Itching  .  Metronidazole   . Pregabalin     REACTION: Numb  . Simvastatin     REACTION: Aches  . Sulfonamide Derivatives Itching  . Verapamil Other (See Comments)    Migraine headache    FAMILY HISTORY: Family History  Problem Relation Age of Onset  . Coronary artery disease Brother     MI at age 37  . Heart failure Mother   . Stroke Father   . Stomach cancer Paternal Grandmother   . Rectal cancer Neg Hx   . Esophageal cancer Neg Hx   . Colon cancer Neg Hx     SOCIAL HISTORY: History   Social History  . Marital Status: Married    Spouse Name:  N/A    Number of Children: 2  . Years of Education: N/A   Occupational History  . Retired     retired Pharmacist, hospital   Social History Main Topics  . Smoking status: Never Smoker   . Smokeless tobacco: Never Used  . Alcohol Use: Yes     Comment: Glass wine per night  . Drug Use: No  . Sexual Activity: Yes   Other Topics Concern  . Not on file   Social History Narrative  . No narrative on file    REVIEW OF SYSTEMS: Constitutional: No fevers, chills, or sweats, no generalized fatigue, change in appetite Eyes: No visual changes, double vision, eye pain Ear, nose and throat: No hearing loss, ear pain, nasal congestion, sore throat Cardiovascular: No chest pain, palpitations Respiratory:  No shortness of breath at rest or with exertion, wheezes GastrointestinaI: Nausea, sometimes vomiting Genitourinary:  No dysuria, urinary retention or frequency Musculoskeletal:  Diffuse pain including neck, shoulders, legs Integumentary: No rash, pruritus, skin lesions Neurological: as above Psychiatric: No depression, insomnia, anxiety Endocrine: No palpitations, fatigue, diaphoresis, mood swings, change in appetite, change in weight, increased thirst Hematologic/Lymphatic:  No anemia, purpura, petechiae. Allergic/Immunologic: no itchy/runny eyes, nasal congestion, recent allergic reactions, rashes  PHYSICAL EXAM: Filed Vitals:   09/18/13 1333    BP: 130/80  Pulse: 78  Temp: 97.3 F (36.3 C)  Resp: 16   General: No acute distress Head:  Normocephalic/atraumatic Neck: supple, bilateral tenderness, full range of motion Heart:  Regular rate and rhythm Lungs:  Clear to auscultation bilaterally Back: No paraspinal tenderness Neurological Exam: alert and oriented to person, place, and time. Attention span and concentration intact, recent and remote memory intact, fund of knowledge intact.  Speech fluent and not dysarthric, language intact.  CN II-XII intact. Fundoscopic exam unremarkable without vessel changes, exudates, hemorrhages or papilledema.  Bulk and tone normal, muscle strength 5/5 throughout.  Sensation to temperature and vibration reduced in feet.  Deep tendon reflexes 2+ throughout, toes downgoing.  Finger to nose intact.  Gait normal.  IMPRESSION: Migraine headache Leg pain:  Not quite restless leg on repeated history.  May be idiopathic neuropathy or manifestation of fibromyalgia.  She does have a low ferritin, which may somewhat be playing a role. Right arm pain.  Cervical radiculopathy vs carpal tunnel.  PLAN: 1.  Headaches are well-controlled at this time.   2.  For the arm, wants to try wrist splints first.  If ineffective, will call and we can send to PT.  Future options, if not resolved, would be to get NCV/EMG 3.  Would defer treating iron deficiency as per PCP or oncology 4.  Follow up in 3 months.  30 minutes spent with patient, over 50% spent counseling and coordinating care.  Metta Clines, DO  CC:  Loura Pardon, MD  Heath Lark, MD

## 2013-09-18 NOTE — Patient Instructions (Signed)
1.  Try taking gabapentin at bedtime.  I wrote for 100mg  tablets.  Try taking 1 tablet at bedtime first.  May increase to 2 tablets at bedtime if needed. 2.  Try using a wrist splint on your right wrist to see if it may be carpal tunnel.  You can get one at any drug store.  If not effective, call and we can set up for physical therapy 3.  Your ferritin level (measure for iron) is low.  It may partially play a role in your leg discomfort.  Discuss with your oncologist or primary care physician regarding supplementation. 4.  Follow up in 3 months.

## 2013-09-19 ENCOUNTER — Telehealth: Payer: Self-pay | Admitting: *Deleted

## 2013-09-19 ENCOUNTER — Encounter: Payer: Self-pay | Admitting: *Deleted

## 2013-09-19 ENCOUNTER — Other Ambulatory Visit: Payer: Self-pay | Admitting: Hematology and Oncology

## 2013-09-19 DIAGNOSIS — D509 Iron deficiency anemia, unspecified: Secondary | ICD-10-CM

## 2013-09-19 NOTE — Telephone Encounter (Signed)
Spoke with patient and told her Dr Alvy Bimler says it is OK to take OTC oral iron daily. She will recheck labs at next appointment.

## 2013-10-29 ENCOUNTER — Encounter: Payer: Self-pay | Admitting: Gastroenterology

## 2013-10-29 ENCOUNTER — Ambulatory Visit (INDEPENDENT_AMBULATORY_CARE_PROVIDER_SITE_OTHER): Payer: Medicare Other | Admitting: Gastroenterology

## 2013-10-29 VITALS — BP 132/80 | HR 84 | Ht 62.0 in | Wt 152.4 lb

## 2013-10-29 DIAGNOSIS — K5732 Diverticulitis of large intestine without perforation or abscess without bleeding: Secondary | ICD-10-CM

## 2013-10-29 MED ORDER — MESALAMINE 1.2 G PO TBEC: 2.4000 g | DELAYED_RELEASE_TABLET | Freq: Every day | ORAL | Status: AC

## 2013-10-29 MED ORDER — HYOSCYAMINE SULFATE ER 0.375 MG PO TBCR: EXTENDED_RELEASE_TABLET | ORAL | Status: AC

## 2013-10-29 NOTE — Progress Notes (Signed)
          History of Present Illness:  Patient has returned for followup of abdominal pain.  With another course of antibiotics pain subsided.  She now has some mild low-grade discomfort and cramping.  She's had recurrent diverticulitis over the years.  Last CT scan in April was negative for acute diverticulitis but her prior exam from August was positive.    Review of Systems: Pertinent positive and negative review of systems were noted in the above HPI section. All other review of systems were otherwise negative.    Current Medications, Allergies, Past Medical History, Past Surgical History, Family History and Social History were reviewed in [place] record  Vital signs were reviewed in today's medical record. Physical Exam: General: Well developed , well nourished, no acute distress Skin: anicteric   See Assessment and Plan under Problem List

## 2013-10-29 NOTE — Patient Instructions (Signed)
We will send in a prescription to your pharmacy Follow up as needed

## 2013-10-29 NOTE — Assessment & Plan Note (Signed)
Patient has had acute diverticulitis multiple times over the years and most recently a prolonged course for which she took several antibiotics.  CT in April and [place], 2015 changes of acute diverticulitis but symptoms acid.  I suspect that she has a low-grade inflammation without frank infection.  Patient will be moving in a month to [place].  Elective surgery was recommended in the past but the patient is currently not interested  Recommendations #1 trial of Lialda 2.4 g daily on the possibility of underlying inflammation #2 hyomax when necessary

## 2013-11-05 ENCOUNTER — Telehealth: Payer: Self-pay | Admitting: *Deleted

## 2013-11-05 ENCOUNTER — Telehealth: Payer: Self-pay | Admitting: Gastroenterology

## 2013-11-05 NOTE — Telephone Encounter (Signed)
Pt states she was given Lialda and Hyomax to take last Monday. Pt states that the Lialda she cannot take. States it makes her stomach hurt and she was passing some blood in her stool with this. Pt states she stopped taking the Lialda, feels she cannot take this because it just made the symptoms she was having worse. Pt states she does take the hyomax when she is having cramping. Please advise.

## 2013-11-05 NOTE — Telephone Encounter (Signed)
Discontinue lialda. If she's having pain again?  Is rectal bleeding new?

## 2013-11-05 NOTE — Telephone Encounter (Signed)
Be sure she takes hyomax 0.375 mg every 8 hours and have her call back tomorrow

## 2013-11-05 NOTE — Telephone Encounter (Signed)
Pt left a message stating she is moving to [place] and wants to know if Dr Alvy Bimler has any recommendations for an oncologist in [place]. Also will need copies of records.

## 2013-11-05 NOTE — Telephone Encounter (Signed)
Pt has already stopped taking the Lialda, stated that she could not take it. She is still having abdominal pain and cramping, the blood was new.

## 2013-11-06 ENCOUNTER — Telehealth: Payer: Self-pay | Admitting: Nurse Practitioner

## 2013-11-06 NOTE — Telephone Encounter (Signed)
RN called patient in response to previous message regarding her records. RN informed patient to call [facility] radiology department at [phone] to request copies of scans on CD. She was also informed to call our office when she makes appt with oncologist in [place] so we can send all appropriate information/records to new facility. Patient verbalized understanding of the above and thanked for the information.

## 2013-11-06 NOTE — Telephone Encounter (Signed)
Spoke with pt and she is aware. States she already feels better since stopping the Lialda, cramping and diarrhea are better.

## 2013-11-08 ENCOUNTER — Other Ambulatory Visit: Payer: Self-pay | Admitting: Family Medicine

## 2013-11-27 ENCOUNTER — Telehealth: Payer: Self-pay | Admitting: *Deleted

## 2013-11-27 NOTE — Telephone Encounter (Signed)
Pt left VM states she has moved to [place] and her My Chart account has been cancelled.  She wants her account re instated so she can look up records and communicate with her physicians here if needed.  Message given to Amy in [place] office.  Pt's phone number is [phone].

## 2013-11-29 ENCOUNTER — Telehealth: Payer: Self-pay | Admitting: Neurology

## 2013-11-29 NOTE — Telephone Encounter (Signed)
Pt cancelled 12/19/13 follow up appt, she has moved to [place] / [place] S.

## 2013-12-19 ENCOUNTER — Ambulatory Visit: Payer: Medicare Other | Admitting: Neurology

## 2014-02-18 ENCOUNTER — Ambulatory Visit (HOSPITAL_COMMUNITY): Payer: Medicare Other

## 2014-02-18 ENCOUNTER — Other Ambulatory Visit: Payer: Private Health Insurance - Indemnity

## 2014-02-25 ENCOUNTER — Ambulatory Visit: Payer: Private Health Insurance - Indemnity | Admitting: Hematology and Oncology

## 2014-09-08 NOTE — Op Note (Signed)
PATIENT NAME:  Amanda Kane, Amanda Kane MR#:  [id number] DATE OF BIRTH:  1940/11/19  DATE OF PROCEDURE:  05/28/2011  PREOPERATIVE DIAGNOSIS: Internal derangement of the left knee.   POSTOPERATIVE DIAGNOSES:  1. Tear of the posterior horn of medial meniscus, left knee.  2. 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   PROCEDURES PERFORMED:  1. Left knee arthroscopy.  2. Partial medial meniscectomy.  3. Chondroplasty of patellofemoral articulation.   SURGEON: Laurice Record. Holley Bouche., MD   ANESTHESIA: General.   ESTIMATED BLOOD LOSS: Minimal.   TOURNIQUET TIME: Not used.   DRAINS: None.   INDICATIONS FOR SURGERY: The patient is a 74 year old female who has been seen for complaints of left knee pain. MRI demonstrated findings consistent with meniscal pathology. After discussion of the risks and benefits of surgical intervention, the patient expressed her understanding of the risks and benefits and agreed with plans for surgical intervention.   PROCEDURE IN DETAIL: The patient was brought in the operating room and, after adequate general anesthesia was achieved, a tourniquet was placed on the patient's upper left thigh and leg and right leg was placed in a legholder. All bony prominences were well padded. The left knee and leg were cleaned and prepped with alcohol and DuraPrep and draped in the usual sterile fashion. A "time-out" was performed as per usual protocol. The anticipated portal sites were injected with 0.25% Marcaine with epinephrine. An anterolateral portal was created and cannula was inserted. A small effusion was evacuated. Scope was distended with fluid using the DePuy Mitek pump. Scope was advanced down the medial gutter into the medial compartment of the knee. Under visualization with the scope, an anteromedial portal was created and hook probe was inserted. Inspection of the medial compartment demonstrates complex tear of the posterior horn of the medial meniscus.  Undersurface flap had flipped underneath the meniscus. The area of the tear was debrided using a combination of meniscal punches and a 4.5 mm shaver. Final contouring was performed using the 50 degree ArthroCare wand. Remaining rim of meniscus was visualized and probed and felt to be stable. Anterior horn of the medial meniscus was visualized and probed and felt to be stable. Only minor chondral changes were appreciated. Scope was then advanced into the intercondylar region. Anterior cruciate ligament was visualized and probed and felt to be stable. Lachman's test was performed under direct visualization with good function of the ACL noted. Scope was removed from the anterolateral portal and reinserted via the interomedial portal so as to better visualize the lateral compartment. Articular surface was in good condition. Lateral meniscus was visualized and probed and felt to be stable. Finally, the scope was positioned so as to visualize the patellofemoral articulation. Small area of grade III to IV chondromalacia was noted along the lateral facet with lesser changes noted to the lateral femoral condyle. The areas involved were debrided and contoured using the ArthroCare wand.   The knee was irrigated with copious amounts of fluid and then suctioned dry. The anterolateral portal was reapproximated using #3-0 nylon. A combination of 0.25% Marcaine with epinephrine and 4 mg morphine was injected via the scope. The scope was removed and the anteromedial portal was reapproximated using #3-0 nylon. Sterile dressing was applied followed by application of ice wrap.   The patient tolerated the procedure well. She was transported to the recovery room in stable condition.   ____________________________ Laurice Record. Holley Bouche., MD jph:drc D: 05/29/2011 00:34:33 ET T: 05/29/2011 10:40:40 ET JOB#: [id number]  cc: Jeneen Rinks  Evalina Field., MD, <Dictator> Laurice Record Holley Bouche MD ELECTRONICALLY SIGNED 05/29/2011 14:38

## 2015-01-15 IMAGING — NM NM HEPATO W/GB/PHARM/[PERSON_NAME]
2 series · 12 of 12 positions shown · non-contrast
Comparison: CT scan dated 08/30/2012

CLINICAL DATA: Abdominal pain and fatty food intolerance.
Cholelithiasis.

NUCLEAR MEDICINE HEPATOBILIARY WITH GB, PHARM AND QUAN MEASURE
Radiopharmaceutical:  5 mCi technetium Choletec IV.
1.37 mcg CCK IV.  The patient had abdominal pain during CCK
administration.

[Series 0: hepato · 3.20mm/px · 6 of 58 frames shown (1 of 2)]
[frame 5/58]
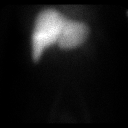
[frame 15/58]
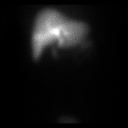
[frame 25/58]
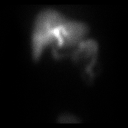
[frame 34/58]
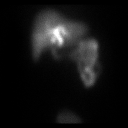
[frame 44/58]
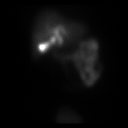
[frame 54/58]
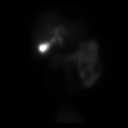

[Series 0: hepato · 3.20mm/px · 6 of 30 frames shown (2 of 2)]
[frame 3/30]
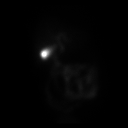
[frame 8/30]
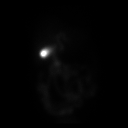
[frame 13/30]
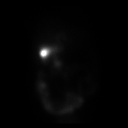
[frame 18/30]
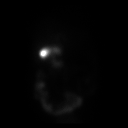
[frame 23/30]
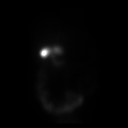
[frame 28/30]
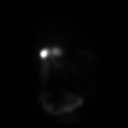

[12 of 12 positions shown; findings below may reference images not displayed]

FINDINGS: The gallbladder was visualized at 35 minutes.  Activity
was seen in the bowel at 15 minutes.

Gallbladder ejection fraction was 18.8% at 27 minutes.
IMPRESSION: 1.  Biliary dyskinesia with abnormally low ejection fraction of
18.8%.

## 2015-01-15 IMAGING — NM NM HEPATO W/GB/PHARM/[PERSON_NAME]
1 series · 1 of 1 positions shown · non-contrast
Comparison: CT scan dated 08/30/2012

CLINICAL DATA: Abdominal pain and fatty food intolerance.
Cholelithiasis.

NUCLEAR MEDICINE HEPATOBILIARY WITH GB, PHARM AND QUAN MEASURE
Radiopharmaceutical:  5 mCi technetium Choletec IV.
1.37 mcg CCK IV.  The patient had abdominal pain during CCK
administration.

[hepato · 1 of 1 slices shown]
[im 1/1]
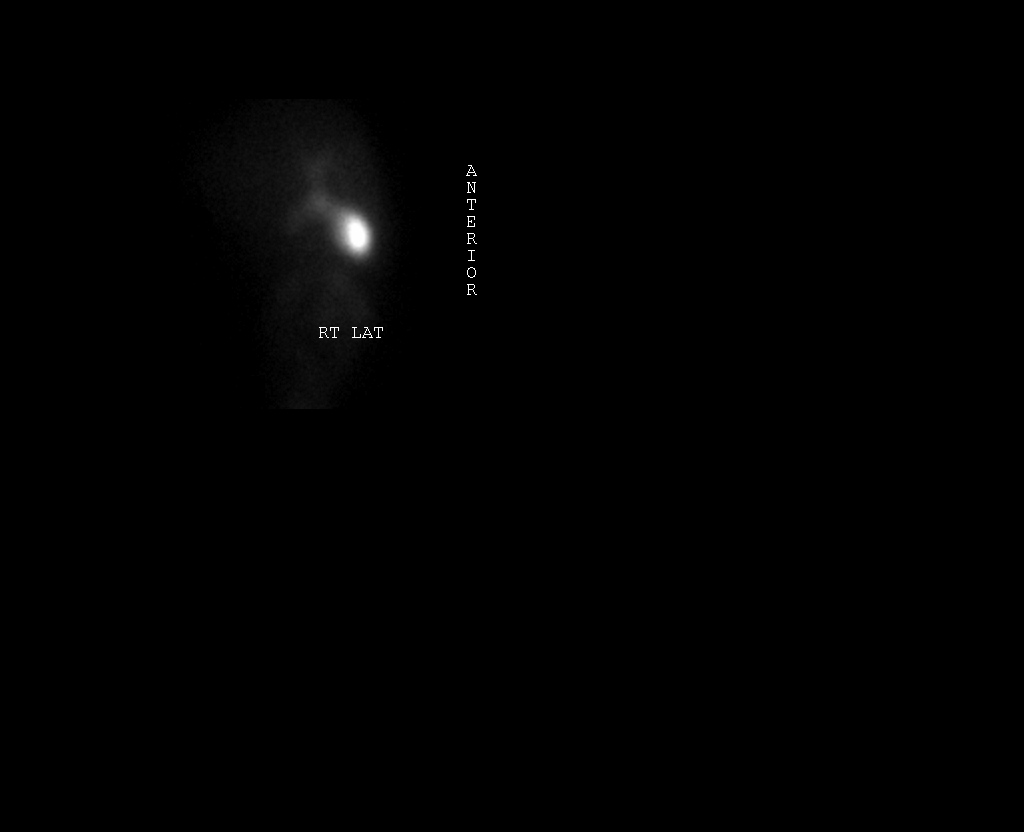

[1 of 1 positions shown; findings below may reference images not displayed]

FINDINGS: The gallbladder was visualized at 35 minutes.  Activity
was seen in the bowel at 15 minutes.

Gallbladder ejection fraction was 18.8% at 27 minutes.
IMPRESSION: 1.  Biliary dyskinesia with abnormally low ejection fraction of
18.8%.

## 2015-05-30 ENCOUNTER — Encounter: Payer: Self-pay | Admitting: Gastroenterology

## 2015-08-15 ENCOUNTER — Encounter (HOSPITAL_COMMUNITY): Payer: Self-pay
